# Patient Record
Sex: Male | Born: 1960 | Race: White | Hispanic: No | Marital: Single | Smoking: Never smoker
Health system: Southern US, Community
[De-identification: ages and names within clinical notes are randomized; demographics above are authoritative.]

## PROBLEM LIST (undated history)

## (undated) DIAGNOSIS — G4730 Sleep apnea, unspecified: Secondary | ICD-10-CM

## (undated) DIAGNOSIS — I10 Essential (primary) hypertension: Secondary | ICD-10-CM

## (undated) DIAGNOSIS — I509 Heart failure, unspecified: Secondary | ICD-10-CM

## (undated) DIAGNOSIS — N189 Chronic kidney disease, unspecified: Secondary | ICD-10-CM

---

## 2017-09-01 ENCOUNTER — Encounter (HOSPITAL_COMMUNITY): Payer: Self-pay | Admitting: Emergency Medicine

## 2017-09-01 ENCOUNTER — Emergency Department (HOSPITAL_COMMUNITY)
Admission: EM | Admit: 2017-09-01 | Discharge: 2017-09-01 | Disposition: A | Discharge status: Home / Self Care | Payer: BLUE CROSS/BLUE SHIELD | Attending: Emergency Medicine | Admitting: Emergency Medicine

## 2017-09-01 DIAGNOSIS — R040 Epistaxis: Secondary | ICD-10-CM | POA: Diagnosis present

## 2017-09-01 LAB — BASIC METABOLIC PANEL
Anion gap: 10 (ref 5–15)
BUN: 35 mg/dL — ABNORMAL HIGH (ref 6–20)
CHLORIDE: 103 mmol/L (ref 101–111)
CO2: 24 mmol/L (ref 22–32)
CREATININE: 1.26 mg/dL — AB (ref 0.61–1.24)
Calcium: 8.2 mg/dL — ABNORMAL LOW (ref 8.9–10.3)
GFR calc non Af Amer: >60 mL/min (ref >60)
GLUCOSE: 140 mg/dL — AB (ref 65–99)
Potassium: 4.4 mmol/L (ref 3.5–5.1)
Sodium: 137 mmol/L (ref 135–145)

## 2017-09-01 LAB — CBC WITH DIFFERENTIAL/PLATELET
Basophils Absolute: 0.1 10*3/uL (ref 0.0–0.1)
Basophils Relative: 1 %
EOS ABS: 0.2 10*3/uL (ref 0.0–0.7)
Eosinophils Relative: 2 %
HEMATOCRIT: 39.3 % (ref 39.0–52.0)
HEMOGLOBIN: 13.1 g/dL (ref 13.0–17.0)
LYMPHS ABS: 1 10*3/uL (ref 0.7–4.0)
Lymphocytes Relative: 11 %
MCH: 29.6 pg (ref 26.0–34.0)
MCHC: 33.3 g/dL (ref 30.0–36.0)
MCV: 88.9 fL (ref 78.0–100.0)
MONO ABS: 0.4 10*3/uL (ref 0.1–1.0)
MONOS PCT: 4 %
NEUTROS ABS: 7.7 10*3/uL (ref 1.7–7.7)
NEUTROS PCT: 82 %
Platelets: 193 10*3/uL (ref 150–400)
RBC: 4.42 MIL/uL (ref 4.22–5.81)
RDW: 14.1 % (ref 11.5–15.5)
WBC: 9.3 10*3/uL (ref 4.0–10.5)

## 2017-09-01 LAB — PROTIME-INR
INR: 1.05
Prothrombin Time: 13.6 seconds (ref 11.4–15.2)

## 2017-09-01 MED ORDER — CEPHALEXIN 500 MG PO CAPS
500.0000 mg | ORAL_CAPSULE | Freq: Two times a day (BID) | ORAL | 0 refills | Status: DC
Start: 2017-09-01 — End: 2019-05-03

## 2017-09-01 MED ORDER — LIDOCAINE-EPINEPHRINE (PF) 2 %-1:200000 IJ SOLN
10.0000 mL | Freq: Once | INTRAMUSCULAR | Status: AC
  Administered 2017-09-01: 10 mL
  Filled 2017-09-01: qty 20

## 2017-09-01 NOTE — ED Triage Notes (Addendum)
Patient arrived with EMS from home reports epistaxis this morning 2am , denies nasal injury / no anticoagulation , actively bleeding at arrival with blood clots at nares and mouth . Pt. stated 3 alcoholic drinks last night.

## 2017-09-01 NOTE — ED Notes (Signed)
Pt verbalizes understanding of d/c instructions. Pt received prescriptions. Pt ambulatory at d/c with all belongings.  

## 2017-09-01 NOTE — ED Provider Notes (Signed)
[place] EMERGENCY DEPARTMENT Provider Note   CSN: [id number] Arrival date & time: 09/01/17  0353     History   Chief Complaint Chief Complaint  Patient presents with  . Epistaxis    HPI Aaron Hale is a 56 y.o. male.  The history is provided by the patient and a significant other.  Epistaxis   This is a new problem. The current episode started 2 days ago. The problem occurs daily. The problem has been rapidly worsening. The problem is associated with an unknown factor. The bleeding has been from both nares. He has tried nothing for the symptoms.   Patient arrives via EMS for epistaxis that began 2 days ago but worsened tonight He has never had this before He does not take anticoagulants He reports feeling dizzy recently, no other acute complaints   PMH -none Home Medications    Prior to Admission medications   Medication Sig Start Date End Date Taking? Authorizing Provider  cephALEXin (KEFLEX) 500 MG capsule Take 1 capsule (500 mg total) by mouth 2 (two) times daily. 09/01/17   Ripley Fraise, MD    Family History No family history on file.  Social History Social History   Tobacco Use  . Smoking status: Never Smoker  . Smokeless tobacco: Never Used  Substance Use Topics  . Alcohol use: Yes  . Drug use: No     Allergies   Patient has no known allergies.   Review of Systems Review of Systems  Constitutional: Negative for fever.  HENT: Positive for nosebleeds.   Gastrointestinal: Negative for vomiting.  Neurological: Positive for dizziness.  All other systems reviewed and are negative.    Physical Exam Updated Vital Signs BP (!) 168/121 (BP Location: Right Arm)   Pulse (!) 105   Temp (!) 97.2 F (36.2 C) (Temporal)   Resp 18   Ht 1.829 m (6')   Wt (!) 163.3 kg (360 lb)   SpO2 92%   BMI 48.82 kg/m   Physical Exam CONSTITUTIONAL: Disheveled HEAD: Normocephalic/atraumatic EYES: EOMI/PERRL ENMT: Mucous membranes  moist, patient has blood in both nares with copious amount of bleeding from right nare.  He also has large amount of blood in his mouth NECK: supple no meningeal signs LUNGS: no apparent distress ABDOMEN: soft, obese NEURO: Pt is awake/alert/appropriate, moves all extremitiesx4.  No facial droop.   EXTREMITIES:  full ROM SKIN: warm, color normal PSYCH: no abnormalities of mood noted, alert and oriented to situation   ED Treatments / Results  Labs (all labs ordered are listed, but only abnormal results are displayed) Labs Reviewed  BASIC METABOLIC PANEL - Abnormal; Notable for the following components:      Result Value   Glucose, Bld 140 (*)    BUN 35 (*)    Creatinine, Ser 1.26 (*)    Calcium 8.2 (*)    All other components within normal limits  CBC WITH DIFFERENTIAL/PLATELET  PROTIME-INR    EKG  EKG Interpretation None       Radiology No results found.  Procedures .Epistaxis Management Date/Time: 09/01/2017 5:45 AM Performed by: Ripley Fraise, MD Authorized by: Ripley Fraise, MD   Consent:    Consent obtained:  Verbal   Consent given by:  Patient Procedure details:    Treatment site:  R anterior and R posterior   Treatment method:  Anterior pack   Treatment complexity:  Extensive   Treatment episode: initial   Post-procedure details:    Assessment:  Bleeding stopped  Patient tolerance of procedure:  Tolerated well, no immediate complications Comments:     7.5 rapid Rhino rocket to treat potential anterior posterior bleed applied without any difficulty   (including critical care time)  Medications Ordered in ED Medications  lidocaine-EPINEPHrine (XYLOCAINE W/EPI) 2 %-1:200000 (PF) injection 10 mL (10 mLs Other Given 09/01/17 0453)     Initial Impression / Assessment and Plan / ED Course  I have reviewed the triage vital signs and the nursing notes.  Pertinent labs results that were available during my care of the patient were reviewed by me  and considered in my medical decision making (see chart for details).     Patient with significant amount of bleeding on my arrival to the room I applied lidocaine with epi and pressure immediately I suctioned the nose then applied Rhino Rocket The bleeding stopped almost immediately after application of right rapid Rhino rocket Blood was suctioned from left nare and no further bleeding from that side Patient now is awake alert no distress no active bleeding Labs Reassuring  Referred to ear nose and throat next week and will start on Keflex for antibiotic prophylaxis  Final Clinical Impressions(s) / ED Diagnoses   Final diagnoses:  Epistaxis    ED Discharge Orders        Ordered    cephALEXin (KEFLEX) 500 MG capsule  2 times daily     09/01/17 0649       Ripley Fraise, MD 09/01/17 [phone]

## 2017-09-01 NOTE — ED Notes (Signed)
Dr. Christy Gentles ( EDP) at bedside.

## 2017-09-01 NOTE — ED Notes (Signed)
ENT cart set up at bedside .

## 2017-09-06 ENCOUNTER — Ambulatory Visit (INDEPENDENT_AMBULATORY_CARE_PROVIDER_SITE_OTHER): Payer: BLUE CROSS/BLUE SHIELD | Admitting: Otolaryngology

## 2017-09-06 DIAGNOSIS — R040 Epistaxis: Secondary | ICD-10-CM | POA: Diagnosis not present

## 2019-05-03 ENCOUNTER — Emergency Department: Payer: Self-pay

## 2019-05-03 ENCOUNTER — Other Ambulatory Visit: Payer: Self-pay

## 2019-05-03 ENCOUNTER — Inpatient Hospital Stay: Payer: Self-pay

## 2019-05-03 ENCOUNTER — Encounter: Payer: Self-pay | Admitting: Emergency Medicine

## 2019-05-03 ENCOUNTER — Inpatient Hospital Stay
Admission: EM | Admit: 2019-05-03 | Discharge: 2019-05-06 | DRG: 871 | Disposition: A | Discharge status: Home Health | Payer: Self-pay | Attending: Internal Medicine | Admitting: Internal Medicine

## 2019-05-03 DIAGNOSIS — Z79899 Other long term (current) drug therapy: Secondary | ICD-10-CM

## 2019-05-03 DIAGNOSIS — Z6841 Body Mass Index (BMI) 40.0 and over, adult: Secondary | ICD-10-CM

## 2019-05-03 DIAGNOSIS — I110 Hypertensive heart disease with heart failure: Secondary | ICD-10-CM | POA: Diagnosis present

## 2019-05-03 DIAGNOSIS — Z20828 Contact with and (suspected) exposure to other viral communicable diseases: Secondary | ICD-10-CM | POA: Diagnosis present

## 2019-05-03 DIAGNOSIS — J9601 Acute respiratory failure with hypoxia: Secondary | ICD-10-CM | POA: Diagnosis present

## 2019-05-03 DIAGNOSIS — N179 Acute kidney failure, unspecified: Secondary | ICD-10-CM

## 2019-05-03 DIAGNOSIS — L03119 Cellulitis of unspecified part of limb: Secondary | ICD-10-CM

## 2019-05-03 DIAGNOSIS — L03115 Cellulitis of right lower limb: Secondary | ICD-10-CM | POA: Diagnosis present

## 2019-05-03 DIAGNOSIS — I5033 Acute on chronic diastolic (congestive) heart failure: Secondary | ICD-10-CM | POA: Diagnosis present

## 2019-05-03 DIAGNOSIS — A419 Sepsis, unspecified organism: Principal | ICD-10-CM

## 2019-05-03 DIAGNOSIS — I50811 Acute right heart failure: Secondary | ICD-10-CM

## 2019-05-03 DIAGNOSIS — L03116 Cellulitis of left lower limb: Secondary | ICD-10-CM | POA: Diagnosis present

## 2019-05-03 DIAGNOSIS — R609 Edema, unspecified: Secondary | ICD-10-CM

## 2019-05-03 DIAGNOSIS — Z8249 Family history of ischemic heart disease and other diseases of the circulatory system: Secondary | ICD-10-CM

## 2019-05-03 DIAGNOSIS — L0390 Cellulitis, unspecified: Secondary | ICD-10-CM

## 2019-05-03 HISTORY — DX: Heart failure, unspecified: I50.9

## 2019-05-03 HISTORY — DX: Essential (primary) hypertension: I10

## 2019-05-03 HISTORY — DX: Morbid (severe) obesity due to excess calories: E66.01

## 2019-05-03 LAB — COMPREHENSIVE METABOLIC PANEL
ALT: 29 U/L (ref 0–44)
AST: 25 U/L (ref 15–41)
Albumin: 3.7 g/dL (ref 3.5–5.0)
Alkaline Phosphatase: 42 U/L (ref 38–126)
Anion gap: 12 (ref 5–15)
BUN: 49 mg/dL — ABNORMAL HIGH (ref 6–20)
CO2: 25 mmol/L (ref 22–32)
Calcium: 8.7 mg/dL — ABNORMAL LOW (ref 8.9–10.3)
Chloride: 97 mmol/L — ABNORMAL LOW (ref 98–111)
Creatinine, Ser: 2.21 mg/dL — ABNORMAL HIGH (ref 0.61–1.24)
GFR calc Af Amer: 37 mL/min — ABNORMAL LOW (ref >60)
GFR calc non Af Amer: 32 mL/min — ABNORMAL LOW (ref >60)
Glucose, Bld: 123 mg/dL — ABNORMAL HIGH (ref 70–99)
Potassium: 4 mmol/L (ref 3.5–5.1)
Sodium: 134 mmol/L — ABNORMAL LOW (ref 135–145)
Total Bilirubin: 1 mg/dL (ref 0.3–1.2)
Total Protein: 8 g/dL (ref 6.5–8.1)

## 2019-05-03 LAB — CBC WITH DIFFERENTIAL/PLATELET
Abs Immature Granulocytes: 0.07 10*3/uL (ref 0.00–0.07)
Basophils Absolute: 0.1 10*3/uL (ref 0.0–0.1)
Basophils Relative: 1 %
Eosinophils Absolute: 0.1 10*3/uL (ref 0.0–0.5)
Eosinophils Relative: 0 %
HCT: 45.9 % (ref 39.0–52.0)
Hemoglobin: 15.1 g/dL (ref 13.0–17.0)
Immature Granulocytes: 1 %
Lymphocytes Relative: 6 %
Lymphs Abs: 0.8 10*3/uL (ref 0.7–4.0)
MCH: 28.2 pg (ref 26.0–34.0)
MCHC: 32.9 g/dL (ref 30.0–36.0)
MCV: 85.6 fL (ref 80.0–100.0)
Monocytes Absolute: 1.6 10*3/uL — ABNORMAL HIGH (ref 0.1–1.0)
Monocytes Relative: 11 %
Neutro Abs: 12 10*3/uL — ABNORMAL HIGH (ref 1.7–7.7)
Neutrophils Relative %: 81 %
Platelets: 198 10*3/uL (ref 150–400)
RBC: 5.36 MIL/uL (ref 4.22–5.81)
RDW: 14.8 % (ref 11.5–15.5)
WBC: 14.7 10*3/uL — ABNORMAL HIGH (ref 4.0–10.5)
nRBC: 0 % (ref 0.0–0.2)

## 2019-05-03 LAB — SARS CORONAVIRUS 2 BY RT PCR (HOSPITAL ORDER, PERFORMED IN ~~LOC~~ HOSPITAL LAB): SARS Coronavirus 2: NEGATIVE

## 2019-05-03 LAB — TROPONIN I (HIGH SENSITIVITY)
Troponin I (High Sensitivity): 24 ng/L — ABNORMAL HIGH (ref <18)
Troponin I (High Sensitivity): 22 ng/L — ABNORMAL HIGH (ref <18)

## 2019-05-03 LAB — LACTIC ACID, PLASMA
Lactic Acid, Venous: 1.1 mmol/L (ref 0.5–1.9)
Lactic Acid, Venous: 1.1 mmol/L (ref 0.5–1.9)

## 2019-05-03 LAB — BRAIN NATRIURETIC PEPTIDE: B Natriuretic Peptide: 26 pg/mL (ref 0.0–100.0)

## 2019-05-03 MED ORDER — SODIUM CHLORIDE 0.9 % IV SOLN
2.0000 g | Freq: Three times a day (TID) | INTRAVENOUS | Status: DC
  Administered 2019-05-04 – 2019-05-05 (×4): 2 g via INTRAVENOUS
  Filled 2019-05-03 (×6): qty 2

## 2019-05-03 MED ORDER — VANCOMYCIN VARIABLE DOSE PER UNSTABLE RENAL FUNCTION (PHARMACIST DOSING): Status: DC

## 2019-05-03 MED ORDER — VANCOMYCIN HCL IN DEXTROSE 1-5 GM/200ML-% IV SOLN
1000.0000 mg | Freq: Once | INTRAVENOUS | Status: AC
  Administered 2019-05-03: 1000 mg via INTRAVENOUS
  Filled 2019-05-03: qty 200

## 2019-05-03 MED ORDER — ACETAMINOPHEN 500 MG PO TABS
1000.0000 mg | ORAL_TABLET | Freq: Once | ORAL | Status: AC
  Administered 2019-05-03: 1000 mg via ORAL
  Filled 2019-05-03: qty 2

## 2019-05-03 MED ORDER — METRONIDAZOLE IN NACL 5-0.79 MG/ML-% IV SOLN
500.0000 mg | Freq: Once | INTRAVENOUS | Status: AC
  Administered 2019-05-03: 500 mg via INTRAVENOUS
  Filled 2019-05-03: qty 100

## 2019-05-03 MED ORDER — SODIUM CHLORIDE 0.9 % IV BOLUS
1000.0000 mL | Freq: Once | INTRAVENOUS | Status: AC
  Administered 2019-05-03: 1000 mL via INTRAVENOUS

## 2019-05-03 MED ORDER — HEPARIN SODIUM (PORCINE) 5000 UNIT/ML IJ SOLN
5000.0000 [IU] | Freq: Three times a day (TID) | INTRAMUSCULAR | Status: DC
  Administered 2019-05-03 – 2019-05-06 (×9): 5000 [IU] via SUBCUTANEOUS
  Filled 2019-05-03 (×10): qty 1

## 2019-05-03 MED ORDER — SODIUM CHLORIDE 0.9 % IV SOLN
2.0000 g | Freq: Once | INTRAVENOUS | Status: AC
  Administered 2019-05-03: 2 g via INTRAVENOUS
  Filled 2019-05-03: qty 2

## 2019-05-03 MED ORDER — OXYCODONE HCL 5 MG PO TABS
5.0000 mg | ORAL_TABLET | Freq: Once | ORAL | Status: AC
  Administered 2019-05-03: 5 mg via ORAL
  Filled 2019-05-03: qty 1

## 2019-05-03 MED ORDER — SODIUM CHLORIDE 0.9 % IV SOLN: INTRAVENOUS | Status: DC

## 2019-05-03 MED ORDER — VANCOMYCIN HCL IN DEXTROSE 1-5 GM/200ML-% IV SOLN
1000.0000 mg | Freq: Once | INTRAVENOUS | Status: DC
  Filled 2019-05-03: qty 200

## 2019-05-03 MED ORDER — DOCUSATE SODIUM 100 MG PO CAPS: 100.0000 mg | ORAL_CAPSULE | Freq: Two times a day (BID) | ORAL | Status: DC | PRN

## 2019-05-03 NOTE — ED Triage Notes (Signed)
Bilateral leg redness and swelling, weeping skin, x 1 month. Worse on R. States was treated with oral antibiotics by PMD however has continued to get worse.

## 2019-05-03 NOTE — Consult Note (Signed)
Pharmacy Antibiotic Note  Aaron Hale is a 58 y.o. male admitted on 05/03/2019 with sepsis due to cellulits.  Pharmacy has been consulted for Vancomycin and Cefepime dosing. Unable to determine if patient has acute AKI vs CKD.   Plan: 1) Patient received 1g Vancomycin IV in ED. Will order additional 1g to be given for a total loading dose of 2g. Will hold off on supplemental orders currently and order a Vancomycin random level @ 2000, as well as Scr level with AM labs to determine if patient's renal function has improved.   2) Cefepime 2g Q8 hours.  Height: 6' (182.9 cm) Weight: (!) 411 lb (186.4 kg) IBW/kg (Calculated) : 77.6  Temp (24hrs), Avg:99.5 F (37.5 C), Min:98.9 F (37.2 C), Max:100.1 F (37.8 C)  Recent Labs  Lab 05/03/19 1422 05/03/19 1644 05/03/19 1834  WBC 14.7*  --   --   CREATININE 2.21*  --   --   LATICACIDVEN  --  1.1 1.1    Estimated Creatinine Clearance: 62.4 mL/min (A) (by C-G formula based on SCr of 2.21 mg/dL (H)).    No Known Allergies  Antimicrobials this admission: Cefepime 8/29 >>  Vancomycin 8/29 >>    Microbiology results: 8/29 BCx: pending   Thank you for allowing pharmacy to be a part of this patient's care.  Lance Coon A Tanza Pellot 05/03/2019 7:15 PM

## 2019-05-03 NOTE — Progress Notes (Signed)
CODE SEPSIS - PHARMACY COMMUNICATION  **Broad Spectrum Antibiotics should be administered within 1 hour of Sepsis diagnosis**  Time Code Sepsis Called/Page Received: 1627  Antibiotics Ordered: Cefepime/Metronidazole/Vancomycin  Time of 1st antibiotic administration: [id number]  Additional action taken by pharmacy: none  If necessary, Name of Provider/Nurse Contacted: N/A    Pearla Dubonnet ,PharmD Clinical Pharmacist  05/03/2019  5:56 PM

## 2019-05-03 NOTE — H&P (Addendum)
[facility] NAME: Aaron Hale    MR#:  [id number]  DATE OF BIRTH:  [birth year]  DATE OF ADMISSION:  05/03/2019  PRIMARY CARE PHYSICIAN: Patient, [name]   REQUESTING/REFERRING PHYSICIAN: Monk  CHIEF COMPLAINT:   Chief Complaint  Patient presents with  . Leg Swelling    HISTORY OF PRESENT ILLNESS: Aaron Hale  is a 58 y.o. male with a known history of hypertension and obesity-had some swelling and redness on legs and was seen by primary care physician 3 to 4 weeks ago where he was given oral cephalexin for 10 days which she finished 1 week ago.  Antibiotics helped to decrease some of the redness and swelling but it did not completely disappear. Now is redness again started getting worse and spreading over his leg up to his lower thighs and it is also tender on movement so he decided to come to emergency room.  He was noted to have high white cell count and borderline blood pressure with some tachycardia, so started on broad-spectrum antibiotics and cultures are sent and given to hospitalist team for admission. After I saw the patient I received a call from emergency room nurse that so far patient was on room air but now he suddenly requiring 2 to 3 L of oxygen, and blood pressure is in 0000000 systolic.  His renal function is much worse compared to the past what we had here. With all this findings I decided to also get ultrasound of bilateral legs as we cannot do CT chest with contrast. His COVID-19 test is still pending  PAST MEDICAL HISTORY:   Past Medical History:  Diagnosis Date  . Hypertension     PAST SURGICAL HISTORY: History reviewed. No pertinent surgical history.  SOCIAL HISTORY:  Social History   Tobacco Use  . Smoking status: Never Smoker  . Smokeless tobacco: Never Used  Substance Use Topics  . Alcohol use: Yes    FAMILY HISTORY:  Family History  Problem Relation Age of Onset  . CAD Mother     DRUG ALLERGIES:  No Known Allergies  REVIEW OF SYSTEMS:   CONSTITUTIONAL: No fever, fatigue or weakness.  EYES: No blurred or double vision.  EARS, NOSE, AND THROAT: No tinnitus or ear pain.  RESPIRATORY: No cough, some shortness of breath, wheezing or hemoptysis.  CARDIOVASCULAR: No chest pain, orthopnea, edema.  GASTROINTESTINAL: No nausea, vomiting, diarrhea or abdominal pain.  GENITOURINARY: No dysuria, hematuria.  ENDOCRINE: No polyuria, nocturia,  HEMATOLOGY: No anemia, easy bruising or bleeding SKIN: No rash or lesion. MUSCULOSKELETAL: No joint pain or arthritis.  Swelling on both legs with redness and pain more on the right leg. NEUROLOGIC: No tingling, numbness, weakness.  PSYCHIATRY: No anxiety or depression.   MEDICATIONS AT HOME:  Prior to Admission medications   Medication Sig Start Date End Date Taking? Authorizing Provider  amLODipine (NORVASC) 10 MG tablet Take 10 mg by mouth daily. 02/27/19  Yes [provider]  clotrimazole-betamethasone (LOTRISONE) cream Apply 1 application topically 2 (two) times daily. 04/24/19  Yes [provider]  hydrochlorothiazide (HYDRODIURIL) 25 MG tablet Take 25 mg by mouth daily. 03/06/19  Yes [provider]  losartan (COZAAR) 50 MG tablet Take 50 mg by mouth daily. 04/18/19  Yes [provider]      PHYSICAL EXAMINATION:   VITAL SIGNS: Blood pressure (!) 91/59, pulse 96, temperature 98.9 F (37.2 C), temperature source Oral, resp. rate 17, height 6' (1.829  m), weight (!) 186.4 kg, SpO2 (!) 83 %.  GENERAL:  58 y.o.-year-old morbidly obese patient lying in the bed with no acute distress.  EYES: Pupils equal, round, reactive to light and accommodation. No scleral icterus. Extraocular muscles intact.  HEENT: Head atraumatic, normocephalic. Oropharynx and nasopharynx clear.  NECK:  Supple, no jugular venous distention. No thyroid enlargement, no tenderness.  LUNGS: Normal breath sounds bilaterally, no wheezing,  rales,rhonchi or crepitation. No use of accessory muscles of respiration.  CARDIOVASCULAR: S1, S2 normal. No murmurs, rubs, or gallops.  ABDOMEN: Soft, nontender, nondistended. Bowel sounds present. No organomegaly or mass.  EXTREMITIES: He has chronic swelling on both legs with significant redness on right leg which is spreading up to his knee and some redness on left on his feet and lower leg.  No cyanosis, or clubbing.       NEUROLOGIC: Cranial nerves II through XII are intact. Muscle strength 5/5 in all extremities. Sensation intact. Gait not checked.  PSYCHIATRIC: The patient is alert and oriented x 3.  SKIN: No obvious rash, lesion, or ulcer.   LABORATORY PANEL:   CBC Recent Labs  Lab 05/03/19 1422  WBC 14.7*  HGB 15.1  HCT 45.9  PLT 198  MCV 85.6  MCH 28.2  MCHC 32.9  RDW 14.8  LYMPHSABS 0.8  MONOABS 1.6*  EOSABS 0.1  BASOSABS 0.1   ------------------------------------------------------------------------------------------------------------------  Chemistries  Recent Labs  Lab 05/03/19 1422  NA 134*  K 4.0  CL 97*  CO2 25  GLUCOSE 123*  BUN 49*  CREATININE 2.21*  CALCIUM 8.7*  AST 25  ALT 29  ALKPHOS 42  BILITOT 1.0   ------------------------------------------------------------------------------------------------------------------ estimated creatinine clearance is 62.4 mL/min (A) (by C-G formula based on SCr of 2.21 mg/dL (H)). ------------------------------------------------------------------------------------------------------------------ No results for input(s): TSH, T4TOTAL, T3FREE, THYROIDAB in the last 72 hours.  Invalid input(s): FREET3   Coagulation profile No results for input(s): INR, PROTIME in the last 168 hours. ------------------------------------------------------------------------------------------------------------------- No results for input(s): DDIMER in the last 72  hours. -------------------------------------------------------------------------------------------------------------------  Cardiac Enzymes No results for input(s): CKMB, TROPONINI, MYOGLOBIN in the last 168 hours.  Invalid input(s): CK ------------------------------------------------------------------------------------------------------------------ Invalid input(s): POCBNP  ---------------------------------------------------------------------------------------------------------------  Urinalysis No results found for: COLORURINE, APPEARANCEUR, LABSPEC, PHURINE, GLUCOSEU, HGBUR, BILIRUBINUR, KETONESUR, PROTEINUR, UROBILINOGEN, NITRITE, LEUKOCYTESUR   RADIOLOGY: No results found.  EKG: Orders placed or performed during the hospital encounter of 05/03/19  . ED EKG 12-Lead  . ED EKG 12-Lead  . EKG 12-Lead  . EKG 12-Lead    IMPRESSION AND PLAN:  *Sepsis -due to cellulitis Failed outpatient treatment with cephalexin.  Sent blood cultures by ER physician. Currently will give Vanco and cefepime for broad-spectrum coverage. Once the cultures are reported and his sepsis is resolved we can taper down antibiotics to maybe Unasyn. IV fluids.  *Acute renal failure Renal function significantly worse compared to past Avoid nephrotoxic medication and give IV fluids. Monitor renal function.  *Hypotension Blood pressure is borderline so I will hold all antihypertensive medications currently.  *Leg swelling This seems to be chronic lymphatic swelling as he is morbidly obese But he has redness and pain now so I would like to also rule out DVT, ordered ultra venous Doppler.  *Hypoxia Now have hypoxia on 2 to 3 L requirement of oxygen. Awaited chest x-ray. Awaited COVID-19 results. If there is no acute finding on chest x-ray then there is high probability of pulmonary embolism but due to acute renal failure cannot give CT scan with contrast so we might  have to do a VQ scan.  All the  records are reviewed and case discussed with ED provider. Management plans discussed with the patient, family and they are in agreement.  CODE STATUS: Full.    TOTAL TIME TAKING CARE OF THIS PATIENT: 50 minutes.    Vaughan Basta M.D on 05/03/2019   Between 7am to 6pm - Pager - [phone]  After 6pm go to [email or web address] - password [email or web address] [facility]  Office  [phone]  CC: Primary care physician; Patient, [name]   Note: This dictation was prepared with Dragon dictation along with smaller phrase technology. Any transcriptional errors that result from this process are unintentional.

## 2019-05-03 NOTE — Progress Notes (Signed)
Notified bedside nurse of need to draw lactic acid @ 1830.

## 2019-05-03 NOTE — ED Notes (Signed)
Family at bedside. 

## 2019-05-03 NOTE — ED Notes (Signed)
Admitting MD at bedside.

## 2019-05-03 NOTE — Progress Notes (Signed)
Reviewed updated labs.  Is COVID-19 test is negative. Chest x-ray reported cardiomegaly with some pulmonary edema.  Assessment and plan  Acute diastolic congestive heart failure Patient does not have cardiac history. Ordered echocardiogram. Cardiology consult. Follow serial troponin and BNP. Patient has acute renal failure so I would not give IV Lasix for now, but we should closely monitor his fluid status and renal function. He has already received 1 L IV fluid bolus by ER on arrival due to sepsis-so I will hold off on further IV fluids currently. Cardiologist is made aware by epic text message.

## 2019-05-03 NOTE — ED Notes (Signed)
Pt O2 stat 85-87% RA. BP 99/55 with bolus running. MD made aware. Pt placed on 2L 

## 2019-05-03 NOTE — ED Notes (Signed)
ED TO INPATIENT HANDOFF REPORT  ED Nurse Name and Phone #:  Kate/Sarah [phone]  S Name/Age/Gender Aaron Hale 58 y.o. male Room/Bed: ED04A/ED04A  Code Status   Code Status: Not on file  Home/SNF/Other Home Patient oriented to: self, place, time and situation Is this baseline? Yes   Triage Complete: Triage complete  Chief Complaint fungus infection through body  Triage Note Bilateral leg redness and swelling, weeping skin, x 1 month. Worse on R. States was treated with oral antibiotics by PMD however has continued to get worse.    Allergies No Known Allergies  Level of Care/Admitting Diagnosis ED Disposition    ED Disposition Condition [facility] Area: [place] [[id number]]  Level of Care: Med-Surg [16]  Covid Evaluation: Asymptomatic Screening Protocol (No Symptoms)  Diagnosis: Cellulitis [id number]  Admitting Physician: Vaughan Basta [id number]  Attending Physician: Vaughan Basta [phone]  Estimated length of stay: past midnight tomorrow  Certification:: I certify this patient will need inpatient services for at least 2 midnights  PT Class (Do Not Modify): Inpatient [101]  PT Acc Code (Do Not Modify): Private [1]       B Medical/Surgery History Past Medical History:  Diagnosis Date  . Hypertension    History reviewed. No pertinent surgical history.   A IV Location/Drains/Wounds Patient Lines/Drains/Airways Status   Active Line/Drains/Airways    Name:   Placement date:   Placement time:   Site:   Days:   Peripheral IV 05/03/19 Right Antecubital   05/03/19    1718    Antecubital   less than 1   Peripheral IV 05/03/19 Left Antecubital   05/03/19    1719    Antecubital   less than 1          Intake/Output Last 24 hours  Intake/Output Summary (Last 24 hours) at 05/03/2019 1833 Last data filed at 05/03/2019 1831 Gross per 24 hour  Intake 200 ml  Output -  Net 200 ml    Labs/Imaging Results for orders  placed or performed during the hospital encounter of 05/03/19 (from the past 48 hour(s))  CBC with Differential     Status: Abnormal   Collection Time: 05/03/19  2:22 PM  Result Value Ref Range   WBC 14.7 (H) 4.0 - 10.5 K/uL   RBC 5.36 4.22 - 5.81 MIL/uL   Hemoglobin 15.1 13.0 - 17.0 g/dL   HCT 45.9 39.0 - 52.0 %   MCV 85.6 80.0 - 100.0 fL   MCH 28.2 26.0 - 34.0 pg   MCHC 32.9 30.0 - 36.0 g/dL   RDW 14.8 11.5 - 15.5 %   Platelets 198 150 - 400 K/uL   nRBC 0.0 0.0 - 0.2 %   Neutrophils Relative % 81 %   Neutro Abs 12.0 (H) 1.7 - 7.7 K/uL   Lymphocytes Relative 6 %   Lymphs Abs 0.8 0.7 - 4.0 K/uL   Monocytes Relative 11 %   Monocytes Absolute 1.6 (H) 0.1 - 1.0 K/uL   Eosinophils Relative 0 %   Eosinophils Absolute 0.1 0.0 - 0.5 K/uL   Basophils Relative 1 %   Basophils Absolute 0.1 0.0 - 0.1 K/uL   Immature Granulocytes 1 %   Abs Immature Granulocytes 0.07 0.00 - 0.07 K/uL    Comment: Performed at [facility], [address], [place], [place] [address]  Comprehensive metabolic panel     Status: Abnormal   Collection Time: 05/03/19  2:22 PM  Result Value Ref Range  Sodium 134 (L) 135 - 145 mmol/L   Potassium 4.0 3.5 - 5.1 mmol/L   Chloride 97 (L) 98 - 111 mmol/L   CO2 25 22 - 32 mmol/L   Glucose, Bld 123 (H) 70 - 99 mg/dL   BUN 49 (H) 6 - 20 mg/dL   Creatinine, Ser 2.21 (H) 0.61 - 1.24 mg/dL   Calcium 8.7 (L) 8.9 - 10.3 mg/dL   Total Protein 8.0 6.5 - 8.1 g/dL   Albumin 3.7 3.5 - 5.0 g/dL   AST 25 15 - 41 U/L   ALT 29 0 - 44 U/L   Alkaline Phosphatase 42 38 - 126 U/L   Total Bilirubin 1.0 0.3 - 1.2 mg/dL   GFR calc non Af Amer 32 (L) >60 mL/min   GFR calc Af Amer 37 (L) >60 mL/min   Anion gap 12 5 - 15    Comment: Performed at [facility], [address] [place], [place] [address]  Lactic acid, plasma     Status: None   Collection Time: 05/03/19  4:44 PM  Result Value Ref Range   Lactic Acid, Venous 1.1 0.5 - 1.9 mmol/L    Comment: Performed at  [facility], [address] [place], [place] [address]  SARS Coronavirus 2 [facility] order, Performed in [facility] hospital lab) Nasopharyngeal Nasopharyngeal Swab     Status: None   Collection Time: 05/03/19  4:45 PM   Specimen: Nasopharyngeal Swab  Result Value Ref Range   SARS Coronavirus 2 NEGATIVE NEGATIVE    Comment: (NOTE) If result is NEGATIVE SARS-CoV-2 target nucleic acids are NOT DETECTED. The SARS-CoV-2 RNA is generally detectable in upper and lower  respiratory specimens during the acute phase of infection. The lowest  concentration of SARS-CoV-2 viral copies this assay can detect is 250  copies / mL. A negative result does not preclude SARS-CoV-2 infection  and should not be used as the sole basis for treatment or other  patient management decisions.  A negative result may occur with  improper specimen collection / handling, submission of specimen other  than nasopharyngeal swab, presence of viral mutation(s) within the  areas targeted by this assay, and inadequate number of viral copies  (<250 copies / mL). A negative result must be combined with clinical  observations, patient history, and epidemiological information. If result is POSITIVE SARS-CoV-2 target nucleic acids are DETECTED. The SARS-CoV-2 RNA is generally detectable in upper and lower  respiratory specimens dur ing the acute phase of infection.  Positive  results are indicative of active infection with SARS-CoV-2.  Clinical  correlation with patient history and other diagnostic information is  necessary to determine patient infection status.  Positive results do  not rule out bacterial infection or co-infection with other viruses. If result is PRESUMPTIVE POSTIVE SARS-CoV-2 nucleic acids MAY BE PRESENT.   A presumptive positive result was obtained on the submitted specimen  and confirmed on repeat testing.  While 2019 novel coronavirus  (SARS-CoV-2) nucleic acids may be present in the  submitted sample  additional confirmatory testing may be necessary for epidemiological  and / or clinical management purposes  to differentiate between  SARS-CoV-2 and other Sarbecovirus currently known to infect humans.  If clinically indicated additional testing with an alternate test  methodology [phone]) is advised. The SARS-CoV-2 RNA is generally  detectable in upper and lower respiratory sp ecimens during the acute  phase of infection. The expected result is Negative. Fact Sheet for Patients:  StrictlyIdeas.no Fact Sheet  for Healthcare Providers: BankingDealers.co.za This test is not yet approved or cleared by the [place] and has been authorized for detection and/or diagnosis of SARS-CoV-2 by FDA under an Emergency Use Authorization (EUA).  This EUA will remain in effect (meaning this test can be used) for the duration of the COVID-19 declaration under Section 564(b)(1) of the Act, 21 U.S.C. section 360bbb-3(b)(1), unless the authorization is terminated or revoked sooner. Performed at [facility], [address] [place], [place] [address]    No results found.  Pending Labs Unresulted Labs (From admission, onward)    Start     Ordered   05/03/19 1618  Lactic acid, plasma  Now then every 2 hours,   STAT     05/03/19 1619   05/03/19 1618  Blood Culture (routine x 2)  BLOOD CULTURE X 2,   STAT     05/03/19 1619   05/03/19 1618  Urinalysis, Routine w reflex microscopic  ONCE - STAT,   STAT     05/03/19 1619   05/03/19 1618  Urine culture  ONCE - STAT,   STAT     05/03/19 1619   Signed and Held  HIV antibody (Routine Testing)  Once,   R     Signed and Held   Signed and Held  Basic metabolic panel  Tomorrow morning,   R     Signed and Held   Signed and Held  CBC  Tomorrow morning,   R     Signed and Held   Signed and Held  CBC  (heparin)  Once,   R    Comments: Baseline for heparin therapy IF NOT ALREADY  DRAWN.  Notify MD if PLT < 100 K.    Signed and Held   Signed and Held  Creatinine, serum  (heparin)  Once,   R    Comments: Baseline for heparin therapy IF NOT ALREADY DRAWN.    Signed and Held          Vitals/Pain Today's Vitals   05/03/19 1420 05/03/19 1712 05/03/19 1730 05/03/19 1800  BP:  112/87 121/74 (!) 91/59  Pulse:  98 (!) 102 96  Resp:  17    Temp:  98.9 F (37.2 C)    TempSrc:  Oral    SpO2:  94% 91% (!) 83%  Weight: (!) 186.4 kg     Height: 6' (1.829 m)     PainSc:  7       Isolation Precautions No active isolations  Medications Medications  ceFEPIme (MAXIPIME) 2 g in sodium chloride 0.9 % 100 mL IVPB (2 g Intravenous New Bag/Given 05/03/19 1811)  metroNIDAZOLE (FLAGYL) IVPB 500 mg (has no administration in time range)  vancomycin (VANCOCIN) IVPB 1000 mg/200 mL premix (has no administration in time range)  0.9 %  sodium chloride infusion (has no administration in time range)  vancomycin (VANCOCIN) IVPB 1000 mg/200 mL premix (0 mg Intravenous Stopped 05/03/19 1831)  sodium chloride 0.9 % bolus 1,000 mL (1,000 mLs Intravenous New Bag/Given 05/03/19 1718)  acetaminophen (TYLENOL) tablet 1,000 mg (1,000 mg Oral Given 05/03/19 1713)  oxyCODONE (Oxy IR/ROXICODONE) immediate release tablet 5 mg (5 mg Oral Given 05/03/19 1714)    Mobility walks Low fall risk   Focused Assessments   R Recommendations: See Admitting Provider Note  Report given to:   Additional Notes:

## 2019-05-03 NOTE — Progress Notes (Signed)
PHARMACY -  BRIEF ANTIBIOTIC NOTE   Pharmacy has received consult(s) for vancomycin and cefepime from an ED provider.  The patient's profile has been reviewed for ht/wt/allergies/indication/available labs.    One time order(s) placed for Vancomycin 1g x 1; will order additional vancomycin 1g for total of vancomycin 2g.   Cefepime 2g IC x 1. Metronidazole 500mg  IV x 1.   Further antibiotics/pharmacy consults should be ordered by admitting physician if indicated.                       Thank you, Yeny Schmoll L 05/03/2019  4:30 PM

## 2019-05-03 NOTE — ED Provider Notes (Signed)
[facility] Emergency Department Provider Note  ____________________________________________   First MD Initiated Contact with Patient 05/03/19 1607     (approximate)  I have reviewed the triage vital signs and the nursing notes.   HISTORY  Chief Complaint Leg Swelling    HPI Aaron Hale is a 58 y.o. male with hypertension who had bilateral leg weakness and weeping of his legs for 1 month.  Patient has been treated with oral antibiotics.  Patient was on Keflex.  Patient stopped this 1 week ago.  Since stopping the redness has continued to spread up his leg.  The leg feels warm.  He is having pain that is moderate, constant, nothing makes it better, nothing makes it worse.  He endorses subjective chills and fevers.  He denies abdominal pain, urinary symptoms, shortness of breath, chest pain.       Past Medical History:  Diagnosis Date   Hypertension     There are no active problems to display for this patient.   History reviewed. No pertinent surgical history.  Prior to Admission medications   Medication Sig Start Date End Date Taking? Authorizing Provider  cephALEXin (KEFLEX) 500 MG capsule Take 1 capsule (500 mg total) by mouth 2 (two) times daily. 09/01/17   Ripley Fraise, MD    Allergies Patient has no known allergies.  No family history on file.  Social History Social History   Tobacco Use   Smoking status: Never Smoker   Smokeless tobacco: Never Used  Substance Use Topics   Alcohol use: Yes   Drug use: No      Review of Systems Constitutional: Positive fever and chills Eyes: No visual changes. ENT: No sore throat. Cardiovascular: Denies chest pain. Respiratory: Denies shortness of breath. Gastrointestinal: No abdominal pain.  No nausea, no vomiting.  No diarrhea.  No constipation. Genitourinary: Negative for dysuria. Musculoskeletal: Positive leg pain and redness Skin: Negative for rash. Neurological: Negative  for headaches, focal weakness or numbness. All other ROS negative ____________________________________________   PHYSICAL EXAM:  VITAL SIGNS: ED Triage Vitals  Enc Vitals Group     BP 05/03/19 1406 131/80     Pulse Rate 05/03/19 1406 (!) 111     Resp 05/03/19 1406 18     Temp 05/03/19 1406 100.1 F (37.8 C)     Temp Source 05/03/19 1406 Oral     SpO2 05/03/19 1406 92 %     Weight 05/03/19 1408 (!) 411 lb (186.4 kg)     Height 05/03/19 1408 6' (1.829 m)     Head Circumference --      Peak Flow --      Pain Score 05/03/19 1419 7     Pain Loc --      Pain Edu? --      Excl. in [place]? --     Constitutional: Alert and oriented. Well appearing and in no acute distress. Eyes: Conjunctivae are normal. EOMI. Head: Atraumatic. Nose: No congestion/rhinnorhea. Mouth/Throat: Mucous membranes are moist.   Neck: No stridor. Trachea Midline. FROM Cardiovascular: Tachycardic, regular rhythm. Grossly normal heart sounds.  Good peripheral circulation. Respiratory: Normal respiratory effort.  No retractions. Lungs CTAB. Gastrointestinal: Soft and nontender. No distention. No abdominal bruits.  Musculoskeletal: Chronic venous stasis changes in the bilateral legs with erythema bilaterally but worse on the right extending up past the knee.  No crepitus felt Neurologic:  Normal speech and language. No gross focal neurologic deficits are appreciated.  Skin:  Skin is warm, dry  and intact. No rash noted. Psychiatric: Mood and affect are normal. Speech and behavior are normal. GU: Deferred   ____________________________________________   LABS (all labs ordered are listed, but only abnormal results are displayed)  Labs Reviewed  CBC WITH DIFFERENTIAL/PLATELET - Abnormal; Notable for the following components:      Result Value   WBC 14.7 (*)    Neutro Abs 12.0 (*)    Monocytes Absolute 1.6 (*)    All other components within normal limits  COMPREHENSIVE METABOLIC PANEL - Abnormal; Notable for the  following components:   Sodium 134 (*)    Chloride 97 (*)    Glucose, Bld 123 (*)    BUN 49 (*)    Creatinine, Ser 2.21 (*)    Calcium 8.7 (*)    GFR calc non Af Amer 32 (*)    GFR calc Af Amer 37 (*)    All other components within normal limits  SARS CORONAVIRUS 2 ([facility])  CULTURE, BLOOD (ROUTINE X 2)  CULTURE, BLOOD (ROUTINE X 2)  URINE CULTURE  LACTIC ACID, PLASMA  LACTIC ACID, PLASMA  URINALYSIS, ROUTINE W REFLEX MICROSCOPIC  BRAIN NATRIURETIC PEPTIDE  TROPONIN I (HIGH SENSITIVITY)   ____________________________________________   ED ECG REPORT I, Vanessa [place]vale, the attending physician, personally viewed and interpreted this ECG.  EKG shows normal sinus rate 93, no ST elevation, no T wave inversion, normal intervals ____________________________________________  RADIOLOGY Robert Bellow, personally viewed and evaluated these images (plain radiographs) as part of my medical decision making, as well as reviewing the written report by the radiologist.  ED MD interpretation: No pneumonia mild cardiomegaly with pulmonary vascular congestion  Official radiology report(s): Dg Chest Portable 1 View  Result Date: 05/03/2019 CLINICAL DATA:  Acute shortness of breath EXAM: PORTABLE CHEST 1 VIEW COMPARISON:  None. FINDINGS: Cardiomegaly and mild pulmonary vascular congestion noted. There is no evidence of focal airspace disease, pulmonary edema, suspicious pulmonary nodule/mass, pleural effusion, or pneumothorax. No acute bony abnormalities are identified. IMPRESSION: Cardiomegaly with mild pulmonary vascular congestion. Electronically Signed   By: Margarette Canada M.D.   On: 05/03/2019 18:33    ____________________________________________   PROCEDURES  Procedure(s) performed (including Critical Care):  .Critical Care Performed by: Vanessa [place]uyuna, MD Authorized by: Vanessa [place], MD   Critical care provider statement:    Critical  care time (minutes):  30   Critical care was time spent personally by me on the following activities:  Discussions with consultants, evaluation of patient's response to treatment, examination of patient, ordering and performing treatments and interventions, ordering and review of laboratory studies, ordering and review of radiographic studies, pulse oximetry, re-evaluation of patient's condition, obtaining history from patient or surrogate and review of old charts     ____________________________________________   INITIAL IMPRESSION / [place] / ED COURSE  Jeriel Hardwell was evaluated in Emergency Department on 05/03/2019 for the symptoms described in the history of present illness. He was evaluated in the context of the global COVID-19 pandemic, which necessitated consideration that the patient might be at risk for infection with the SARS-CoV-2 virus that causes COVID-19. Institutional protocols and algorithms that pertain to the evaluation of patients at risk for COVID-19 are in a state of rapid change based on information released by regulatory bodies including the CDC and federal and state organizations. These policies and algorithms were followed during the patient's care in the ED.     Patient presents  with 1 month of leg redness that is worsening over the past 1 week.  Patient is already on oral Keflex without resolution of symptoms.  Patient meets sepsis criteria with tachycardia and elevated white count.  Sepsis alert called.  Will cover patient broadly with antibiotics for now given the extensity of the cellulitis.  Patient does have some superficial chronic venous stasis changes.  Lower suspicion for abscess.  No crepitus and lower suspicion for necrotizing fasciitis.  Considered DVT but again less likely given legs are equal in size and denies any risk factors for this.  Patient also has a slightly elevated kidney function from baseline.  Will give patient fluid.  Will hold off on  full resuscitation with 30 mils per kilo given patient has edema in his legs.  Patient started to desat into the 88%.  Patient is asymptomatic.  EKG without evidence of STEMI.  Chest x-ray with some cardiomegaly pulmonary congestion.  Patient placed on 2 L.  Hospital team was notified.  Will hold off on additional fluid.  Coronavirus test is negative.  Patient to the hospital team for sepsis and cellulitis        ____________________________________________   FINAL CLINICAL IMPRESSION(S) / ED DIAGNOSES   Final diagnoses:  Cellulitis of lower extremity, unspecified laterality  Sepsis, due to unspecified organism, unspecified whether acute organ dysfunction present ([place])  AKI (acute kidney injury) ([place])      MEDICATIONS GIVEN DURING THIS VISIT:  Medications  metroNIDAZOLE (FLAGYL) IVPB 500 mg (500 mg Intravenous Transfusing/Transfer 05/03/19 1906)  vancomycin (VANCOCIN) IVPB 1000 mg/200 mL premix (1,000 mg Intravenous Not Given 05/03/19 1857)  ceFEPIme (MAXIPIME) 2 g in sodium chloride 0.9 % 100 mL IVPB (0 g Intravenous Stopped 05/03/19 1848)  vancomycin (VANCOCIN) IVPB 1000 mg/200 mL premix (0 mg Intravenous Stopped 05/03/19 1831)  sodium chloride 0.9 % bolus 1,000 mL (0 mLs Intravenous Stopped 05/03/19 1848)  acetaminophen (TYLENOL) tablet 1,000 mg (1,000 mg Oral Given 05/03/19 1713)  oxyCODONE (Oxy IR/ROXICODONE) immediate release tablet 5 mg (5 mg Oral Given 05/03/19 1714)     ED Discharge Orders    None       Note:  This document was prepared using Dragon voice recognition software and may include unintentional dictation errors.   Vanessa Kite, MD 05/03/19 Dorthula Perfect

## 2019-05-03 NOTE — Progress Notes (Signed)
Family Meeting Note  Advance Directive:yes  Today a meeting took place with the Patient and girl friend ( next of KIN/ POA).  The following clinical team members were present during this meeting:MD  The following were discussed:Patient's diagnosis: Cellulitis with failure of outpatient treatment, sepsis, hypotension, acute renal failure, Patient's progosis: Unable to determine and Goals for treatment: Full Code  Additional follow-up to be provided: PMD  Time spent during discussion:20 minutes  Vaughan Basta, MD

## 2019-05-03 NOTE — ED Notes (Signed)
Pt desatting into mid 52's. Placed on 2 L [place], came up to 91% and then went back down to 88%. Increased to 4 L [place], came up to 93%.

## 2019-05-03 NOTE — ED Notes (Signed)
This RN is unable to obtain 2nd set of cultures after multiple IV attempts. Will ask another nurse to attempt;

## 2019-05-04 ENCOUNTER — Inpatient Hospital Stay
Admit: 2019-05-04 | Discharge: 2019-05-04 | Disposition: A | Discharge status: Home / Self Care | Payer: Self-pay | Attending: Internal Medicine | Admitting: Internal Medicine

## 2019-05-04 LAB — URINALYSIS, ROUTINE W REFLEX MICROSCOPIC
Bilirubin Urine: NEGATIVE
Glucose, UA: NEGATIVE mg/dL
Ketones, ur: NEGATIVE mg/dL
Leukocytes,Ua: NEGATIVE
Nitrite: NEGATIVE
Protein, ur: NEGATIVE mg/dL
RBC / HPF: >50 RBC/hpf — ABNORMAL HIGH (ref 0–5)
Specific Gravity, Urine: 1.016 (ref 1.005–1.030)
pH: 5 (ref 5.0–8.0)

## 2019-05-04 LAB — BASIC METABOLIC PANEL
Anion gap: 11 (ref 5–15)
BUN: 47 mg/dL — ABNORMAL HIGH (ref 6–20)
CO2: 26 mmol/L (ref 22–32)
Calcium: 8.8 mg/dL — ABNORMAL LOW (ref 8.9–10.3)
Chloride: 99 mmol/L (ref 98–111)
Creatinine, Ser: 2 mg/dL — ABNORMAL HIGH (ref 0.61–1.24)
GFR calc Af Amer: 41 mL/min — ABNORMAL LOW (ref >60)
GFR calc non Af Amer: 36 mL/min — ABNORMAL LOW (ref >60)
Glucose, Bld: 123 mg/dL — ABNORMAL HIGH (ref 70–99)
Potassium: 4.3 mmol/L (ref 3.5–5.1)
Sodium: 136 mmol/L (ref 135–145)

## 2019-05-04 LAB — CBC
HCT: 44.9 % (ref 39.0–52.0)
Hemoglobin: 14.4 g/dL (ref 13.0–17.0)
MCH: 28.1 pg (ref 26.0–34.0)
MCHC: 32.1 g/dL (ref 30.0–36.0)
MCV: 87.5 fL (ref 80.0–100.0)
Platelets: 184 10*3/uL (ref 150–400)
RBC: 5.13 MIL/uL (ref 4.22–5.81)
RDW: 14.8 % (ref 11.5–15.5)
WBC: 14.2 10*3/uL — ABNORMAL HIGH (ref 4.0–10.5)
nRBC: 0 % (ref 0.0–0.2)

## 2019-05-04 LAB — ECHOCARDIOGRAM COMPLETE
Height: 72 in
Weight: 6560.89 oz

## 2019-05-04 MED ORDER — HYDRALAZINE HCL 20 MG/ML IJ SOLN: 10.0000 mg | Freq: Four times a day (QID) | INTRAMUSCULAR | Status: DC | PRN

## 2019-05-04 MED ORDER — ACETAMINOPHEN 325 MG PO TABS: 650.0000 mg | ORAL_TABLET | Freq: Four times a day (QID) | ORAL | Status: DC | PRN

## 2019-05-04 MED ORDER — PERFLUTREN LIPID MICROSPHERE
1.0000 mL | INTRAVENOUS | Status: AC | PRN
  Administered 2019-05-04: 3 mL via INTRAVENOUS
  Filled 2019-05-04: qty 10

## 2019-05-04 MED ORDER — MORPHINE SULFATE (PF) 2 MG/ML IV SOLN: 2.0000 mg | INTRAVENOUS | Status: DC | PRN

## 2019-05-04 MED ORDER — TRAMADOL HCL 50 MG PO TABS
50.0000 mg | ORAL_TABLET | Freq: Four times a day (QID) | ORAL | Status: DC | PRN
  Administered 2019-05-04 – 2019-05-06 (×4): 50 mg via ORAL
  Filled 2019-05-04 (×4): qty 1

## 2019-05-04 MED ORDER — FUROSEMIDE 10 MG/ML IJ SOLN
40.0000 mg | Freq: Two times a day (BID) | INTRAMUSCULAR | Status: DC
  Administered 2019-05-04: 40 mg via INTRAVENOUS
  Filled 2019-05-04: qty 4

## 2019-05-04 NOTE — Progress Notes (Signed)
[facility] [place] NAME: Aaron Hale    MR#:  [id number]  DATE OF BIRTH:  08-Dec-1960  SUBJECTIVE:   Patient here with bilateral lower extremity edema.  Patient reports he was treated with antibiotics for 10 days for lower extremity cellulitis however after some improvement in the cellulitis and swelling worsened and presented to the ER for further evaluation Denies PND, orthopnea. Denies wheezing, chills or fever.  Positive abdominal swelling and lower extremity swelling Denies chest pain REVIEW OF SYSTEMS:    Review of Systems  Constitutional: Negative for fever, chills weight loss HENT: Negative for ear pain, nosebleeds, congestion, facial swelling, rhinorrhea, neck pain, neck stiffness and ear discharge.   Respiratory: Negative for cough, ++shortness of breath, no wheezing  Cardiovascular: Negative for chest pain, palpitations and++ leg swelling.  Gastrointestinal: Negative for heartburn, abdominal pain, vomiting, diarrhea or consitpation Genitourinary: Negative for dysuria, urgency, frequency, hematuria Musculoskeletal: Negative for back pain or joint pain Neurological: Negative for dizziness, seizures, syncope, focal weakness,  numbness and headaches.  Hematological: Does not bruise/bleed easily.  Psychiatric/Behavioral: Negative for hallucinations, confusion, dysphoric mood    Tolerating Diet: yes      DRUG ALLERGIES:  No Known Allergies  VITALS:  Blood pressure 132/66, pulse 97, temperature 98.6 F (37 C), temperature source Oral, resp. rate 18, height 6' (1.829 m), weight (!) 186 kg, SpO2 93 %.  PHYSICAL EXAMINATION:  Constitutional: Appears morbidly obese no distress. HENT: Normocephalic. Marland Kitchen Oropharynx is clear and moist.  Eyes: Conjunctivae and EOM are normal. PERRLA, no scleral icterus.  Neck: Normal ROM. Neck supple. No JVD. No tracheal deviation. CVS: distant heart sounds RRR, S1/S2 +, no murmurs, no gallops, no  carotid bruit.  Pulmonary: Effort and breath sounds normal, no stridor, rhonchi, wheezes, rales.  Abdominal: Soft. BS +,  no distension, tenderness, rebound or guarding.  Musculoskeletal: Normal range of motion.4+ LEE to mid thigh With  tenderness.  Neuro: Alert. CN 2-12 grossly intact. No focal deficits. Skin: Skin is warm and dry. Red tawny b/l LEE Psychiatric: Normal mood and affect.      LABORATORY PANEL:   CBC Recent Labs  Lab 05/04/19 0422  WBC 14.2*  HGB 14.4  HCT 44.9  PLT 184   ------------------------------------------------------------------------------------------------------------------  Chemistries  Recent Labs  Lab 05/03/19 1422 05/04/19 0422  NA 134* 136  K 4.0 4.3  CL 97* 99  CO2 25 26  GLUCOSE 123* 123*  BUN 49* 47*  CREATININE 2.21* 2.00*  CALCIUM 8.7* 8.8*  AST 25  --   ALT 29  --   ALKPHOS 42  --   BILITOT 1.0  --    ------------------------------------------------------------------------------------------------------------------  Cardiac Enzymes No results for input(s): TROPONINI in the last 168 hours. ------------------------------------------------------------------------------------------------------------------  RADIOLOGY:  US Venous Img Lower Bilateral  Result Date: 05/03/2019 CLINICAL DATA:  58 year old male with bilateral lower extremity swelling and redness. EXAM: BILATERAL LOWER EXTREMITY VENOUS DOPPLER ULTRASOUND TECHNIQUE: Gray-scale sonography with graded compression, as well as color Doppler and duplex ultrasound were performed to evaluate the lower extremity deep venous systems from the level of the common femoral vein and including the common femoral, femoral, profunda femoral, popliteal and calf veins including the posterior tibial, peroneal and gastrocnemius veins when visible. The superficial great saphenous vein was also interrogated. Spectral Doppler was utilized to evaluate flow at rest and with distal augmentation  maneuvers in the common femoral, femoral and popliteal veins. COMPARISON:  None. FINDINGS: Evaluation is limited due to  patient's body habitus. RIGHT LOWER EXTREMITY Common Femoral Vein: No evidence of thrombus. Normal compressibility, respiratory phasicity and response to augmentation. Saphenofemoral Junction: No evidence of thrombus. Normal compressibility and flow on color Doppler imaging. Profunda Femoral Vein: No evidence of thrombus. Normal compressibility and flow on color Doppler imaging. Femoral Vein: No evidence of thrombus. Normal compressibility, respiratory phasicity and response to augmentation. Popliteal Vein: No evidence of thrombus. Normal compressibility, respiratory phasicity and response to augmentation. Calf Veins: The calf vessels are not well visualized. Superficial Great Saphenous Vein: No evidence of thrombus. Normal compressibility. Venous Reflux:  None. Other Findings:  None. LEFT LOWER EXTREMITY Common Femoral Vein: No evidence of thrombus. Normal compressibility, respiratory phasicity and response to augmentation. Saphenofemoral Junction: No evidence of thrombus. Normal compressibility and flow on color Doppler imaging. Profunda Femoral Vein: No evidence of thrombus. Normal compressibility and flow on color Doppler imaging. Femoral Vein: No evidence of thrombus. Normal compressibility, respiratory phasicity and response to augmentation. Popliteal Vein: No evidence of thrombus. Normal compressibility, respiratory phasicity and response to augmentation. Calf Veins: The calf veins are poorly visualized. Superficial Great Saphenous Vein: No evidence of thrombus. Normal compressibility. Venous Reflux:  None. Other Findings:  None. IMPRESSION: No definite evidence of deep venous thrombosis in either lower extremity. Electronically Signed   By: Anner Crete M.D.   On: 05/03/2019 22:39   Dg Chest Portable 1 View  Result Date: 05/03/2019 CLINICAL DATA:  Acute shortness of breath EXAM:  PORTABLE CHEST 1 VIEW COMPARISON:  None. FINDINGS: Cardiomegaly and mild pulmonary vascular congestion noted. There is no evidence of focal airspace disease, pulmonary edema, suspicious pulmonary nodule/mass, pleural effusion, or pneumothorax. No acute bony abnormalities are identified. IMPRESSION: Cardiomegaly with mild pulmonary vascular congestion. Electronically Signed   By: Margarette Canada M.D.   On: 05/03/2019 18:33     ASSESSMENT AND PLAN:    58 year old male with morbid obesity and hypertension who  was recently treated for lower extremity cellulitis for 10 days presents to the emergency room due to increased lower extremity swelling.  1.  Acute hypoxic respiratory failure with suggestion of acute pulmonary edema by chest x-ray and history. Start Lasix 40 IV twice daily Follow intake and output with daily weight Wean oxygen as tolerated Despite normal BNP clinical signs of right heart failure present on examination.  2.  Acute congestive heart failure likely diastolic: Follow-up on echo Lasix 40 IV twice daily Intake and output with daily weight Cardiology consultation appreciated  3.  Lower extremity cellulitis: I feel that this is more likely due to lower extremity edema rather than cellulitis. However I will continue cefepime and discontinue vancomycin Dopplers were negative for DVT Elevate legs Unna boots to help with lower extremity edema   4.  Morbid obesity: Patient encouraged to lose weight as tolerated.  5 AKI in the setting of CHF exacerbation Monitor creatinine while on Lasix Stop HCTZ  6.  Essential hypertension: Norvasc stop due to lower extremity edema.  HCTZ/losartan stopped due to acute kidney injury Continue PRN hydralazine Monitor blood pressure Consider adding Coreg following echo results.   Management plans discussed with the patient and  he is in agreement.  CODE STATUS: full  TOTAL TIME TAKING CARE OF THIS PATIENT: 33 minutes.     POSSIBLE D/C  2-4 days, DEPENDING ON CLINICAL CONDITION.   Bettey Costa M.D on 05/04/2019 at 10:14 AM  Between 7am to 6pm - Pager - [phone] After 6pm go to [email or web address] - [place]  [facility]  Office  [phone]  CC: Primary care physician; Patient, [name]  Note: This dictation was prepared with Dragon dictation along with smaller phrase technology. Any transcriptional errors that result from this process are unintentional.

## 2019-05-04 NOTE — Consult Note (Signed)
Aaron Hale is a 58 y.o. male  [id number]  Primary Cardiologist: Neoma Laming or Consultation: CHF  HPI: 58 year old white male presented to the hospital with shortness of breath and cellulitis of the legs associated with mildly elevated troponin and mildly elevated creatinine.   Review of Systems: Patient has leg swelling with erythema and shortness of breath   Past Medical History:  Diagnosis Date  . Hypertension     Medications Prior to Admission  Medication Sig Dispense Refill  . amLODipine (NORVASC) 10 MG tablet Take 10 mg by mouth daily.    . clotrimazole-betamethasone (LOTRISONE) cream Apply 1 application topically 2 (two) times daily.    . hydrochlorothiazide (HYDRODIURIL) 25 MG tablet Take 25 mg by mouth daily.    Marland Kitchen losartan (COZAAR) 50 MG tablet Take 50 mg by mouth daily.       . heparin  5,000 Units Subcutaneous Q8H  . vancomycin variable dose per unstable renal function (pharmacist dosing)   Does not apply See admin instructions    Infusions: . ceFEPime (MAXIPIME) IV Stopped (05/04/19 [id number])    No Known Allergies  Social History   Socioeconomic History  . Marital status: Single    Spouse name: Not on file  . Number of children: Not on file  . Years of education: Not on file  . Highest education level: Not on file  Occupational History  . Not on file  Social Needs  . Financial resource strain: Not on file  . Food insecurity    Worry: Not on file    Inability: Not on file  . Transportation needs    Medical: Not on file    Non-medical: Not on file  Tobacco Use  . Smoking status: Never Smoker  . Smokeless tobacco: Never Used  Substance and Sexual Activity  . Alcohol use: Yes  . Drug use: No  . Sexual activity: Not on file  Lifestyle  . Physical activity    Days per week: Not on file    Minutes per session: Not on file  . Stress: Not on file  Relationships  . Social Herbalist on phone: Not on file    Gets together: Not on file     Attends religious service: Not on file    Active member of club or organization: Not on file    Attends meetings of clubs or organizations: Not on file    Relationship status: Not on file  . Intimate partner violence    Fear of current or ex partner: Not on file    Emotionally abused: Not on file    Physically abused: Not on file    Forced sexual activity: Not on file  Other Topics Concern  . Not on file  Social History Narrative  . Not on file    Family History  Problem Relation Age of Onset  . CAD Mother     PHYSICAL EXAM: Vitals:   05/03/19 2012 05/04/19 0752  BP: 118/78 132/66  Pulse: 86 97  Resp: 18 18  Temp: 98.4 F (36.9 C) 98.6 F (37 C)  SpO2: 93% 93%     Intake/Output Summary (Last 24 hours) at 05/04/2019 0944 Last data filed at 05/04/2019 0736 Gross per 24 hour  Intake 1500 ml  Output 300 ml  Net 1200 ml    General:  Well appearing. No respiratory difficulty HEENT: normal Neck: supple. no JVD. Carotids 2+ bilat; no bruits. No lymphadenopathy or thryomegaly appreciated. Cor: PMI nondisplaced.  Regular rate & rhythm. No rubs, gallops or murmurs. Lungs: clear Abdomen: soft, nontender, nondistended. No hepatosplenomegaly. No bruits or masses. Good bowel sounds. Extremities: no cyanosis, clubbing, rash, edema Neuro: alert & oriented x 3, cranial nerves grossly intact. moves all 4 extremities w/o difficulty. Affect pleasant.  ECG: Sinus rhythm with poor R wave progression nonspecific interventricular conduction delay  Results for orders placed or performed during the hospital encounter of 05/03/19 (from the past 24 hour(s))  CBC with Differential     Status: Abnormal   Collection Time: 05/03/19  2:22 PM  Result Value Ref Range   WBC 14.7 (H) 4.0 - 10.5 K/uL   RBC 5.36 4.22 - 5.81 MIL/uL   Hemoglobin 15.1 13.0 - 17.0 g/dL   HCT 45.9 39.0 - 52.0 %   MCV 85.6 80.0 - 100.0 fL   MCH 28.2 26.0 - 34.0 pg   MCHC 32.9 30.0 - 36.0 g/dL   RDW 14.8 11.5 - 15.5  %   Platelets 198 150 - 400 K/uL   nRBC 0.0 0.0 - 0.2 %   Neutrophils Relative % 81 %   Neutro Abs 12.0 (H) 1.7 - 7.7 K/uL   Lymphocytes Relative 6 %   Lymphs Abs 0.8 0.7 - 4.0 K/uL   Monocytes Relative 11 %   Monocytes Absolute 1.6 (H) 0.1 - 1.0 K/uL   Eosinophils Relative 0 %   Eosinophils Absolute 0.1 0.0 - 0.5 K/uL   Basophils Relative 1 %   Basophils Absolute 0.1 0.0 - 0.1 K/uL   Immature Granulocytes 1 %   Abs Immature Granulocytes 0.07 0.00 - 0.07 K/uL  Comprehensive metabolic panel     Status: Abnormal   Collection Time: 05/03/19  2:22 PM  Result Value Ref Range   Sodium 134 (L) 135 - 145 mmol/L   Potassium 4.0 3.5 - 5.1 mmol/L   Chloride 97 (L) 98 - 111 mmol/L   CO2 25 22 - 32 mmol/L   Glucose, Bld 123 (H) 70 - 99 mg/dL   BUN 49 (H) 6 - 20 mg/dL   Creatinine, Ser 2.21 (H) 0.61 - 1.24 mg/dL   Calcium 8.7 (L) 8.9 - 10.3 mg/dL   Total Protein 8.0 6.5 - 8.1 g/dL   Albumin 3.7 3.5 - 5.0 g/dL   AST 25 15 - 41 U/L   ALT 29 0 - 44 U/L   Alkaline Phosphatase 42 38 - 126 U/L   Total Bilirubin 1.0 0.3 - 1.2 mg/dL   GFR calc non Af Amer 32 (L) >60 mL/min   GFR calc Af Amer 37 (L) >60 mL/min   Anion gap 12 5 - 15  Lactic acid, plasma     Status: None   Collection Time: 05/03/19  4:44 PM  Result Value Ref Range   Lactic Acid, Venous 1.1 0.5 - 1.9 mmol/L  Blood Culture (routine x 2)     Status: None (Preliminary result)   Collection Time: 05/03/19  4:44 PM   Specimen: BLOOD  Result Value Ref Range   Specimen Description BLOOD RIGHT ANTECUBITAL    Special Requests      BOTTLES DRAWN AEROBIC AND ANAEROBIC Blood Culture adequate volume   Culture      NO GROWTH < 24 HOURS Performed at [facility], [address] [place], [place] [address]    Report Status PENDING   Blood Culture (routine x 2)     Status: None (Preliminary result)   Collection Time: 05/03/19  4:45 PM   Specimen:  BLOOD  Result Value Ref Range   Specimen Description BLOOD LEFT ANTECUBITAL     Special Requests      BOTTLES DRAWN AEROBIC AND ANAEROBIC Blood Culture results may not be optimal due to an excessive volume of blood received in culture bottles   Culture      NO GROWTH < 24 HOURS Performed at [facility], [address] [place], [place] [address]    Report Status PENDING   SARS Coronavirus 2 [facility] order, Performed in [facility] hospital lab) Nasopharyngeal Nasopharyngeal Swab     Status: None   Collection Time: 05/03/19  4:45 PM   Specimen: Nasopharyngeal Swab  Result Value Ref Range   SARS Coronavirus 2 NEGATIVE NEGATIVE  Lactic acid, plasma     Status: None   Collection Time: 05/03/19  6:34 PM  Result Value Ref Range   Lactic Acid, Venous 1.1 0.5 - 1.9 mmol/L  Troponin I (High Sensitivity)     Status: Abnormal   Collection Time: 05/03/19  7:16 PM  Result Value Ref Range   Troponin I (High Sensitivity) 24 (H) <18 ng/L  Brain natriuretic peptide     Status: None   Collection Time: 05/03/19  7:16 PM  Result Value Ref Range   B Natriuretic Peptide 26.0 0.0 - 100.0 pg/mL  Troponin I (High Sensitivity)     Status: Abnormal   Collection Time: 05/03/19  9:13 PM  Result Value Ref Range   Troponin I (High Sensitivity) 22 (H) <18 ng/L  Urinalysis, Routine w reflex microscopic     Status: Abnormal   Collection Time: 05/04/19 12:34 AM  Result Value Ref Range   Color, Urine YELLOW (A) YELLOW   APPearance HAZY (A) CLEAR   Specific Gravity, Urine 1.016 1.005 - 1.030   pH 5.0 5.0 - 8.0   Glucose, UA NEGATIVE NEGATIVE mg/dL   Hgb urine dipstick MODERATE (A) NEGATIVE   Bilirubin Urine NEGATIVE NEGATIVE   Ketones, ur NEGATIVE NEGATIVE mg/dL   Protein, ur NEGATIVE NEGATIVE mg/dL   Nitrite NEGATIVE NEGATIVE   Leukocytes,Ua NEGATIVE NEGATIVE   RBC / HPF >50 (H) 0 - 5 RBC/hpf   WBC, UA 0-5 0 - 5 WBC/hpf   Bacteria, UA RARE (A) NONE SEEN   Squamous Epithelial / LPF 0-5 0 - 5   Mucus PRESENT    Hyaline Casts, UA PRESENT    Granular Casts, UA PRESENT    Basic metabolic panel     Status: Abnormal   Collection Time: 05/04/19  4:22 AM  Result Value Ref Range   Sodium 136 135 - 145 mmol/L   Potassium 4.3 3.5 - 5.1 mmol/L   Chloride 99 98 - 111 mmol/L   CO2 26 22 - 32 mmol/L   Glucose, Bld 123 (H) 70 - 99 mg/dL   BUN 47 (H) 6 - 20 mg/dL   Creatinine, Ser 2.00 (H) 0.61 - 1.24 mg/dL   Calcium 8.8 (L) 8.9 - 10.3 mg/dL   GFR calc non Af Amer 36 (L) >60 mL/min   GFR calc Af Amer 41 (L) >60 mL/min   Anion gap 11 5 - 15  CBC     Status: Abnormal   Collection Time: 05/04/19  4:22 AM  Result Value Ref Range   WBC 14.2 (H) 4.0 - 10.5 K/uL   RBC 5.13 4.22 - 5.81 MIL/uL   Hemoglobin 14.4 13.0 - 17.0 g/dL   HCT 44.9 39.0 - 52.0 %   MCV 87.5 80.0 - 100.0 fL  MCH 28.1 26.0 - 34.0 pg   MCHC 32.1 30.0 - 36.0 g/dL   RDW 14.8 11.5 - 15.5 %   Platelets 184 150 - 400 K/uL   nRBC 0.0 0.0 - 0.2 %   US Venous Img Lower Bilateral  Result Date: 05/03/2019 CLINICAL DATA:  58 year old male with bilateral lower extremity swelling and redness. EXAM: BILATERAL LOWER EXTREMITY VENOUS DOPPLER ULTRASOUND TECHNIQUE: Gray-scale sonography with graded compression, as well as color Doppler and duplex ultrasound were performed to evaluate the lower extremity deep venous systems from the level of the common femoral vein and including the common femoral, femoral, profunda femoral, popliteal and calf veins including the posterior tibial, peroneal and gastrocnemius veins when visible. The superficial great saphenous vein was also interrogated. Spectral Doppler was utilized to evaluate flow at rest and with distal augmentation maneuvers in the common femoral, femoral and popliteal veins. COMPARISON:  None. FINDINGS: Evaluation is limited due to patient's body habitus. RIGHT LOWER EXTREMITY Common Femoral Vein: No evidence of thrombus. Normal compressibility, respiratory phasicity and response to augmentation. Saphenofemoral Junction: No evidence of thrombus. Normal compressibility  and flow on color Doppler imaging. Profunda Femoral Vein: No evidence of thrombus. Normal compressibility and flow on color Doppler imaging. Femoral Vein: No evidence of thrombus. Normal compressibility, respiratory phasicity and response to augmentation. Popliteal Vein: No evidence of thrombus. Normal compressibility, respiratory phasicity and response to augmentation. Calf Veins: The calf vessels are not well visualized. Superficial Great Saphenous Vein: No evidence of thrombus. Normal compressibility. Venous Reflux:  None. Other Findings:  None. LEFT LOWER EXTREMITY Common Femoral Vein: No evidence of thrombus. Normal compressibility, respiratory phasicity and response to augmentation. Saphenofemoral Junction: No evidence of thrombus. Normal compressibility and flow on color Doppler imaging. Profunda Femoral Vein: No evidence of thrombus. Normal compressibility and flow on color Doppler imaging. Femoral Vein: No evidence of thrombus. Normal compressibility, respiratory phasicity and response to augmentation. Popliteal Vein: No evidence of thrombus. Normal compressibility, respiratory phasicity and response to augmentation. Calf Veins: The calf veins are poorly visualized. Superficial Great Saphenous Vein: No evidence of thrombus. Normal compressibility. Venous Reflux:  None. Other Findings:  None. IMPRESSION: No definite evidence of deep venous thrombosis in either lower extremity. Electronically Signed   By: Anner Crete M.D.   On: 05/03/2019 22:39   Dg Chest Portable 1 View  Result Date: 05/03/2019 CLINICAL DATA:  Acute shortness of breath EXAM: PORTABLE CHEST 1 VIEW COMPARISON:  None. FINDINGS: Cardiomegaly and mild pulmonary vascular congestion noted. There is no evidence of focal airspace disease, pulmonary edema, suspicious pulmonary nodule/mass, pleural effusion, or pneumothorax. No acute bony abnormalities are identified. IMPRESSION: Cardiomegaly with mild pulmonary vascular congestion.  Electronically Signed   By: Margarette Canada M.D.   On: 05/03/2019 18:33     ASSESSMENT AND PLAN: Congestive heart failure with underlying renal insufficiency and borderline troponin elevation secondary to renal insufficiency and congestive heart failure.  Echo is pending will review the echo and make further recommendation.  Nehemias Sauceda A

## 2019-05-05 DIAGNOSIS — R000 Tachycardia, unspecified: Secondary | ICD-10-CM

## 2019-05-05 LAB — URINE CULTURE: Culture: <10000 — AB

## 2019-05-05 LAB — BASIC METABOLIC PANEL
Anion gap: 7 (ref 5–15)
BUN: 37 mg/dL — ABNORMAL HIGH (ref 6–20)
CO2: 27 mmol/L (ref 22–32)
Calcium: 8.8 mg/dL — ABNORMAL LOW (ref 8.9–10.3)
Chloride: 103 mmol/L (ref 98–111)
Creatinine, Ser: 1.51 mg/dL — ABNORMAL HIGH (ref 0.61–1.24)
GFR calc Af Amer: 58 mL/min — ABNORMAL LOW (ref >60)
GFR calc non Af Amer: 50 mL/min — ABNORMAL LOW (ref >60)
Glucose, Bld: 133 mg/dL — ABNORMAL HIGH (ref 70–99)
Potassium: 4 mmol/L (ref 3.5–5.1)
Sodium: 137 mmol/L (ref 135–145)

## 2019-05-05 MED ORDER — CEFAZOLIN SODIUM-DEXTROSE 2-4 GM/100ML-% IV SOLN
2.0000 g | Freq: Three times a day (TID) | INTRAVENOUS | Status: DC
  Administered 2019-05-05 – 2019-05-06 (×4): 2 g via INTRAVENOUS
  Filled 2019-05-05 (×7): qty 100

## 2019-05-05 MED ORDER — FUROSEMIDE 10 MG/ML IJ SOLN
80.0000 mg | Freq: Two times a day (BID) | INTRAMUSCULAR | Status: DC
  Administered 2019-05-05: 80 mg via INTRAVENOUS
  Filled 2019-05-05: qty 8

## 2019-05-05 MED ORDER — SODIUM CHLORIDE 0.9 % IV SOLN: 2.0000 g | Freq: Three times a day (TID) | INTRAVENOUS | Status: DC

## 2019-05-05 NOTE — Progress Notes (Signed)
[place] at [place] NAME: Aaron Hale    MR#:  [id number]  DATE OF BIRTH:  [birth year]  SUBJECTIVE:   Patient report feeling much better after starting LASIX. LEE somewhat decreased Wants to go home REVIEW OF SYSTEMS:    Review of Systems  Constitutional: Negative for fever, chills weight loss HENT: Negative for ear pain, nosebleeds, congestion, facial swelling, rhinorrhea, neck pain, neck stiffness and ear discharge.   Respiratory: Negative for cough, ++shortness of breath, no wheezing  Cardiovascular: Negative for chest pain, palpitations and++ leg swelling.  Gastrointestinal: Negative for heartburn, abdominal pain, vomiting, diarrhea or consitpation Genitourinary: Negative for dysuria, urgency, frequency, hematuria Musculoskeletal: Negative for back pain or joint pain Neurological: Negative for dizziness, seizures, syncope, focal weakness,  numbness and headaches.  Hematological: Does not bruise/bleed easily.  Psychiatric/Behavioral: Negative for hallucinations, confusion, dysphoric mood SKIN weeping tawny   Tolerating Diet: yes      DRUG ALLERGIES:  No Known Allergies  VITALS:  Blood pressure 135/79, pulse 96, temperature 98.7 F (37.1 C), temperature source Oral, resp. rate 16, height 6' (1.829 m), weight (!) 186 kg, SpO2 94 %.  PHYSICAL EXAMINATION:  Constitutional: Appears morbidly obese no distress. HENT: Normocephalic. Marland Kitchen Oropharynx is clear and moist.  Eyes: Conjunctivae and EOM are normal. PERRLA, no scleral icterus.  Neck: Normal ROM. Neck supple. No JVD. No tracheal deviation. CVS: distant heart sounds RRR, S1/S2 +, no murmurs, no gallops, no carotid bruit.  Pulmonary: Effort and breath sounds normal, no stridor, rhonchi, wheezes, rales.  Abdominal: Soft. BS +,  no distension, tenderness, rebound or guarding.  Musculoskeletal: Normal range of motion.4+ LEE to mid thigh Weeping/.  Neuro: Alert. CN 2-12 grossly  intact. No focal deficits. Skin: Skin is warm and dry. Red tawny b/l LEE Psychiatric: Normal mood and affect.      LABORATORY PANEL:   CBC Recent Labs  Lab 05/04/19 0422  WBC 14.2*  HGB 14.4  HCT 44.9  PLT 184   ------------------------------------------------------------------------------------------------------------------  Chemistries  Recent Labs  Lab 05/03/19 1422  05/05/19 0354  NA 134*   < > 137  K 4.0   < > 4.0  CL 97*   < > 103  CO2 25   < > 27  GLUCOSE 123*   < > 133*  BUN 49*   < > 37*  CREATININE 2.21*   < > 1.51*  CALCIUM 8.7*   < > 8.8*  AST 25  --   --   ALT 29  --   --   ALKPHOS 42  --   --   BILITOT 1.0  --   --    < > = values in this interval not displayed.   ------------------------------------------------------------------------------------------------------------------  Cardiac Enzymes No results for input(s): TROPONINI in the last 168 hours. ------------------------------------------------------------------------------------------------------------------  RADIOLOGY:  US Venous Img Lower Bilateral  Result Date: 05/03/2019 CLINICAL DATA:  58 year old male with bilateral lower extremity swelling and redness. EXAM: BILATERAL LOWER EXTREMITY VENOUS DOPPLER ULTRASOUND TECHNIQUE: Gray-scale sonography with graded compression, as well as color Doppler and duplex ultrasound were performed to evaluate the lower extremity deep venous systems from the level of the common femoral vein and including the common femoral, femoral, profunda femoral, popliteal and calf veins including the posterior tibial, peroneal and gastrocnemius veins when visible. The superficial great saphenous vein was also interrogated. Spectral Doppler was utilized to evaluate flow at rest and with distal augmentation maneuvers in the common femoral, femoral and  popliteal veins. COMPARISON:  None. FINDINGS: Evaluation is limited due to patient's body habitus. RIGHT LOWER EXTREMITY Common  Femoral Vein: No evidence of thrombus. Normal compressibility, respiratory phasicity and response to augmentation. Saphenofemoral Junction: No evidence of thrombus. Normal compressibility and flow on color Doppler imaging. Profunda Femoral Vein: No evidence of thrombus. Normal compressibility and flow on color Doppler imaging. Femoral Vein: No evidence of thrombus. Normal compressibility, respiratory phasicity and response to augmentation. Popliteal Vein: No evidence of thrombus. Normal compressibility, respiratory phasicity and response to augmentation. Calf Veins: The calf vessels are not well visualized. Superficial Great Saphenous Vein: No evidence of thrombus. Normal compressibility. Venous Reflux:  None. Other Findings:  None. LEFT LOWER EXTREMITY Common Femoral Vein: No evidence of thrombus. Normal compressibility, respiratory phasicity and response to augmentation. Saphenofemoral Junction: No evidence of thrombus. Normal compressibility and flow on color Doppler imaging. Profunda Femoral Vein: No evidence of thrombus. Normal compressibility and flow on color Doppler imaging. Femoral Vein: No evidence of thrombus. Normal compressibility, respiratory phasicity and response to augmentation. Popliteal Vein: No evidence of thrombus. Normal compressibility, respiratory phasicity and response to augmentation. Calf Veins: The calf veins are poorly visualized. Superficial Great Saphenous Vein: No evidence of thrombus. Normal compressibility. Venous Reflux:  None. Other Findings:  None. IMPRESSION: No definite evidence of deep venous thrombosis in either lower extremity. Electronically Signed   By: Anner Crete M.D.   On: 05/03/2019 22:39   Dg Chest Portable 1 View  Result Date: 05/03/2019 CLINICAL DATA:  Acute shortness of breath EXAM: PORTABLE CHEST 1 VIEW COMPARISON:  None. FINDINGS: Cardiomegaly and mild pulmonary vascular congestion noted. There is no evidence of focal airspace disease, pulmonary edema,  suspicious pulmonary nodule/mass, pleural effusion, or pneumothorax. No acute bony abnormalities are identified. IMPRESSION: Cardiomegaly with mild pulmonary vascular congestion. Electronically Signed   By: Margarette Canada M.D.   On: 05/03/2019 18:33     ASSESSMENT AND PLAN:    58 year old male with morbid obesity and hypertension who  was recently treated for lower extremity cellulitis for 10 days presents to the emergency room due to increased lower extremity swelling.  1.  Acute hypoxic respiratory failure in the setting of acute diastolic heart failure with preserved ejection fraction by echocardiogram:  Patient seems to be responding to Lasix.  I will increase Lasix to 80 mg IV twice daily. Follow intake and output with daily weight Wean oxygen as tolerated Despite normal BNP clinical signs of right heart failure present on examination.  2.  Acute congestive heart failure likely diastolic: Continue Lasix Intake and output with daily weight Cardiology consultation appreciated  3.  Lower extremity cellulitis:  Continue Ancef for cellulitis Wound care for possibility of using Unna boots Elevate legs  4.  Morbid obesity: Patient encouraged to lose weight as tolerated. Patient needs outpatient sleep apnea evaluation. Discussed this with the patient.  Financially at this time he is unable to make appointment.   5 AKI in the setting of CHF exacerbation Creatinine has improved with IV Lasix.  6.  Essential hypertension: Norvasc stop due to lower extremity edema.  HCTZ/losartan stopped due to acute kidney injury Continue PRN hydralazine Monitor blood pressure   Management plans discussed with the patient and  he is in agreement.  CODE STATUS: full  TOTAL TIME TAKING CARE OF THIS PATIENT: 28 minutes.     POSSIBLE D/C 2-4 days, DEPENDING ON CLINICAL CONDITION.   Bettey Costa M.D on 05/05/2019 at 11:14 AM  Between 7am to 6pm - Pager -  [phone] After 6pm go to [email or web address] -  password [email or web address] [facility]  Office  [phone]  CC: Primary care physician; Patient, [name]  Note: This dictation was prepared with Dragon dictation along with smaller phrase technology. Any transcriptional errors that result from this process are unintentional.

## 2019-05-05 NOTE — Consult Note (Signed)
[place] Nurse wound consult note Reason for Consult: Cellulitis to bilateral lower legs.  Edematous and maroon with nonintact weeping ulcers present.  Wound type:infectious  Pressure Injury POA: NA Measurement: Left leg/anterior and lateral:  4 cm x 3.4 cm x 0.2 cm  Right lateral leg:  3 cm x 2 cm x 0.2 cm  Wound bed: Ruddy red with crusted weeping Drainage (amount, consistency, odor) moderate serosanguinous  No odor Periwound:edema, erythema and tender to touch Dressing procedure/placement/frequency: Cleanse bilateral legs with soap and water.  Zinc layer from below toes to below knee.  Alginate to area with nonintact wound for excess weeping.  Secure with self adherent coban from below toes to below knee.  Secure with tape. Change twice weekly while here.  Remove prior to discharge.  Will not follow at this time.  Please re-consult if needed.  Domenic Moras MSN, RN, FNP-BC CWON Wound, Ostomy, Continence Nurse Pager [phone]

## 2019-05-05 NOTE — Consult Note (Signed)
Pharmacy Antibiotic Note  Aaron Hale is a 58 y.o. male admitted on 05/03/2019 with cellulitis.  Pharmacy has been consulted for cefazolin dosing. Patient was previously on cefepime (received 5 doses). Patient finished 10-day course of cephalexin approximately 1 week prior to admission. Afebrile last 24 hr.   Plan: Cefazolin 2g IV q8h  Height: 6' (182.9 cm) Weight: (!) 410 lb 0.9 oz (186 kg) IBW/kg (Calculated) : 77.6  Temp (24hrs), Avg:98.5 F (36.9 C), Min:98 F (36.7 C), Max:98.8 F (37.1 C)  Recent Labs  Lab 05/03/19 1422 05/03/19 1644 05/03/19 1834 05/04/19 0422 05/05/19 0354  WBC 14.7*  --   --  14.2*  --   CREATININE 2.21*  --   --  2.00* 1.51*  LATICACIDVEN  --  1.1 1.1  --   --     Estimated Creatinine Clearance: 91.3 mL/min (A) (by C-G formula based on SCr of 1.51 mg/dL (H)).    No Known Allergies  Antimicrobials this admission: Cefepime 8/29 >> 8/31 Flagyl 8/29 x1 Vanc 8/29 x1  Dose adjustments this admission: N/A  Microbiology results: 8/29 BCx: NGTD 8/30 UCx: insignificant growth   Thank you for allowing pharmacy to be a part of this patient's care.  Benita Gutter 05/05/2019 8:17 AM

## 2019-05-05 NOTE — Progress Notes (Signed)
SUBJECTIVE: Patient denies any chest pain has some occasional shortness of breath   Vitals:   05/04/19 0752 05/04/19 1600 05/04/19 2253 05/05/19 0733  BP: 132/66 126/79 139/72 135/79  Pulse: 97 96 97 96  Resp: 18 20 18 16   Temp: 98.6 F (37 C) 98.8 F (37.1 C) 98 F (36.7 C) 98.7 F (37.1 C)  TempSrc: Oral Oral  Oral  SpO2: 93% 99% 94% 94%  Weight:      Height:        Intake/Output Summary (Last 24 hours) at 05/05/2019 1138 Last data filed at 05/04/2019 1459 Gross per 24 hour  Intake 220 ml  Output -  Net 220 ml    LABS: Basic Metabolic Panel: Recent Labs    05/04/19 0422 05/05/19 0354  NA 136 137  K 4.3 4.0  CL 99 103  CO2 26 27  GLUCOSE 123* 133*  BUN 47* 37*  CREATININE 2.00* 1.51*  CALCIUM 8.8* 8.8*   Liver Function Tests: Recent Labs    05/03/19 1422  AST 25  ALT 29  ALKPHOS 42  BILITOT 1.0  PROT 8.0  ALBUMIN 3.7   No results for input(s): LIPASE, AMYLASE in the last 72 hours. CBC: Recent Labs    05/03/19 1422 05/04/19 0422  WBC 14.7* 14.2*  NEUTROABS 12.0*  --   HGB 15.1 14.4  HCT 45.9 44.9  MCV 85.6 87.5  PLT 198 184   Cardiac Enzymes: No results for input(s): CKTOTAL, CKMB, CKMBINDEX, TROPONINI in the last 72 hours. BNP: Invalid input(s): POCBNP D-Dimer: No results for input(s): DDIMER in the last 72 hours. Hemoglobin A1C: No results for input(s): HGBA1C in the last 72 hours. Fasting Lipid Panel: No results for input(s): CHOL, HDL, LDLCALC, TRIG, CHOLHDL, LDLDIRECT in the last 72 hours. Thyroid Function Tests: No results for input(s): TSH, T4TOTAL, T3FREE, THYROIDAB in the last 72 hours.  Invalid input(s): FREET3 Anemia Panel: No results for input(s): VITAMINB12, FOLATE, FERRITIN, TIBC, IRON, RETICCTPCT in the last 72 hours.   PHYSICAL EXAM General: Well developed, well nourished, in no acute distress HEENT:  Normocephalic and atramatic Neck:  No JVD.  Lungs: Clear bilaterally to auscultation and percussion. Heart: HRRR .  Normal S1 and S2 without gallops or murmurs.  Abdomen: Bowel sounds are positive, abdomen soft and non-tender  Msk:  Back normal, normal gait. Normal strength and tone for age. Extremities: No clubbing, cyanosis or edema.   Neuro: Alert and oriented X 3. Psych:  Good affect, responds appropriately  TELEMETRY: Sinus rhythm  ASSESSMENT AND PLAN: Congestive heart failure with preserved LV systolic function with left ventricular ejection fraction 60%.  Agree with starting the Lasix as swelling of the legs is significant and continue that on top of the current antibiotics. Principal Problem:   Cellulitis    Dionisio David, MD, [facility] 05/05/2019 11:38 AM

## 2019-05-06 ENCOUNTER — Encounter: Payer: Self-pay | Admitting: Internal Medicine

## 2019-05-06 LAB — BASIC METABOLIC PANEL
Anion gap: 10 (ref 5–15)
BUN: 30 mg/dL — ABNORMAL HIGH (ref 6–20)
CO2: 31 mmol/L (ref 22–32)
Calcium: 9.4 mg/dL (ref 8.9–10.3)
Chloride: 99 mmol/L (ref 98–111)
Creatinine, Ser: 1.55 mg/dL — ABNORMAL HIGH (ref 0.61–1.24)
GFR calc Af Amer: 56 mL/min — ABNORMAL LOW (ref >60)
GFR calc non Af Amer: 49 mL/min — ABNORMAL LOW (ref >60)
Glucose, Bld: 124 mg/dL — ABNORMAL HIGH (ref 70–99)
Potassium: 5.2 mmol/L — ABNORMAL HIGH (ref 3.5–5.1)
Sodium: 140 mmol/L (ref 135–145)

## 2019-05-06 LAB — HIV ANTIBODY (ROUTINE TESTING W REFLEX): HIV Screen 4th Generation wRfx: NONREACTIVE

## 2019-05-06 MED ORDER — FUROSEMIDE 10 MG/ML IJ SOLN
40.0000 mg | Freq: Two times a day (BID) | INTRAMUSCULAR | Status: DC
  Administered 2019-05-06: 40 mg via INTRAVENOUS
  Filled 2019-05-06: qty 4

## 2019-05-06 MED ORDER — FUROSEMIDE 10 MG/ML IJ SOLN: 40.0000 mg | Freq: Once | INTRAMUSCULAR | Status: DC

## 2019-05-06 MED ORDER — FUROSEMIDE 10 MG/ML IJ SOLN
80.0000 mg | Freq: Two times a day (BID) | INTRAMUSCULAR | Status: DC
  Administered 2019-05-06: 80 mg via INTRAVENOUS
  Filled 2019-05-06: qty 8

## 2019-05-06 MED ORDER — FUROSEMIDE 40 MG PO TABS: 60.0000 mg | ORAL_TABLET | Freq: Two times a day (BID) | ORAL | 0 refills | Status: DC

## 2019-05-06 MED ORDER — CARVEDILOL 3.125 MG PO TABS
6.2500 mg | ORAL_TABLET | Freq: Two times a day (BID) | ORAL | Status: DC
  Administered 2019-05-06: 6.25 mg via ORAL
  Filled 2019-05-06: qty 2

## 2019-05-06 MED ORDER — CARVEDILOL 6.25 MG PO TABS: 3.1250 mg | ORAL_TABLET | Freq: Two times a day (BID) | ORAL | 11 refills | Status: DC

## 2019-05-06 MED ORDER — PATIROMER SORBITEX CALCIUM 8.4 G PO PACK
8.4000 g | PACK | Freq: Every day | ORAL | Status: DC
  Administered 2019-05-06: 11:00:00 8.4 g via ORAL
  Filled 2019-05-06 (×2): qty 1

## 2019-05-06 MED ORDER — CEPHALEXIN 500 MG PO CAPS: 500.0000 mg | ORAL_CAPSULE | Freq: Three times a day (TID) | ORAL | 0 refills | Status: AC

## 2019-05-06 NOTE — TOC Progression Note (Signed)
Transition of Care [facility]) - Progression Note    Patient Details  Name: Aaron Hale MRN: [id number] Date of Birth: 1961/06/15  Transition of Care [facility]) CM/SW [place], RN Phone Number: 05/06/2019, 3:51 PM  Clinical Narrative:    Took patient the Coreg, Lasix and Keflex medication into the room provided by Medication Management.  Handed the medication to his girlfriend at his request     Barriers to Discharge: Barriers Resolved  Expected Discharge Plan and Services           Expected Discharge Date: 05/06/19                                     Social Determinants of Health (SDOH) Interventions    Readmission Risk Interventions No flowsheet data found.

## 2019-05-06 NOTE — Progress Notes (Signed)
This nurse clarified IV lasix order with MD Mody. Per MD give IV lasix 40 mg X1 now.

## 2019-05-06 NOTE — Discharge Summary (Signed)
[place] at [place] NAME: Aaron Hale    MR#:  [id number]  DATE OF BIRTH:  [birth year]  DATE OF ADMISSION:  05/03/2019 ADMITTING PHYSICIAN: Vaughan Basta, MD  DATE OF DISCHARGE: 9/1/202  PRIMARY CARE PHYSICIAN: Patient, [name]    ADMISSION DIAGNOSIS:  Swelling [R60.9] AKI (acute kidney injury) ([place]) [N17.9] Cellulitis of lower extremity, unspecified laterality [L03.119] Sepsis, due to unspecified organism, unspecified whether acute organ dysfunction present ([place]) [A41.9]  DISCHARGE DIAGNOSIS:  Principal Problem:   Cellulitis   SECONDARY DIAGNOSIS:   Past Medical History:  Diagnosis Date  . CHF (congestive heart failure) ([place])   . Hypertension   . Morbid obesity [facility])     HOSPITAL COURSE:   58 year old male with morbid obesity and hypertension who  was recently treated for lower extremity cellulitis for 10 days presents to the emergency room due to increased lower extremity swelling.  1.  Acute hypoxic respiratory failure in the setting of acute diastolic heart failure with preserved ejection fraction by echocardiogram:  Patient seems to be responding to Lasix.  He will be discharged on oral Lasix.  He is referred to CHF clinic upon discharge.  He will follow-up with cardiology as outpatient as well.   He will require oxygen upon discharge.    2.  Acute diastolic on chronic congestive heart failure  Continue Lasix Intake and output with daily weight Management as stated above   3.  Lower extremity cellulitis:  He will be discharged on oral Keflex for his lower extremity cellulitis.  Wound care nurse has seen the patient.  He has strict instructions for wound care for his lower extremity edema.     4.  Morbid obesity: Patient encouraged to lose weight as tolerated. Patient needs outpatient sleep apnea evaluation. Discussed this with the patient.  Financially at this time he is unable to make  appointment.   5 AKI in the setting of CHF exacerbation Creatinine has improved with IV Lasix.  6.  Essential hypertension: Norvasc has been stopped due to lower extremity edema.    He will be discharged on oral Coreg and losartan.   DISCHARGE CONDITIONS AND DIET:   Stable for discharge heart healthy diet  CONSULTS OBTAINED:  Treatment Team:  Dionisio David, MD  DRUG ALLERGIES:  No Known Allergies  DISCHARGE MEDICATIONS:   Allergies as of 05/06/2019   No Known Allergies     Medication List    STOP taking these medications   amLODipine 10 MG tablet Commonly known as: NORVASC   hydrochlorothiazide 25 MG tablet Commonly known as: HYDRODIURIL     TAKE these medications   carvedilol 6.25 MG tablet Commonly known as: Coreg Take 0.5 tablets (3.125 mg total) by mouth 2 (two) times daily with a meal.   cephALEXin 500 MG capsule Commonly known as: KEFLEX Take 1 capsule (500 mg total) by mouth 3 (three) times daily for 10 days.   clotrimazole-betamethasone cream Commonly known as: LOTRISONE Apply 1 application topically 2 (two) times daily.   furosemide 40 MG tablet Commonly known as: Lasix Take 1.5 tablets (60 mg total) by mouth 2 (two) times daily.   losartan 50 MG tablet Commonly known as: COZAAR Take 50 mg by mouth daily.            Durable Medical Equipment  (From admission, onward)         Start     Ordered   05/06/19 1056  For home use  only DME oxygen  Once    Question Answer Comment  Length of Need Lifetime   Mode or (Route) Nasal cannula   Liters per Minute 2   Frequency Continuous (stationary and portable oxygen unit needed)   Oxygen conserving device Yes   Oxygen delivery system Gas      05/06/19 1055   05/06/19 0854  DME Oxygen  Once    Question Answer Comment  Length of Need Lifetime   Mode or (Route) Nasal cannula   Liters per Minute 2   Frequency Continuous (stationary and portable oxygen unit needed)   Oxygen conserving device  Yes   Oxygen delivery system Gas      05/06/19 0854           Discharge Care Instructions  (From admission, onward)         Start     Ordered   05/06/19 0000  Discharge wound care:    Comments: Cleanse bilateral legs with soap and water.  Zinc layer from below toes to below knee.  Alginate to area with nonintact wound for excess weeping   05/06/19 0854            Today   CHIEF COMPLAINT:  Patient doing well.  Reports that lower extremity edema has improved.  He would like to go home.   VITAL SIGNS:  Blood pressure 137/76, pulse 97, temperature 98.7 F (37.1 C), temperature source Oral, resp. rate 19, height 6' (1.829 m), weight (!) 187.4 kg, SpO2 (!) 86 %.   REVIEW OF SYSTEMS:  Review of Systems  Constitutional: Negative.  Negative for chills, fever and malaise/fatigue.  HENT: Negative.  Negative for ear discharge, ear pain, hearing loss, nosebleeds and sore throat.   Eyes: Negative.  Negative for blurred vision and pain.  Respiratory: Negative.  Negative for cough, hemoptysis, shortness of breath and wheezing.   Cardiovascular: Positive for leg swelling (better). Negative for chest pain and palpitations.  Gastrointestinal: Negative.  Negative for abdominal pain, blood in stool, diarrhea, nausea and vomiting.  Genitourinary: Negative.  Negative for dysuria.  Musculoskeletal: Negative.  Negative for back pain.  Skin: Negative.   Neurological: Negative for dizziness, tremors, speech change, focal weakness, seizures and headaches.  Endo/Heme/Allergies: Negative.  Does not bruise/bleed easily.  Psychiatric/Behavioral: Negative.  Negative for depression, hallucinations and suicidal ideas.     PHYSICAL EXAMINATION:  GENERAL:  58 y.o.-year-old patient lying in the bed with no acute distress.  Morbidly obese NECK:  Supple, no jugular venous distention. No thyroid enlargement, no tenderness.  LUNGS: Normal breath sounds bilaterally, no wheezing, rales,rhonchi  No use  of accessory muscles of respiration.  CARDIOVASCULAR: S1, S2 normal. No murmurs, rubs, or gallops.  ABDOMEN: Soft, non-tender, non-distended. Bowel sounds present. No organomegaly or mass.  EXTREMITIES:2+ LEE  No cyanosis, or clubbing.  PSYCHIATRIC: The patient is alert and oriented x 3.  SKIN: No obvious rash, lesion, or ulcer.  LEE  Ruddy red with crusted weeping DATA REVIEW:   CBC Recent Labs  Lab 05/04/19 0422  WBC 14.2*  HGB 14.4  HCT 44.9  PLT 184    Chemistries  Recent Labs  Lab 05/03/19 1422  05/06/19 0530  NA 134*   < > 140  K 4.0   < > 5.2*  CL 97*   < > 99  CO2 25   < > 31  GLUCOSE 123*   < > 124*  BUN 49*   < > 30*  CREATININE 2.21*   < >  1.55*  CALCIUM 8.7*   < > 9.4  AST 25  --   --   ALT 29  --   --   ALKPHOS 42  --   --   BILITOT 1.0  --   --    < > = values in this interval not displayed.    Cardiac Enzymes No results for input(s): TROPONINI in the last 168 hours.  Microbiology Results  @MICRORSLT48 @  RADIOLOGY:  No results found.    Allergies as of 05/06/2019   No Known Allergies     Medication List    STOP taking these medications   amLODipine 10 MG tablet Commonly known as: NORVASC   hydrochlorothiazide 25 MG tablet Commonly known as: HYDRODIURIL     TAKE these medications   carvedilol 6.25 MG tablet Commonly known as: Coreg Take 0.5 tablets (3.125 mg total) by mouth 2 (two) times daily with a meal.   cephALEXin 500 MG capsule Commonly known as: KEFLEX Take 1 capsule (500 mg total) by mouth 3 (three) times daily for 10 days.   clotrimazole-betamethasone cream Commonly known as: LOTRISONE Apply 1 application topically 2 (two) times daily.   furosemide 40 MG tablet Commonly known as: Lasix Take 1.5 tablets (60 mg total) by mouth 2 (two) times daily.   losartan 50 MG tablet Commonly known as: COZAAR Take 50 mg by mouth daily.            Durable Medical Equipment  (From admission, onward)         Start      Ordered   05/06/19 1056  For home use only DME oxygen  Once    Question Answer Comment  Length of Need Lifetime   Mode or (Route) Nasal cannula   Liters per Minute 2   Frequency Continuous (stationary and portable oxygen unit needed)   Oxygen conserving device Yes   Oxygen delivery system Gas      05/06/19 1055   05/06/19 0854  DME Oxygen  Once    Question Answer Comment  Length of Need Lifetime   Mode or (Route) Nasal cannula   Liters per Minute 2   Frequency Continuous (stationary and portable oxygen unit needed)   Oxygen conserving device Yes   Oxygen delivery system Gas      05/06/19 0854           Discharge Care Instructions  (From admission, onward)         Start     Ordered   05/06/19 0000  Discharge wound care:    Comments: Cleanse bilateral legs with soap and water.  Zinc layer from below toes to below knee.  Alginate to area with nonintact wound for excess weeping   05/06/19 0854             Management plans discussed with the patient and he is in agreement. Stable for discharge home   Patient should follow up with cardiology  CODE STATUS:     Code Status Orders  (From admission, onward)         Start     Ordered   05/03/19 2012  Full code  Continuous     05/03/19 2011        Code Status History    This patient has a current code status but no historical code status.   Advance Care Planning Activity      TOTAL TIME TAKING CARE OF THIS PATIENT: 38 minutes.    Note: This  dictation was prepared with Dragon dictation along with smaller phrase technology. Any transcriptional errors that result from this process are unintentional.  Bettey Costa M.D on 05/06/2019 at 11:02 AM  Between 7am to 6pm - Pager - [phone] After 6pm go to [email or web address] - password [email or web address] [place] [facility]  Office  [phone]  CC: Primary care physician; Patient, [name]

## 2019-05-06 NOTE — Progress Notes (Signed)
SATURATION QUALIFICATIONS:   Patient Saturations on Room Air at Rest = 88%  Patient Saturations on Room Air while Ambulating = 86%  Patient Saturations on 2 Liters of oxygen while Ambulating = 96%  Please briefly explain why patient needs home oxygen:  Low oxygen at rest and ambulating

## 2019-05-06 NOTE — TOC Progression Note (Signed)
Transition of Care [facility]) - Progression Note    Patient Details  Name: Aaron Hale MRN: [id number] Date of Birth: [birth year]  Transition of Care [facility]) CM/SW [place], RN Phone Number: 05/06/2019, 11:16 AM  Clinical Narrative:      This patient does not have a quaifying respiratory diagnosis for Home O2   Barriers to Discharge: Barriers Resolved  Expected Discharge Plan and Services           Expected Discharge Date: 05/06/19                                     Social Determinants of Health (SDOH) Interventions    Readmission Risk Interventions No flowsheet data found.

## 2019-05-06 NOTE — Progress Notes (Signed)
SUBJECTIVE: Patient denies any chest pain or shortness of breath   Vitals:   05/05/19 1514 05/05/19 2023 05/06/19 0615 05/06/19 0722  BP: 129/61 139/82  137/76  Pulse: 90 (!) 105  97  Resp: 15   19  Temp: 99.1 F (37.3 C) 98.8 F (37.1 C)  98.7 F (37.1 C)  TempSrc: Oral Oral  Oral  SpO2: 93% 92%  96%  Weight:   (!) 187.4 kg   Height:        Intake/Output Summary (Last 24 hours) at 05/06/2019 1031 Last data filed at 05/06/2019 1021 Gross per 24 hour  Intake 255.05 ml  Output 50 ml  Net 205.05 ml    LABS: Basic Metabolic Panel: Recent Labs    05/05/19 0354 05/06/19 0530  NA 137 140  K 4.0 5.2*  CL 103 99  CO2 27 31  GLUCOSE 133* 124*  BUN 37* 30*  CREATININE 1.51* 1.55*  CALCIUM 8.8* 9.4   Liver Function Tests: Recent Labs    05/03/19 1422  AST 25  ALT 29  ALKPHOS 42  BILITOT 1.0  PROT 8.0  ALBUMIN 3.7   No results for input(s): LIPASE, AMYLASE in the last 72 hours. CBC: Recent Labs    05/03/19 1422 05/04/19 0422  WBC 14.7* 14.2*  NEUTROABS 12.0*  --   HGB 15.1 14.4  HCT 45.9 44.9  MCV 85.6 87.5  PLT 198 184   Cardiac Enzymes: No results for input(s): CKTOTAL, CKMB, CKMBINDEX, TROPONINI in the last 72 hours. BNP: Invalid input(s): POCBNP D-Dimer: No results for input(s): DDIMER in the last 72 hours. Hemoglobin A1C: No results for input(s): HGBA1C in the last 72 hours. Fasting Lipid Panel: No results for input(s): CHOL, HDL, LDLCALC, TRIG, CHOLHDL, LDLDIRECT in the last 72 hours. Thyroid Function Tests: No results for input(s): TSH, T4TOTAL, T3FREE, THYROIDAB in the last 72 hours.  Invalid input(s): FREET3 Anemia Panel: No results for input(s): VITAMINB12, FOLATE, FERRITIN, TIBC, IRON, RETICCTPCT in the last 72 hours.   PHYSICAL EXAM General: Well developed, well nourished, in no acute distress HEENT:  Normocephalic and atramatic Neck:  No JVD.  Lungs: Clear bilaterally to auscultation and percussion. Heart: HRRR . Normal S1 and S2  without gallops or murmurs.  Abdomen: Bowel sounds are positive, abdomen soft and non-tender  Msk:  Back normal, normal gait. Normal strength and tone for age. Extremities: No clubbing, cyanosis or edema.   Neuro: Alert and oriented X 3. Psych:  Good affect, responds appropriately  TELEMETRY: Sinus rhythm  ASSESSMENT AND PLAN: Congestive heart failure with possible DVT and probable phlebitis and cellulitis.  Patient can be discharged cardiac point of view with follow-up in the office on 9/1 at 10 AM  Principal Problem:   Cellulitis    Anton Cheramie A, MD, [facility] 05/06/2019 10:31 AM

## 2019-05-06 NOTE — Progress Notes (Signed)
Notified by tele that Pts HR is 155, pt in bathroom MD Mody notified.

## 2019-05-06 NOTE — Progress Notes (Signed)
[facility] NAME: Aaron Hale    MR#:  [id number]  DATE OF BIRTH:  1961-08-06  SUBJECTIVE:   Wants to go home  REVIEW OF SYSTEMS:    Review of Systems  Constitutional: Negative for fever, chills weight loss HENT: Negative for ear pain, nosebleeds, congestion, facial swelling, rhinorrhea, neck pain, neck stiffness and ear discharge.   Respiratory: Negative for cough, ++shortness of breath, no wheezing  Cardiovascular: Negative for chest pain, palpitations and++ leg swelling.  Gastrointestinal: Negative for heartburn, abdominal pain, vomiting, diarrhea or consitpation Genitourinary: Negative for dysuria, urgency, frequency, hematuria Musculoskeletal: Negative for back pain or joint pain Neurological: Negative for dizziness, seizures, syncope, focal weakness,  numbness and headaches.  Hematological: Does not bruise/bleed easily.  Psychiatric/Behavioral: Negative for hallucinations, confusion, dysphoric mood SKIN weeping tawny   Tolerating Diet: yes      DRUG ALLERGIES:  No Known Allergies  VITALS:  Blood pressure 137/76, pulse 97, temperature 98.7 F (37.1 C), temperature source Oral, resp. rate 19, height 6' (1.829 m), weight (!) 187.4 kg, SpO2 96 %.  PHYSICAL EXAMINATION:  Constitutional: Appears morbidly obese no distress. HENT: Normocephalic. Marland Kitchen Oropharynx is clear and moist.  Eyes: Conjunctivae and EOM are normal. PERRLA, no scleral icterus.  Neck: Normal ROM. Neck supple. No JVD. No tracheal deviation. CVS: distant heart sounds RRR, S1/S2 +, no murmurs, no gallops, no carotid bruit.  Pulmonary: Effort and breath sounds normal, no stridor, rhonchi, wheezes, rales.  Abdominal: Soft. BS +,  no distension, tenderness, rebound or guarding.  Musculoskeletal: Normal range of motion.3+ LEE Neuro: Alert. CN 2-12 grossly intact. No focal deficits. Skin: Skin is warm and dry. Red tawny b/l LEE Psychiatric: Normal mood and  affect.      LABORATORY PANEL:   CBC Recent Labs  Lab 05/04/19 0422  WBC 14.2*  HGB 14.4  HCT 44.9  PLT 184   ------------------------------------------------------------------------------------------------------------------  Chemistries  Recent Labs  Lab 05/03/19 1422  05/06/19 0530  NA 134*   < > 140  K 4.0   < > 5.2*  CL 97*   < > 99  CO2 25   < > 31  GLUCOSE 123*   < > 124*  BUN 49*   < > 30*  CREATININE 2.21*   < > 1.55*  CALCIUM 8.7*   < > 9.4  AST 25  --   --   ALT 29  --   --   ALKPHOS 42  --   --   BILITOT 1.0  --   --    < > = values in this interval not displayed.   ------------------------------------------------------------------------------------------------------------------  Cardiac Enzymes No results for input(s): TROPONINI in the last 168 hours. ------------------------------------------------------------------------------------------------------------------  RADIOLOGY:  No results found.   ASSESSMENT AND PLAN:    58 year old male with morbid obesity and hypertension who was recently treated for lower extremity cellulitis for 10 days presents to the emergency room due to increased lower extremity swelling.  1. Acute hypoxic respiratory failure in the settingof acute diastolic heart failure with preserved ejection fraction by echocardiogram:  Patient seems to be responding to Lasix.  He will be discharged on oral Lasix.  He is referred to CHF clinic upon discharge.  He will follow-up with cardiology as outpatient as well.   He will require oxygen upon discharge.    2. Acute diastolic on chronic congestive heart failure  Continue Lasix Intake and output with daily weight Management as stated  above   3. Lower extremity cellulitis:  He will be discharged on oral Keflex for his lower extremity cellulitis.  Wound care nurse has seen the patient.  He has strict instructions for wound care for his lower extremity edema.     4.  Morbid obesity: Patient encouraged to lose weight as tolerated. Patient needs outpatient sleep apnea evaluation. Discussed this with the patient. Financially at this time he is unable to make appointment.   5 AKI in the setting of CHF exacerbation Creatinine has improved with IV Lasix.  6. Essential hypertension: Norvasc has been stopped due to lower extremity edema.   He will be discharged on oral Coreg and losartan.   Management plans discussed with the patient and  he is in agreement.  CODE STATUS: full  TOTAL TIME TAKING CARE OF THIS PATIENT: 25 minutes.     POSSIBLE D/Ctoday, DEPENDING ON CLINICAL CONDITION.   Bettey Costa M.D on 05/06/2019 at 11:30 AM  Between 7am to 6pm - Pager - [phone] After 6pm go to [email or web address] - password [email or web address] [facility]  Office  [phone]  CC: Primary care physician; Patient, [name]  Note: This dictation was prepared with Dragon dictation along with smaller phrase technology. Any transcriptional errors that result from this process are unintentional.

## 2019-05-06 NOTE — TOC Transition Note (Signed)
Transition of Care [facility] - CM/SW Discharge Note   Patient Details  Name: Aaron Hale MRN: [id number] Date of Birth: Jul 02, 1961  Transition of Care [facility] CM/SW Contact:  Su Hilt, RN Phone Number: 05/06/2019, 1:36 PM   Clinical Narrative:     Spoke with the patient to discuss [place] needs He needs to have [facility] and nursing, Set up with [facility] services for PT and nursing Brad with [employer] is aware that the Sister will pay for home oxygen at $260 per month, I spoke with the sister and explained how to apply for medicaid, I provided [employer] with her name and phone number for payment for the oxygen The patient also needs a [employer] BSC and RW and Brad with [employer] is aware Medication was sent to [facility] for assistance and they are filling prescriptions and they called and said they are ready. He has a [facility] in [place] [facility]  Final next level of care: Home/Self Care Barriers to Discharge: Barriers Resolved   Patient Goals and CMS Choice        Discharge Placement                       Discharge Plan and Services                                     Social Determinants of Health (SDOH) Interventions     Readmission Risk Interventions No flowsheet data found.

## 2019-05-06 NOTE — TOC Transition Note (Signed)
Transition of Care [facility]) - CM/SW Discharge Note   Patient Details  Name: Aaron Hale MRN: [id number] Date of Birth: 29-Mar-1961  Transition of Care [facility]) CM/SW Contact:  Su Hilt, RN Phone Number: 05/06/2019, 9:24 AM   Clinical Narrative:    Provided the patient with the [facility] application to assist with medications, He does have a PCP in place, he has no insurance I faxed the prescriptions to med [facility] and emailed referral to [facility]. I explained to the patient that if he would like to continue to get his medications thru [facility] he would need to fill out the application and provide the needed forms, I explained that they would help him with the forms, he stated understanding  Final next level of care: Home/Self Care Barriers to Discharge: Barriers Resolved   Patient Goals and CMS Choice        Discharge Placement                       Discharge Plan and Services                                     Social Determinants of Health (SDOH) Interventions     Readmission Risk Interventions No flowsheet data found.

## 2019-05-06 NOTE — Progress Notes (Signed)
DISCHARGE NOTE:  Pt and girlfriend given discharge instructions and scripts. They verbalized understanding.Walker sent with pt. Pt connected to home O2 @ 2L and  wheeled to car by staff.

## 2019-05-06 NOTE — Progress Notes (Signed)
Pts O2 86% on room air walking to the bathroom. Pt placed back on 2 L O2, MD Mody notified.

## 2019-05-06 NOTE — TOC Progression Note (Signed)
Transition of Care [facility]) - Progression Note    Patient Details  Name: Aaron Hale MRN: [id number] Date of Birth: 1960/10/16  Transition of Care [facility]) CM/SW [place], RN Phone Number: 05/06/2019, 10:51 AM  Clinical Narrative:         Barriers to Discharge: Barriers Resolved  Expected Discharge Plan and Services  Patient desaturated on RA, needs Home O2, Notified Brad with [employer]          Expected Discharge Date: 05/06/19                                     Social Determinants of Health (SDOH) Interventions    Readmission Risk Interventions No flowsheet data found.

## 2019-05-08 LAB — CULTURE, BLOOD (ROUTINE X 2)
Culture: NO GROWTH
Culture: NO GROWTH
Special Requests: ADEQUATE

## 2019-05-15 NOTE — Progress Notes (Signed)
Patient ID: Aaron Hale, male    DOB: 08-27-1961, 58 y.o.   MRN: [id number]  HPI  Aaron Hale is a 58 y/o male with a history of HTN and chronic heart failure.  Echo report from 05/04/2019 reviewed and showed an EF of 60-65%.  Admitted 05/03/2019 due to acute heart failure. Cardiology and wound consults obtained. Initially given IV lasix and then transitioned to oral diuretics. Given antibiotics for cellulitis. Discharged after 3 days.   He presents today for his initial visit with a chief complaint of moderate fatigue upon minimal exertion. He describes this as having been present for several months. He has associated shortness of breath, cough, pedal edema, light-headedness, anxiety and leg pain along with this. He denies any difficulty sleeping, abdominal distention, palpitations, chest pain or weight gain. Currently receiving PT and nursing services in the home. Wearing oxygen at 2L around the clock.   Past Medical History:  Diagnosis Date  . CHF (congestive heart failure) ([place])   . Hypertension   . Morbid obesity ([place])    History reviewed. No pertinent surgical history. Family History  Problem Relation Age of Onset  . CAD Mother    Social History   Tobacco Use  . Smoking status: Never Smoker  . Smokeless tobacco: Never Used  Substance Use Topics  . Alcohol use: Yes   No Known Allergies Prior to Admission medications   Medication Sig Start Date End Date Taking? Authorizing Provider  carvedilol (COREG) 6.25 MG tablet Take 6.25 mg by mouth 2 (two) times daily with a meal.   Yes [provider]  cephALEXin (KEFLEX) 500 MG capsule Take 1 capsule (500 mg total) by mouth 3 (three) times daily for 10 days. 05/06/19 05/16/19 Yes Mody, Ulice Bold, MD  furosemide (LASIX) 40 MG tablet Take 1.5 tablets (60 mg total) by mouth 2 (two) times daily. 05/06/19  Yes Mody, Ulice Bold, MD  losartan (COZAAR) 50 MG tablet Take 50 mg by mouth daily. 04/18/19  Yes [provider]    Review of  Systems  Constitutional: Positive for fatigue (easily). Negative for appetite change.  HENT: Negative for congestion, postnasal drip and sore throat.   Eyes: Negative.   Respiratory: Positive for cough (productive) and shortness of breath (with moderate exertion). Negative for chest tightness.        + snoring  Cardiovascular: Positive for leg swelling (wrapped in UNNA boots). Negative for chest pain and palpitations.  Gastrointestinal: Negative for abdominal distention and abdominal pain.  Endocrine: Negative.   Genitourinary: Negative.   Musculoskeletal: Positive for arthralgias (legs hurt). Negative for back pain.  Skin: Negative.   Allergic/Immunologic: Negative.   Neurological: Positive for light-headedness (at times). Negative for dizziness.  Hematological: Negative for adenopathy. Does not bruise/bleed easily.  Psychiatric/Behavioral: Negative for dysphoric mood and sleep disturbance. The patient is nervous/anxious (at times).    Vitals:   05/16/19 1224  BP: (!) 147/87  Pulse: 89  Resp: 18  SpO2: 93%  Weight: (!) 381 lb 8 oz (173 kg)  Height: 6' (1.829 m)   Wt Readings from Last 3 Encounters:  05/16/19 (!) 381 lb 8 oz (173 kg)  05/06/19 (!) 413 lb 2.3 oz (187.4 kg)  09/01/17 (!) 360 lb (163.3 kg)   Lab Results  Component Value Date   CREATININE 1.55 (H) 05/06/2019   CREATININE 1.51 (H) 05/05/2019   CREATININE 2.00 (H) 05/04/2019    Physical Exam Vitals signs and nursing note reviewed.  Constitutional:      Appearance:  He is well-developed.  HENT:     Head: Normocephalic and atraumatic.  Neck:     Musculoskeletal: Normal range of motion and neck supple.     Vascular: No JVD.  Cardiovascular:     Rate and Rhythm: Normal rate and regular rhythm.  Pulmonary:     Effort: Pulmonary effort is normal. No respiratory distress.     Breath sounds: No wheezing or rales.  Abdominal:     Palpations: Abdomen is soft.     Tenderness: There is no abdominal tenderness.   Musculoskeletal:     Right lower leg: Edema (wrapped in UNNA boots) present.     Left lower leg: Edema (wrapped in UNNA boots) present.  Skin:    General: Skin is warm and dry.  Neurological:     General: No focal deficit present.     Mental Status: He is alert and oriented to person, place, and time.  Psychiatric:        Mood and Affect: Mood normal. Mood is not anxious.        Behavior: Behavior normal.     Assessment & Plan:  1: Chronic heart failure with preserved ejection fraction- - NYHA class III - euvolemic today - weighing daily and he was instructed to call for an overnight weight gain of >2 pounds or a weekly weight gain of >5 pounds - not adding salt and girlfriend that is present has been reading food labels for sodium content; reinforced the importance of keeping daily sodium intake to 2000mg  / day and written dietary information was given to him about this.  - saw cardiology Aaron Hale) 05/15/2019 and had lab work drawn - scheduled for a stress test on 05/22/2019 - wearing oxygen at 2L around the clock - has PT and nursing services coming in the home - may need sleep study to rule out sleep apnea as patient's girlfriend does says that patient snores - BNP 05/03/2019 was 26.0  2: HTN- - BP looks good today - sees PCP with [facility] in [place] - BMP 05/06/2019 reviewed and showed sodium 140, potassium 5.2, creatinine 1.55 and GFR 49  3: Lymphedema- - stage 2 - legs are currently wrapped with UNNA boots so difficult to assess the degree of edema - does elevate his legs when sitting for long periods of time - limited in his ability to exercise due to his fatigue - consider lymphapress compression boots if edema continues once he is done with the UNNA wraps  Medication list reviewed.  Return in 1 month or sooner for any questions/problems before then.

## 2019-05-16 ENCOUNTER — Ambulatory Visit: Payer: Self-pay | Attending: Family | Admitting: Family

## 2019-05-16 ENCOUNTER — Other Ambulatory Visit: Payer: Self-pay

## 2019-05-16 ENCOUNTER — Encounter: Payer: Self-pay | Admitting: Family

## 2019-05-16 VITALS — BP 147/87 | HR 89 | Resp 18 | Ht 72.0 in | Wt 381.5 lb

## 2019-05-16 DIAGNOSIS — Z79899 Other long term (current) drug therapy: Secondary | ICD-10-CM | POA: Insufficient documentation

## 2019-05-16 DIAGNOSIS — I110 Hypertensive heart disease with heart failure: Secondary | ICD-10-CM | POA: Insufficient documentation

## 2019-05-16 DIAGNOSIS — I10 Essential (primary) hypertension: Secondary | ICD-10-CM

## 2019-05-16 DIAGNOSIS — I890 Lymphedema, not elsewhere classified: Secondary | ICD-10-CM | POA: Insufficient documentation

## 2019-05-16 DIAGNOSIS — Z8249 Family history of ischemic heart disease and other diseases of the circulatory system: Secondary | ICD-10-CM | POA: Insufficient documentation

## 2019-05-16 DIAGNOSIS — I5032 Chronic diastolic (congestive) heart failure: Secondary | ICD-10-CM | POA: Insufficient documentation

## 2019-05-16 NOTE — Patient Instructions (Signed)
Continue weighing daily and call for an overnight weight gain of > 2 pounds or a weekly weight gain of >5 pounds. 

## 2019-06-18 ENCOUNTER — Encounter: Payer: Self-pay | Admitting: Family

## 2019-06-18 ENCOUNTER — Ambulatory Visit: Payer: Self-pay | Attending: Family | Admitting: Family

## 2019-06-18 ENCOUNTER — Other Ambulatory Visit: Payer: Self-pay

## 2019-06-18 VITALS — BP 163/84 | HR 78 | Resp 16 | Ht 72.0 in | Wt 382.4 lb

## 2019-06-18 DIAGNOSIS — I890 Lymphedema, not elsewhere classified: Secondary | ICD-10-CM | POA: Insufficient documentation

## 2019-06-18 DIAGNOSIS — Z79899 Other long term (current) drug therapy: Secondary | ICD-10-CM | POA: Insufficient documentation

## 2019-06-18 DIAGNOSIS — I509 Heart failure, unspecified: Secondary | ICD-10-CM | POA: Insufficient documentation

## 2019-06-18 DIAGNOSIS — Z7982 Long term (current) use of aspirin: Secondary | ICD-10-CM | POA: Insufficient documentation

## 2019-06-18 DIAGNOSIS — R42 Dizziness and giddiness: Secondary | ICD-10-CM | POA: Insufficient documentation

## 2019-06-18 DIAGNOSIS — I110 Hypertensive heart disease with heart failure: Secondary | ICD-10-CM | POA: Insufficient documentation

## 2019-06-18 DIAGNOSIS — R0602 Shortness of breath: Secondary | ICD-10-CM | POA: Insufficient documentation

## 2019-06-18 DIAGNOSIS — Z8249 Family history of ischemic heart disease and other diseases of the circulatory system: Secondary | ICD-10-CM | POA: Insufficient documentation

## 2019-06-18 DIAGNOSIS — M79661 Pain in right lower leg: Secondary | ICD-10-CM | POA: Insufficient documentation

## 2019-06-18 DIAGNOSIS — M7989 Other specified soft tissue disorders: Secondary | ICD-10-CM | POA: Insufficient documentation

## 2019-06-18 DIAGNOSIS — M79662 Pain in left lower leg: Secondary | ICD-10-CM | POA: Insufficient documentation

## 2019-06-18 DIAGNOSIS — I10 Essential (primary) hypertension: Secondary | ICD-10-CM

## 2019-06-18 DIAGNOSIS — I5032 Chronic diastolic (congestive) heart failure: Secondary | ICD-10-CM

## 2019-06-18 NOTE — Progress Notes (Signed)
Patient ID: Aaron Hale, male    DOB: October 15, 1960, 58 y.o.   MRN: [id number]  HPI  Aaron Hale is a 58 y/o male with a history of HTN and chronic heart failure.  Echo report from 05/04/2019 reviewed and showed an EF of 60-65%.  Admitted 05/03/2019 due to acute heart failure. Cardiology and wound consults obtained. Initially given IV lasix and then transitioned to oral diuretics. Given antibiotics for cellulitis. Discharged after 3 days.   He presents today for his follow-up visit with a chief complaint of minimal shortness of breath with moderate exertion and leg swelling. He is running low on supplies for the unna boots and is asking if he still needs to keep his legs wrapped. He is trying to keep them elevated when he sits down. This is associated with light-headedness if he changes positions too quickly and he sleeps in a recliner. He denies fatigue, chest pain, palpitations, abdominal distention, and dizziness. He wears 2L oxygen around the clock. He has been weighing himself every day and his weight stable between 379-381 pounds. He has been reading the nutrition label for sodium content. He has been trying to exercise in the house by doing exercises PT had showed him in the past. He does not have insurance and is in the process of getting medicaid and is requesting written documentation of his health condition. He does not have a PCP at this point in time. He states that since cardiology increased his losartan, he has not been "feeling right" at times.   Past Medical History:  Diagnosis Date  . CHF (congestive heart failure) ([place])   . Hypertension   . Morbid obesity ([place])    No past surgical history on file. Family History  Problem Relation Age of Onset  . CAD Mother    Social History   Tobacco Use  . Smoking status: Never Smoker  . Smokeless tobacco: Never Used  Substance Use Topics  . Alcohol use: Yes   No Known Allergies  Prior to Admission medications   Medication Sig Start  Date End Date Taking? Authorizing Provider  aspirin 81 MG chewable tablet Chew by mouth daily.   Yes [provider]  carvedilol (COREG) 6.25 MG tablet Take 12.5 mg by mouth 2 (two) times daily with a meal.    Yes [provider]  losartan (COZAAR) 50 MG tablet Take 100 mg by mouth daily.  04/18/19  Yes [provider]  rosuvastatin (CRESTOR) 40 MG tablet Take 40 mg by mouth daily.   Yes [provider]  torsemide (DEMADEX) 20 MG tablet Take 20 mg by mouth daily.   Yes [provider]  furosemide (LASIX) 40 MG tablet Take 1.5 tablets (60 mg total) by mouth 2 (two) times daily. Patient not taking: Reported on 06/18/2019 05/06/19   Bettey Costa, MD   Review of Systems  Constitutional: Negative for appetite change and fatigue.  HENT: Negative for congestion, postnasal drip and sore throat.   Eyes: Negative.   Respiratory: Positive for cough (occasional) and shortness of breath (with moderate exertion). Negative for chest tightness.        + snoring  Cardiovascular: Positive for leg swelling. Negative for chest pain and palpitations.  Gastrointestinal: Negative for abdominal distention and abdominal pain.  Endocrine: Negative.   Genitourinary: Negative.   Musculoskeletal: Positive for arthralgias (legs hurt). Negative for back pain.  Skin: Negative.   Allergic/Immunologic: Negative.   Neurological: Positive for light-headedness (at times when get up too quick).  Negative for dizziness.  Hematological: Negative for adenopathy. Does not bruise/bleed easily.  Psychiatric/Behavioral: Positive for sleep disturbance (sleeping in chair). Negative for dysphoric mood. The patient is nervous/anxious (at times).    Vitals:   06/18/19 1226  BP: (!) 163/84  Pulse: 78  Resp: 16  SpO2: 98%   Filed Weights   06/18/19 1226  Weight: (!) 382 lb 6.4 oz (173.5 kg)   Lab Results  Component Value Date   CREATININE 1.55 (H) 05/06/2019   CREATININE 1.51 (H)  05/05/2019   CREATININE 2.00 (H) 05/04/2019    Physical Exam Vitals signs and nursing note reviewed.  Constitutional:      Appearance: He is well-developed.  HENT:     Head: Normocephalic and atraumatic.  Neck:     Musculoskeletal: Normal range of motion and neck supple.     Vascular: No JVD.  Cardiovascular:     Rate and Rhythm: Normal rate and regular rhythm.  Pulmonary:     Effort: Pulmonary effort is normal. No respiratory distress.     Breath sounds: No wheezing or rales.  Abdominal:     Palpations: Abdomen is soft.     Tenderness: There is no abdominal tenderness.  Musculoskeletal:        General: Tenderness present.     Right lower leg: Edema (+2 pitting) present.     Left lower leg: Edema (+2 pitting ) present.  Skin:    General: Skin is warm and dry.  Neurological:     General: No focal deficit present.     Mental Status: He is alert and oriented to person, place, and time.  Psychiatric:        Mood and Affect: Mood normal. Mood is not anxious.        Behavior: Behavior normal.     Assessment & Plan:  1: Chronic heart failure with preserved ejection fraction- - NYHA class II - euvolemic today - weighing daily and he was instructed to call for an overnight weight gain of >2 pounds or a weekly weight gain of >5 pounds - weight stable from last visit 1 month ago - not adding salt and girlfriend has been reading food labels for sodium content; reinforced the importance of keeping daily sodium intake to 2000mg  / day  - saw cardiology Humphrey Rolls) 05/15/2019 and had lab work drawn - had stress test on 05/22/2019, patient unsure of the results. Goes back to cardiology on Friday 06/20/2019  - wearing oxygen at 2L around the clock - BNP 05/03/2019 was 26.0  2: HTN- - BP elevated today, however he was upset his girlfriend couldn't come up to the appointment with him. Added it to next visit that he can have 1 person come with him. - he checks his BP at home and they range  from [id number] systolic. - does not have a PCP. [facility] - Gave him written diagnoses of heart failure with preserved ejection fraction and lymphedema on prescription for Medicaid.  - BMP 05/06/2019 reviewed and showed sodium 140, potassium 5.2, creatinine 1.55 and GFR 49  3: Lymphedema- - stage 2 - continue with UNNA boots or ACE wrap for swelling - does elevate his legs when sitting for long periods of time - limited in his ability to exercise due to his fatigue - consider lymphapress compression boots if edema continues once he is done with the UNNA wraps  Medication list reviewed.  Return in 3 months or sooner for any questions/problems  before then.

## 2019-06-18 NOTE — Patient Instructions (Signed)
Continue weighing daily and call for an overnight weight gain of > 2 pounds or a weekly weight gain of >5 pounds.  [facility]   [phone]  [place] [address]  Hours: Tuesday: 4:15 pm - 7:30 pm  Wednesday: 9 am - 1 pm  Thursday: 1 pm - 7:30 pm  Friday - Monday: CLOSED

## 2019-09-17 ENCOUNTER — Telehealth: Payer: Self-pay | Admitting: Family

## 2019-09-17 ENCOUNTER — Ambulatory Visit: Payer: Self-pay | Admitting: Family

## 2019-09-17 NOTE — Progress Notes (Deleted)
Patient ID: Aaron Hale, male    DOB: [birth year], 60 y.o.   MRN: [id number]  HPI  Aaron Hale is a 59 y/o male with a history of HTN and chronic heart failure.  Echo report from 05/04/2019 reviewed and showed an EF of 60-65%.  Admitted 05/03/2019 due to acute heart failure. Cardiology and wound consults obtained. Initially given IV lasix and then transitioned to oral diuretics. Given antibiotics for cellulitis. Discharged after 3 days.   He presents today for his follow-up visit with a chief complaint of    Past Medical History:  Diagnosis Date  . CHF (congestive heart failure) ([place])   . Hypertension   . Morbid obesity ([facility])    No past surgical history on file. Family History  Problem Relation Age of Onset  . CAD Mother    Social History   Tobacco Use  . Smoking status: Never Smoker  . Smokeless tobacco: Never Used  Substance Use Topics  . Alcohol use: Not Currently   No Known Allergies   Review of Systems  Constitutional: Negative for appetite change and fatigue.  HENT: Negative for congestion, postnasal drip and sore throat.   Eyes: Negative.   Respiratory: Positive for cough (occasional) and shortness of breath (with moderate exertion). Negative for chest tightness.        + snoring  Cardiovascular: Positive for leg swelling. Negative for chest pain and palpitations.  Gastrointestinal: Negative for abdominal distention and abdominal pain.  Endocrine: Negative.   Genitourinary: Negative.   Musculoskeletal: Positive for arthralgias (legs hurt). Negative for back pain.  Skin: Negative.   Allergic/Immunologic: Negative.   Neurological: Positive for light-headedness (at times when get up too quick). Negative for dizziness.  Hematological: Negative for adenopathy. Does not bruise/bleed easily.  Psychiatric/Behavioral: Positive for sleep disturbance (sleeping in chair). Negative for dysphoric mood. The patient is nervous/anxious (at times).     Physical Exam Vitals and  nursing note reviewed.  Constitutional:      Appearance: He is well-developed.  HENT:     Head: Normocephalic and atraumatic.  Neck:     Vascular: No JVD.  Cardiovascular:     Rate and Rhythm: Normal rate and regular rhythm.  Pulmonary:     Effort: Pulmonary effort is normal. No respiratory distress.     Breath sounds: No wheezing or rales.  Abdominal:     Palpations: Abdomen is soft.     Tenderness: There is no abdominal tenderness.  Musculoskeletal:        General: Tenderness present.     Cervical back: Normal range of motion and neck supple.     Right lower leg: Edema (+2 pitting) present.     Left lower leg: Edema (+2 pitting ) present.  Skin:    General: Skin is warm and dry.  Neurological:     General: No focal deficit present.     Mental Status: He is alert and oriented to person, place, and time.  Psychiatric:        Mood and Affect: Mood normal. Mood is not anxious.        Behavior: Behavior normal.     Assessment & Plan:  1: Chronic heart failure with preserved ejection fraction- - NYHA class II - euvolemic today - weighing daily and he was instructed to call for an overnight weight gain of >2 pounds or a weekly weight gain of >5 pounds - weight 382.6 from last visit 3 months ago - not adding salt and girlfriend has been  reading food labels for sodium content; reinforced the importance of keeping daily sodium intake to 2000mg  / day  - saw cardiology Aaron Hale) 05/15/2019 and had lab work drawn - had stress test on 05/22/2019, patient unsure of the results. Goes back to cardiology on Friday 06/20/2019  - wearing oxygen at 2L around the clock - BNP 05/03/2019 was 26.0  2: HTN- - BP  - does not have a PCP. [facility] - BMP 05/06/2019 reviewed and showed sodium 140, potassium 5.2, creatinine 1.55 and GFR 49  3: Lymphedema- - stage 2 - continue with UNNA boots or ACE wrap for swelling - does elevate his legs when sitting for long  periods of time - limited in his ability to exercise due to his fatigue - consider lymphapress compression boots if edema continues once he is done with the UNNA wraps  Medication list reviewed.

## 2019-09-17 NOTE — Telephone Encounter (Signed)
Patient did not show for his [facility] appointment on 07/07/20. Will attempt to reschedule.  °

## 2019-09-23 ENCOUNTER — Encounter: Payer: Self-pay | Admitting: Emergency Medicine

## 2019-09-23 ENCOUNTER — Other Ambulatory Visit: Payer: Self-pay

## 2019-09-23 ENCOUNTER — Emergency Department
Admission: EM | Admit: 2019-09-23 | Discharge: 2019-09-23 | Disposition: A | Discharge status: Home / Self Care | Payer: Self-pay | Attending: Emergency Medicine | Admitting: Emergency Medicine

## 2019-09-23 DIAGNOSIS — R040 Epistaxis: Secondary | ICD-10-CM | POA: Insufficient documentation

## 2019-09-23 DIAGNOSIS — Z9981 Dependence on supplemental oxygen: Secondary | ICD-10-CM | POA: Insufficient documentation

## 2019-09-23 DIAGNOSIS — I509 Heart failure, unspecified: Secondary | ICD-10-CM | POA: Insufficient documentation

## 2019-09-23 DIAGNOSIS — Z7982 Long term (current) use of aspirin: Secondary | ICD-10-CM | POA: Insufficient documentation

## 2019-09-23 DIAGNOSIS — I110 Hypertensive heart disease with heart failure: Secondary | ICD-10-CM | POA: Insufficient documentation

## 2019-09-23 DIAGNOSIS — Z79899 Other long term (current) drug therapy: Secondary | ICD-10-CM | POA: Insufficient documentation

## 2019-09-23 MED ORDER — OXYMETAZOLINE HCL 0.05 % NA SOLN: 2.0000 | Freq: Two times a day (BID) | NASAL | 0 refills | Status: AC

## 2019-09-23 NOTE — ED Provider Notes (Signed)
[facility] Emergency Department Provider Note  ____________________________________________  Time seen: Approximately 12:33 PM  I have reviewed the triage vital signs and the nursing notes.   HISTORY  Chief Complaint Epistaxis    HPI Aaron Hale is a 59 y.o. male with a history of CHF hypertension and obesity who comes the ED complaining of bleeding to the right side of his nose that started about 9 AM today.  Notes that the air inside his house has been dry recently.  He is also been using a nasal cannula for supplemental oxygen over the past few months.  Denies any anticoagulant use.  No other acute symptoms.  No fevers chills or trauma.  No chest pain or shortness of breath.  No aggravating or alleviating factors since onset.  EMS gave the patient oxymetazoline 1 spray in both nostrils.      Past Medical History:  Diagnosis Date  . CHF (congestive heart failure) ([place])   . Hypertension   . Morbid obesity [facility])      Patient Active Problem List   Diagnosis Date Noted  . Cellulitis 05/03/2019     History reviewed. No pertinent surgical history.   Prior to Admission medications   Medication Sig Start Date End Date Taking? Authorizing Provider  aspirin 81 MG chewable tablet Chew by mouth daily.    [provider]  carvedilol (COREG) 6.25 MG tablet Take 12.5 mg by mouth 2 (two) times daily with a meal.     [provider]  furosemide (LASIX) 40 MG tablet Take 1.5 tablets (60 mg total) by mouth 2 (two) times daily. Patient not taking: Reported on 06/18/2019 05/06/19   Bettey Costa, MD  losartan (COZAAR) 50 MG tablet Take 100 mg by mouth daily.  04/18/19   [provider]  oxymetazoline (AFRIN) 0.05 % nasal spray Place 2 sprays into right nostril 2 (two) times daily for 3 days. 09/23/19 09/26/19  Carrie Mew, MD  rosuvastatin (CRESTOR) 40 MG tablet Take 40 mg by mouth daily.    [provider]  torsemide (DEMADEX)  20 MG tablet Take 20 mg by mouth daily.    [provider]     Allergies Patient has no known allergies.   Family History  Problem Relation Age of Onset  . CAD Mother     Social History Social History   Tobacco Use  . Smoking status: Never Smoker  . Smokeless tobacco: Never Used  Substance Use Topics  . Alcohol use: Not Currently  . Drug use: No    Review of Systems  Constitutional:   No fever or chills.  ENT:   No sore throat. No rhinorrhea.  Nosebleed as above Cardiovascular:   No chest pain or syncope. Respiratory:   No dyspnea or cough. Gastrointestinal:   Negative for abdominal pain, vomiting and diarrhea.  Musculoskeletal:   Negative for focal pain or swelling All other systems reviewed and are negative except as documented above in ROS and HPI.  ____________________________________________   PHYSICAL EXAM:  VITAL SIGNS: ED Triage Vitals  Enc Vitals Group     BP 09/23/19 0955 129/68     Pulse Rate 09/23/19 0955 88     Resp 09/23/19 0955 (!) 22     Temp 09/23/19 0955 98.5 F (36.9 C)     Temp Source 09/23/19 0955 Oral     SpO2 09/23/19 0955 100 %     Weight 09/23/19 0949 (!) 354 lb (160.6 kg)     Height  09/23/19 0949 6' (1.829 m)     Head Circumference --      Peak Flow --      Pain Score 09/23/19 0949 0     Pain Loc --      Pain Edu? --      Excl. in [place]? --     Vital signs reviewed, nursing assessments reviewed.   Constitutional:   Alert and oriented. Non-toxic appearance.  Morbidly obese Eyes:   Conjunctivae are normal. EOMI. ENT      Head:   Normocephalic and atraumatic. Nose: Left nostril normal.  Right nostril with abraded mucosa in the anterior septum with some adherent clot.  Deeper nostril is normal in appearance.      Mouth/Throat:   MMM.  No blood in oropharynx      Neck:   No meningismus. Full ROM. Hematological/Lymphatic/Immunilogical:   No cervical lymphadenopathy. Cardiovascular:   RRR. Symmetric bilateral radial and DP  pulses.  No murmurs. Cap refill less than 2 seconds. Respiratory:   Normal respiratory effort without tachypnea/retractions. Breath sounds are clear and equal bilaterally. No wheezes/rales/rhonchi.  Musculoskeletal:   Normal range of motion in all extremities.  No edema. Neurologic:   Normal speech and language.  Motor grossly intact. No acute focal neurologic deficits are appreciated.  Skin:    Skin is warm, dry and intact. No rash noted.  No wounds.  ____________________________________________    LABS (pertinent positives/negatives) (all labs ordered are listed, but only abnormal results are displayed) Labs Reviewed - No data to display ____________________________________________   EKG  ____________________________________________    RADIOLOGY  No results found.  ____________________________________________   PROCEDURES Procedures  ____________________________________________  CLINICAL IMPRESSION / ASSESSMENT AND PLAN / ED COURSE  Pertinent labs & imaging results that were available during my care of the patient were reviewed by me and considered in my medical decision making (see chart for details).  Crystopher Durst was evaluated in Emergency Department on 09/23/2019 for the symptoms described in the history of present illness. He was evaluated in the context of the global COVID-19 pandemic, which necessitated consideration that the patient might be at risk for infection with the SARS-CoV-2 virus that causes COVID-19. Institutional protocols and algorithms that pertain to the evaluation of patients at risk for COVID-19 are in a state of rapid change based on information released by regulatory bodies including the CDC and federal and state organizations. These policies and algorithms were followed during the patient's care in the ED.   Patient presents with anterior epistaxis.  After being given Afrin by EMS and pinching his nostrils, bleeding has stopped on arrival to the ED.   He was observed in the ED for a few hours, no recurrence of bleeding.  No evidence of any arterial bleed or acute blood loss anemia.  Vital signs are normal.  Continue Afrin at home, counseled on nasal care, some petroleum jelly, humidifier, repositioning his nasal cannula for now until healed.  He will contact his primary care doctor to see about getting a humidified nasal cannula oxygen delivery device.      ____________________________________________   FINAL CLINICAL IMPRESSION(S) / ED DIAGNOSES    Final diagnoses:  Anterior epistaxis     ED Discharge Orders         Ordered    oxymetazoline (AFRIN) 0.05 % nasal spray  2 times daily     09/23/19 1233          Portions of this note were generated with dragon  dictation software. Dictation errors may occur despite best attempts at proofreading.   Carrie Mew, MD 09/23/19 1236

## 2019-09-23 NOTE — ED Triage Notes (Signed)
Pt here for epistaxis. Bleeding started about 35-45 minutes ago. No blood thinners. Pt given afrin both nare by EMS.  Still having some scant bleeding.  NAD. VSS

## 2019-09-23 NOTE — ED Notes (Signed)
Pt voiding in triage bathroom.

## 2019-09-24 ENCOUNTER — Encounter (HOSPITAL_COMMUNITY): Payer: Self-pay | Admitting: Emergency Medicine

## 2019-09-24 ENCOUNTER — Emergency Department (HOSPITAL_COMMUNITY)
Admission: EM | Admit: 2019-09-24 | Discharge: 2019-09-24 | Disposition: A | Discharge status: Home / Self Care | Payer: Self-pay | Attending: Emergency Medicine | Admitting: Emergency Medicine

## 2019-09-24 DIAGNOSIS — R231 Pallor: Secondary | ICD-10-CM | POA: Insufficient documentation

## 2019-09-24 DIAGNOSIS — I110 Hypertensive heart disease with heart failure: Secondary | ICD-10-CM | POA: Insufficient documentation

## 2019-09-24 DIAGNOSIS — R040 Epistaxis: Secondary | ICD-10-CM | POA: Insufficient documentation

## 2019-09-24 DIAGNOSIS — I509 Heart failure, unspecified: Secondary | ICD-10-CM | POA: Insufficient documentation

## 2019-09-24 DIAGNOSIS — Z7982 Long term (current) use of aspirin: Secondary | ICD-10-CM | POA: Insufficient documentation

## 2019-09-24 DIAGNOSIS — Z79899 Other long term (current) drug therapy: Secondary | ICD-10-CM | POA: Insufficient documentation

## 2019-09-24 LAB — CBC WITH DIFFERENTIAL/PLATELET
Abs Immature Granulocytes: 0.14 10*3/uL — ABNORMAL HIGH (ref 0.00–0.07)
Basophils Absolute: 0.1 10*3/uL (ref 0.0–0.1)
Basophils Relative: 1 %
Eosinophils Absolute: 0.5 10*3/uL (ref 0.0–0.5)
Eosinophils Relative: 2 %
HCT: 32.2 % — ABNORMAL LOW (ref 39.0–52.0)
Hemoglobin: 10.4 g/dL — ABNORMAL LOW (ref 13.0–17.0)
Immature Granulocytes: 1 %
Lymphocytes Relative: 4 %
Lymphs Abs: 0.7 10*3/uL (ref 0.7–4.0)
MCH: 30.1 pg (ref 26.0–34.0)
MCHC: 32.3 g/dL (ref 30.0–36.0)
MCV: 93.1 fL (ref 80.0–100.0)
Monocytes Absolute: 1.2 10*3/uL — ABNORMAL HIGH (ref 0.1–1.0)
Monocytes Relative: 6 %
Neutro Abs: 16.9 10*3/uL — ABNORMAL HIGH (ref 1.7–7.7)
Neutrophils Relative %: 86 %
Platelets: 291 10*3/uL (ref 150–400)
RBC: 3.46 MIL/uL — ABNORMAL LOW (ref 4.22–5.81)
RDW: 14.3 % (ref 11.5–15.5)
WBC: 19.5 10*3/uL — ABNORMAL HIGH (ref 4.0–10.5)
nRBC: 0 % (ref 0.0–0.2)

## 2019-09-24 LAB — BASIC METABOLIC PANEL
Anion gap: 10 (ref 5–15)
BUN: 65 mg/dL — ABNORMAL HIGH (ref 6–20)
CO2: 26 mmol/L (ref 22–32)
Calcium: 9.1 mg/dL (ref 8.9–10.3)
Chloride: 96 mmol/L — ABNORMAL LOW (ref 98–111)
Creatinine, Ser: 2.61 mg/dL — ABNORMAL HIGH (ref 0.61–1.24)
GFR calc Af Amer: 30 mL/min — ABNORMAL LOW (ref >60)
GFR calc non Af Amer: 26 mL/min — ABNORMAL LOW (ref >60)
Glucose, Bld: 146 mg/dL — ABNORMAL HIGH (ref 70–99)
Potassium: 5 mmol/L (ref 3.5–5.1)
Sodium: 132 mmol/L — ABNORMAL LOW (ref 135–145)

## 2019-09-24 MED ORDER — OXYMETAZOLINE HCL 0.05 % NA SOLN
1.0000 | Freq: Once | NASAL | Status: AC
  Administered 2019-09-24: 1 via NASAL

## 2019-09-24 MED ORDER — LIDOCAINE-EPINEPHRINE (PF) 2 %-1:200000 IJ SOLN
10.0000 mL | Freq: Once | INTRAMUSCULAR | Status: AC
  Administered 2019-09-24: 10 mL

## 2019-09-24 MED ORDER — SODIUM CHLORIDE 0.9 % IV BOLUS
1000.0000 mL | Freq: Once | INTRAVENOUS | Status: AC
  Administered 2019-09-24: 1000 mL via INTRAVENOUS

## 2019-09-24 MED ORDER — AMOXICILLIN-POT CLAVULANATE 875-125 MG PO TABS: 1.0000 | ORAL_TABLET | Freq: Two times a day (BID) | ORAL | 0 refills | Status: AC

## 2019-09-24 NOTE — ED Provider Notes (Signed)
[facility] EMERGENCY DEPARTMENT Provider Note   CSN: [id number] Arrival date & time: 09/24/19  0315     History No chief complaint on file.   Aaron Hale is a 59 y.o. male.  59 yo M with a chief complaints of epistaxis.  Had it happen to him at least 4 times today.  Resolved with direct pressure and Afrin.  Unfortunately he was unable to control it this evening so ended up calling 911.  At home they tried Afrin and direct pressure for 15 minutes without improvement so brought him here.  Not on blood thinners.  Old records were reviewed and the patient was recently seen at [facility] for the same.  At that time the bleeding was controlled and he was discharged home.  The history is provided by the patient.  Illness Severity:  Moderate Onset quality:  Gradual Duration:  2 days Timing:  Constant Progression:  Worsening Chronicity:  New Associated symptoms: no abdominal pain, no chest pain, no congestion, no diarrhea, no fever, no headaches, no myalgias, no rash, no shortness of breath and no vomiting        Past Medical History:  Diagnosis Date  . CHF (congestive heart failure) ([place])   . Hypertension   . Morbid obesity [facility])     Patient Active Problem List   Diagnosis Date Noted  . Cellulitis 05/03/2019    History reviewed. No pertinent surgical history.     Family History  Problem Relation Age of Onset  . CAD Mother     Social History   Tobacco Use  . Smoking status: Never Smoker  . Smokeless tobacco: Never Used  Substance Use Topics  . Alcohol use: Not Currently  . Drug use: No    Home Medications Prior to Admission medications   Medication Sig Start Date End Date Taking? Authorizing Provider  aspirin 81 MG chewable tablet Chew by mouth daily.    [provider]  carvedilol (COREG) 6.25 MG tablet Take 12.5 mg by mouth 2 (two) times daily with a meal.     [provider]  furosemide (LASIX) 40 MG tablet Take  1.5 tablets (60 mg total) by mouth 2 (two) times daily. Patient not taking: Reported on 06/18/2019 05/06/19   Bettey Costa, MD  losartan (COZAAR) 50 MG tablet Take 100 mg by mouth daily.  04/18/19   [provider]  oxymetazoline (AFRIN) 0.05 % nasal spray Place 2 sprays into right nostril 2 (two) times daily for 3 days. 09/23/19 09/26/19  Carrie Mew, MD  rosuvastatin (CRESTOR) 40 MG tablet Take 40 mg by mouth daily.    [provider]  torsemide (DEMADEX) 20 MG tablet Take 20 mg by mouth daily.    [provider]    Allergies    Patient has no known allergies.  Review of Systems   Review of Systems  Constitutional: Negative for chills and fever.  HENT: Positive for nosebleeds. Negative for congestion and facial swelling.   Eyes: Negative for discharge and visual disturbance.  Respiratory: Negative for shortness of breath.   Cardiovascular: Negative for chest pain and palpitations.  Gastrointestinal: Negative for abdominal pain, diarrhea and vomiting.  Musculoskeletal: Negative for arthralgias and myalgias.  Skin: Negative for color change and rash.  Neurological: Negative for tremors, syncope and headaches.  Psychiatric/Behavioral: Negative for confusion and dysphoric mood.    Physical Exam Updated Vital Signs BP (!) 126/50   Pulse 82   Temp 97.6 F (36.4 C) (Axillary)  Resp 20   SpO2 100%   Physical Exam Vitals and nursing note reviewed.  Constitutional:      Appearance: He is well-developed.  HENT:     Head: Normocephalic and atraumatic.     Comments: Mild pallor    Nose:     Comments: Increased vascularity along Kiesselbach's plexus on the right without active bleeding.  There is a large clot along the turbinates.  No active bleeding is seen. Eyes:     Pupils: Pupils are equal, round, and reactive to light.  Neck:     Vascular: No JVD.  Cardiovascular:     Rate and Rhythm: Normal rate and regular rhythm.     Heart sounds: No murmur. No  friction rub. No gallop.   Pulmonary:     Effort: No respiratory distress.     Breath sounds: No wheezing.  Abdominal:     General: There is no distension.     Tenderness: There is no guarding or rebound.  Musculoskeletal:        General: Normal range of motion.     Cervical back: Normal range of motion and neck supple.  Skin:    Coloration: Skin is not pale.     Findings: No rash.  Neurological:     Mental Status: He is alert and oriented to person, place, and time.  Psychiatric:        Behavior: Behavior normal.     ED Results / Procedures / Treatments   Labs (all labs ordered are listed, but only abnormal results are displayed) Labs Reviewed  CBC WITH DIFFERENTIAL/PLATELET - Abnormal; Notable for the following components:      Result Value   WBC 19.5 (*)    RBC 3.46 (*)    Hemoglobin 10.4 (*)    HCT 32.2 (*)    Neutro Abs 16.9 (*)    Monocytes Absolute 1.2 (*)    Abs Immature Granulocytes 0.14 (*)    All other components within normal limits  BASIC METABOLIC PANEL - Abnormal; Notable for the following components:   Sodium 132 (*)    Chloride 96 (*)    Glucose, Bld 146 (*)    BUN 65 (*)    Creatinine, Ser 2.61 (*)    GFR calc non Af Amer 26 (*)    GFR calc Af Amer 30 (*)    All other components within normal limits    EKG None  Radiology No results found.  Procedures .Epistaxis Management  Date/Time: 09/24/2019 4:16 AM Performed by: Deno Etienne, DO Authorized by: Deno Etienne, DO   Consent:    Consent obtained:  Verbal   Consent given by:  Patient   Risks discussed:  Bleeding, infection and nasal injury   Alternatives discussed:  No treatment, delayed treatment and alternative treatment Anesthesia (see MAR for exact dosages):    Anesthesia method:  Topical application   Topical anesthetic:  Lidocaine gel and epinephrine Procedure details:    Treatment site:  R anterior   Treatment method:  Nasal tampon   Treatment complexity:  Limited   Treatment  episode: recurring   Post-procedure details:    Assessment:  Bleeding stopped   Patient tolerance of procedure:  Tolerated well, no immediate complications Comments:     Applied a cotton pledget soaked in lidocaine with epinephrine and Afrin.  Direct pressure was held for 15 minutes.  No active bleeding afterwards.   (including critical care time)  Medications Ordered in ED Medications  oxymetazoline (AFRIN) 0.05 %  nasal spray 1 spray (1 spray Each Nare Given by Other 09/24/19 0329)  lidocaine-EPINEPHrine (XYLOCAINE W/EPI) 2 %-1:200000 (PF) injection 10 mL (10 mLs Other Given 09/24/19 0329)  sodium chloride 0.9 % bolus 1,000 mL (0 mLs Intravenous Stopped 09/24/19 [id number])    ED Course  I have reviewed the triage vital signs and the nursing notes.  Pertinent labs & imaging results that were available during my care of the patient were reviewed by me and considered in my medical decision making (see chart for details).    MDM Rules/Calculators/A&P                      59 yo M with a chief complaint of a nosebleed.  Off and on all day today.  Controlled with direct pressure Afrin and lidocaine with epinephrine.  The patient had some downtrending of his blood pressure and had some mild tachycardia.  Will obtain a CBC.  Give a bolus of IV fluids.  Hemoglobin with very mild anemia.  Patient is continuing to not have any bleeding and is able to ambulate without difficulty.  Given ENT follow-up.  6:51 AM:  I have discussed the diagnosis/risks/treatment options with the patient and believe the pt to be eligible for discharge home to follow-up with ENT. We also discussed returning to the ED immediately if new or worsening sx occur. We discussed the sx which are most concerning (e.g., bleeding that does not resolve with direct pressure) that necessitate immediate return. Medications administered to the patient during their visit and any new prescriptions provided to the patient are listed  below.  Medications given during this visit Medications  oxymetazoline (AFRIN) 0.05 % nasal spray 1 spray (1 spray Each Nare Given by Other 09/24/19 0329)  lidocaine-EPINEPHrine (XYLOCAINE W/EPI) 2 %-1:200000 (PF) injection 10 mL (10 mLs Other Given 09/24/19 0329)  sodium chloride 0.9 % bolus 1,000 mL (0 mLs Intravenous Stopped 09/24/19 [id number])     The patient appears reasonably screen and/or stabilized for discharge and I doubt any other medical condition or other [facility] requiring further screening, evaluation, or treatment in the ED at this time prior to discharge.   Final Clinical Impression(s) / ED Diagnoses Final diagnoses:  Epistaxis    Rx / DC Orders ED Discharge Orders    None       Deno Etienne, DO 09/24/19 [phone]

## 2019-09-24 NOTE — Discharge Instructions (Addendum)
You are seen today for a nosebleed.  After several treatments and several recurrences of nosebleed we have decided to place packing in your nose.  This is to remain in place until you follow-up with ENT.  You need to follow-up with ENT in 2 to 3 days.  Please take the antibiotics until you follow-up with them. Thank you for allowing me to care for you today. Please return to the emergency department if you have new or worsening symptoms. Take your medications as instructed.

## 2019-09-24 NOTE — ED Notes (Signed)
Pt's nose is bleeding while waiting for his ride-- will undischarge and place patient in bed to be re-assessed.

## 2019-09-24 NOTE — ED Provider Notes (Addendum)
[facility] EMERGENCY DEPARTMENT Provider Note   CSN: [id number] Arrival date & time: 09/24/19  0315     History No chief complaint on file.   Aaron Hale is a 59 y.o. male.  Patient is a 59 year old male with past medical history of hypertension, obesity and CHF on home oxygen who presents emergency department for epistasis.  This is patient's third emergency department visit in the last 48 hours for the same.  He has been treated with silver nitrate, lidocaine and epinephrine.  He has had resolution of symptoms but right after being discharged here just a few hours ago he reports he is in the parking lot and the symptoms began again.  Has an anterior bleed in the right nare.        Past Medical History:  Diagnosis Date  . CHF (congestive heart failure) ([place])   . Hypertension   . Morbid obesity [facility])     Patient Active Problem List   Diagnosis Date Noted  . Cellulitis 05/03/2019    History reviewed. No pertinent surgical history.     Family History  Problem Relation Age of Onset  . CAD Mother     Social History   Tobacco Use  . Smoking status: Never Smoker  . Smokeless tobacco: Never Used  Substance Use Topics  . Alcohol use: Not Currently  . Drug use: No    Home Medications Prior to Admission medications   Medication Sig Start Date End Date Taking? Authorizing Provider  amoxicillin-clavulanate (AUGMENTIN) 875-125 MG tablet Take 1 tablet by mouth 2 (two) times daily for 5 days. 09/24/19 09/29/19  Alveria Apley, PA-C  aspirin 81 MG chewable tablet Chew by mouth daily.    [provider]  carvedilol (COREG) 6.25 MG tablet Take 12.5 mg by mouth 2 (two) times daily with a meal.     [provider]  furosemide (LASIX) 40 MG tablet Take 1.5 tablets (60 mg total) by mouth 2 (two) times daily. Patient not taking: Reported on 06/18/2019 05/06/19   Bettey Costa, MD  losartan (COZAAR) 50 MG tablet Take 100 mg by mouth daily.  04/18/19    [provider]  oxymetazoline (AFRIN) 0.05 % nasal spray Place 2 sprays into right nostril 2 (two) times daily for 3 days. 09/23/19 09/26/19  Carrie Mew, MD  rosuvastatin (CRESTOR) 40 MG tablet Take 40 mg by mouth daily.    [provider]  torsemide (DEMADEX) 20 MG tablet Take 20 mg by mouth daily.    [provider]    Allergies    Patient has no known allergies.  Review of Systems   Review of Systems  Constitutional: Negative for chills and fever.  HENT: Positive for nosebleeds. Negative for postnasal drip and rhinorrhea.   Respiratory: Negative for cough and shortness of breath.   Gastrointestinal: Negative for nausea and vomiting.  Neurological: Negative for dizziness, light-headedness and headaches.    Physical Exam Updated Vital Signs BP (!) 126/50   Pulse 82   Temp 97.6 F (36.4 C) (Axillary)   Resp 20   SpO2 100%   Physical Exam Vitals and nursing note reviewed.  Constitutional:      General: He is not in acute distress.    Appearance: Normal appearance. He is obese. He is not ill-appearing, toxic-appearing or diaphoretic.  HENT:     Head: Normocephalic.     Nose:     Right Nostril: Epistaxis present. No foreign body, septal hematoma or occlusion.  Left Nostril: No epistaxis.     Comments: Right side anterior nose bleed    Mouth/Throat:     Mouth: Mucous membranes are moist.  Eyes:     Conjunctiva/sclera: Conjunctivae normal.  Pulmonary:     Effort: Pulmonary effort is normal.  Skin:    General: Skin is dry.  Neurological:     Mental Status: He is alert.  Psychiatric:        Mood and Affect: Mood normal.     ED Results / Procedures / Treatments   Labs (all labs ordered are listed, but only abnormal results are displayed) Labs Reviewed  CBC WITH DIFFERENTIAL/PLATELET - Abnormal; Notable for the following components:      Result Value   WBC 19.5 (*)    RBC 3.46 (*)    Hemoglobin 10.4 (*)    HCT 32.2 (*)     Neutro Abs 16.9 (*)    Monocytes Absolute 1.2 (*)    Abs Immature Granulocytes 0.14 (*)    All other components within normal limits  BASIC METABOLIC PANEL - Abnormal; Notable for the following components:   Sodium 132 (*)    Chloride 96 (*)    Glucose, Bld 146 (*)    BUN 65 (*)    Creatinine, Ser 2.61 (*)    GFR calc non Af Amer 26 (*)    GFR calc Af Amer 30 (*)    All other components within normal limits    EKG None  Radiology No results found.  Procedures .Epistaxis Management  Date/Time: 09/24/2019 8:46 AM Performed by: Alveria Apley, PA-C Authorized by: Alveria Apley, PA-C   Consent:    Consent obtained:  Verbal   Consent given by:  Patient   Risks discussed:  Bleeding, infection, nasal injury and pain   Alternatives discussed:  No treatment and referral Anesthesia (see MAR for exact dosages):    Anesthesia method:  Topical application   Topical anesthetic:  Epinephrine and lidocaine gel Procedure details:    Treatment site:  R anterior   Treatment method:  Anterior pack   Treatment complexity:  Limited   Treatment episode: recurring   Post-procedure details:    Assessment:  Bleeding stopped   Patient tolerance of procedure:  Tolerated well, no immediate complications   (including critical care time)  Medications Ordered in ED Medications  oxymetazoline (AFRIN) 0.05 % nasal spray 1 spray (1 spray Each Nare Given by Other 09/24/19 0329)  lidocaine-EPINEPHrine (XYLOCAINE W/EPI) 2 %-1:200000 (PF) injection 10 mL (10 mLs Other Given 09/24/19 0329)  sodium chloride 0.9 % bolus 1,000 mL (0 mLs Intravenous Stopped 09/24/19 [id number])    ED Course  I have reviewed the triage vital signs and the nursing notes.  Pertinent labs & imaging results that were available during my care of the patient were reviewed by me and considered in my medical decision making (see chart for details).  Clinical Course as of Sep 23 846  Wed Sep 24, 2019  0847 Patient returns to ED  after just being discharged for epistasis.  Multiple recurring epistasis episodes in the last 48 hours.  Labs drawn earlier and reviewed by myself and Dr. Rex Kras.  Case discussed with Dr. Rex Kras and agree that patient has failed lidocaine with epi, Afrin, silver nitrate.  Will pack and started on antibiotics.  Patient in agreement with plan.  Follow-up with ENT in 2 to 3 days.  Advised on strict return precautions.   [KM]    Clinical Course  User Index [KM] Kristine Royal   MDM Rules/Calculators/A&P                       Counseled pt on good return precautions and encouraged both PCP and ED follow-up as needed.  Prior to discharge, I also discussed incidental imaging findings with patient in detail and advised appropriate, recommended follow-up in detail.  Clinical Impression: 1. Epistaxis     Disposition: Discharge  Prior to providing a prescription for a controlled substance, I independently reviewed the patient's recent prescription history on the [place]. The patient had no recent or regular prescriptions and was deemed appropriate for a brief, less than 3 day prescription of narcotic for acute analgesia.  This note was prepared with assistance of Systems analyst. Occasional wrong-word or sound-a-like substitutions may have occurred due to the inherent limitations of voice recognition software.  Final Clinical Impression(s) / ED Diagnoses Final diagnoses:  Epistaxis    Rx / DC Orders ED Discharge Orders         Ordered    amoxicillin-clavulanate (AUGMENTIN) 875-125 MG tablet  2 times daily     09/24/19 0841           Alveria Apley, PA-C 09/24/19 0848    Alveria Apley, PA-C 09/24/19 1056    Little, Wenda Overland, MD 09/24/19 1133

## 2019-09-24 NOTE — ED Triage Notes (Signed)
Pt here from home with an recurrent nose bleed , pt is on O2 otc  For chf , pt got afrin from ems and held pressure for 20 mins without relief ,

## 2019-09-25 ENCOUNTER — Telehealth: Payer: Self-pay | Admitting: Pharmacy Technician

## 2019-09-25 NOTE — Telephone Encounter (Signed)
Patient failed to provide requested 2020 financial documentation. No additional medication assistance will be provided by [facility] without the required proof of income documentation. Patient notified by letter  Barbie Croston CPhT [facility] 

## 2019-11-16 NOTE — Progress Notes (Signed)
Cardiology Office Note:    Date:  11/18/2019   ID:  Aaron Hale, DOB [birth year], MRN [id number]  PCP:  Patient, [name]  Cardiologist:  No primary care provider on file.  Electrophysiologist:  None   Referring MD: Aretta Nip, MD   Chief Complaint  Patient presents with  . Shortness of Breath    History of Present Illness:    Aaron Hale is a 59 y.o. male with a hx of HFpEF, hypertension, obesity, CKD stage IV who is referred by Dr. Radene Ou for evaluation of HFpEF.  TTE 05/04/2019 showed normal LV systolic function, moderate LVH, grade 1 diastolic dysfunction, normal RV function, no significant valvular disease.  He has been started on torsemide and has lost about 60 pounds over the last 6 months.  He has been taking spironolactone 25 mg daily.  He is on 2 L O2 all the time.  Denies any chest pain.  No smoking history.  Father died of MI in 9s.  He has an appointment next week with Dr. Hollie Salk in nephrology.   Past Medical History:  Diagnosis Date  . CHF (congestive heart failure) ([place])   . Hypertension   . Morbid obesity ([place])     History reviewed. No pertinent surgical history.  Current Medications: Current Meds  Medication Sig  . aspirin 81 MG chewable tablet Chew by mouth daily.  . carvedilol (COREG) 6.25 MG tablet Take 12.5 mg by mouth 2 (two) times daily with a meal.   . losartan (COZAAR) 50 MG tablet Take 100 mg by mouth daily.   . rosuvastatin (CRESTOR) 10 MG tablet Take 1 tablet (10 mg total) by mouth daily.  Marland Kitchen torsemide (DEMADEX) 20 MG tablet Take 20 mg by mouth daily.  . [DISCONTINUED] rosuvastatin (CRESTOR) 40 MG tablet Take 40 mg by mouth daily.  . [DISCONTINUED] spironolactone (ALDACTONE) 25 MG tablet Take 25 mg by mouth daily.     Allergies:   Patient has no known allergies.   Social History   Socioeconomic History  . Marital status: Single    Spouse name: Not on file  . Number of children: Not on file  . Years of education: Not on file  .  Highest education level: Not on file  Occupational History  . Occupation: retired  Tobacco Use  . Smoking status: Never Smoker  . Smokeless tobacco: Never Used  Substance and Sexual Activity  . Alcohol use: Not Currently  . Drug use: No  . Sexual activity: Not Currently  Other Topics Concern  . Not on file  Social History Narrative  . Not on file   Social Determinants of Health   Financial Resource Strain: Low Risk   . Difficulty of Paying Living Expenses: Not hard at all  Food Insecurity: Unknown  . Worried About Charity fundraiser in the Last Year: Patient refused  . Ran Out of Food in the Last Year: Patient refused  Transportation Needs: No Transportation Needs  . Lack of Transportation (Medical): No  . Lack of Transportation (Non-Medical): No  Physical Activity: Inactive  . Days of Exercise per Week: 0 days  . Minutes of Exercise per Session: 0 min  Stress: Stress Concern Present  . Feeling of Stress : To some extent  Social Connections: Unknown  . Frequency of Communication with Friends and Family: Never  . Frequency of Social Gatherings with Friends and Family: Never  . Attends Religious Services: Never  . Active Member of Clubs or Organizations: No  .  Attends Archivist Meetings: Never  . Marital Status: Not on file     Family History: The patient's family history includes CAD in his mother.  ROS:   Please see the history of present illness.     All other systems reviewed and are negative.  EKGs/Labs/Other Studies Reviewed:    The following studies were reviewed today:   EKG:  EKG is  ordered today.  The ekg ordered today demonstrates normal sinus rhythm, rate 80, poor R wave progression, incomplete left bundle branch block  TTE 05/04/19: 1. The left ventricle has normal systolic function with an ejection  fraction of 60-65%. The cavity size was normal. There is moderately  increased left ventricular wall thickness. Left ventricular diastolic   Doppler parameters are consistent with impaired  relaxation. No evidence of left ventricular regional wall motion  abnormalities.  2. The right ventricle has normal systolic function. The cavity was  normal. There is no increase in right ventricular wall thickness.  3. The mitral valve is grossly normal.  4. The tricuspid valve is grossly normal.  5. The aortic valve is tricuspid. Aortic valve regurgitation was not  assessed by color flow Doppler.  6. The aorta is normal unless otherwise noted.   Recent Labs: 05/03/2019: ALT 29; B Natriuretic Peptide 26.0 09/24/2019: BUN 65; Creatinine, Ser 2.61; Hemoglobin 10.4; Platelets 291; Potassium 5.0; Sodium 132  Recent Lipid Panel No results found for: CHOL, TRIG, HDL, CHOLHDL, VLDL, LDLCALC, LDLDIRECT  Physical Exam:    VS:  BP 124/74   Pulse 80   Temp (!) 97 F (36.1 C)   Ht 5\' 10"  (1.778 m)   Wt (!) 343 lb (155.6 kg)   SpO2 91%   BMI 49.22 kg/m     Wt Readings from Last 3 Encounters:  11/18/19 (!) 343 lb (155.6 kg)  09/23/19 (!) 354 lb (160.6 kg)  06/18/19 (!) 382 lb 6.4 oz (173.5 kg)     GEN: in no acute distress, on 2L [place] HEENT: Normal NECK: No JVD appreciated, but difficult to assess given body habitus LYMPHATICS: No lymphadenopathy CARDIAC: RRR, no murmurs, rubs, gallops RESPIRATORY: Bibasilar crackles ABDOMEN: Soft, non-tender, non-distended MUSCULOSKELETAL:  trace edema; No deformity  SKIN: Warm and dry NEUROLOGIC:  Alert and oriented x 3 PSYCHIATRIC:  Normal affect   ASSESSMENT:    1. Chronic diastolic heart failure ([place])   2. Chronic respiratory insufficiency   3. Essential hypertension   4. Hypoxia   5. Medication management   6. Witnessed apneic spells   7. Nocturia   8. Excessive daytime sleepiness   9. Snoring   10. Chronic kidney disease (CKD), stage IV (severe) ([place])   11. Hyperlipidemia, unspecified hyperlipidemia type    PLAN:    Chronic respiratory insufficiency: On 2L O2.  Has been  attributed to HFpEF, which may be contributing but had only mild diastolic dysfunction on most recent TTE.  Concern for underlying pulmonary disease.  Suspect OHS/OSA  -Referred to pulmonology for further evaluation -Check sleep study -Continue torsemide 20 mg daily for now.  He does not appear significantly volume overloaded on exam, but volume status difficult to assess given his body habitus.  Will check CMP, BNP  Hypertension: On carvedilol 12.5 mg twice daily, losartan 100 mg daily.  Appears controlled  CKD stage IV: Worsening renal function, creatinine up to 3.1 on labs with PCP on 3/2. -Hold spironolactone given creatinine 3.1, potassium 5.5 on recent labs.  Will check CMP, BNP -Has appointment with  nephrology next week  Hyperlipidemia: On rosuvastatin 40 mg daily, LDL 31.  Will back off rosuvastatin, decrease dose to 10 mg daily  Snoring: order sleep study  RTC in 1 month   Medication Adjustments/Labs and Tests Ordered: Current medicines are reviewed at length with the patient today.  Concerns regarding medicines are outlined above.  Orders Placed This Encounter  Procedures  . Comprehensive metabolic panel  . Brain natriuretic peptide  . Ambulatory referral to Pulmonology  . EKG 12-Lead  . Split night study   Meds ordered this encounter  Medications  . rosuvastatin (CRESTOR) 10 MG tablet    Sig: Take 1 tablet (10 mg total) by mouth daily.    Dispense:  90 tablet    Refill:  3    Dose decrease    Patient Instructions  Medication Instructions:  STOP spironolactone DECREASE rosuvastatin (Crestor) to 10 mg daily  *If you need a refill on your cardiac medications before your next appointment, please call your pharmacy*   Lab Work: Today (CMET, BNP)  If you have labs (blood work) drawn today and your tests are completely normal, you will receive your results only by: Marland Kitchen MyChart Message (if you have MyChart) OR . A paper copy in the mail If you have any lab test  that is abnormal or we need to change your treatment, we will call you to review the results.   Testing/Procedures: Your physician has recommended that you have a sleep study. This test records several body functions during sleep, including: brain activity, eye movement, oxygen and carbon dioxide blood levels, heart rate and rhythm, breathing rate and rhythm, the flow of air through your mouth and nose, snoring, body muscle movements, and chest and belly movement. --this must be authorized by insurance prior to scheduling  Follow-Up: At [facility], you and your health needs are our priority.  As part of our continuing mission to provide you with exceptional heart care, we have created designated Provider Care Teams.  These Care Teams include your primary Cardiologist (physician) and Advanced Practice Providers (APPs -  Physician Assistants and Nurse Practitioners) who all work together to provide you with the care you need, when you need it.  We recommend signing up for the patient portal called "MyChart".  Sign up information is provided on this After Visit Summary.  MyChart is used to connect with patients for Virtual Visits (Telemedicine).  Patients are able to view lab/test results, encounter notes, upcoming appointments, etc.  Non-urgent messages can be sent to your provider as well.   To learn more about what you can do with MyChart, go to [email or web address].    Your next appointment:   Tuesday 4/13 at 3:00pm with Dr. Gardiner Rhyme  Other Instructions You have been referred to pulmonology-their office will call to schedule appointment.      Signed, Donato Heinz, MD  11/18/2019 5:47 PM    [place]

## 2019-11-17 ENCOUNTER — Telehealth: Payer: Self-pay | Admitting: Cardiology

## 2019-11-17 NOTE — Telephone Encounter (Signed)
Patient calling to request his girlfriend Stanton Kidney come with him to his appointment tomorrow 11/17/2019, because he has memory issues.

## 2019-11-17 NOTE — Telephone Encounter (Signed)
Spoke to patient, aware ok to come to appt.  appt note updated

## 2019-11-18 ENCOUNTER — Ambulatory Visit (INDEPENDENT_AMBULATORY_CARE_PROVIDER_SITE_OTHER): Payer: Self-pay | Admitting: Cardiology

## 2019-11-18 ENCOUNTER — Encounter: Payer: Self-pay | Admitting: Cardiology

## 2019-11-18 ENCOUNTER — Other Ambulatory Visit: Payer: Self-pay

## 2019-11-18 VITALS — BP 124/74 | HR 80 | Temp 97.0°F | Ht 70.0 in | Wt 343.0 lb

## 2019-11-18 DIAGNOSIS — R351 Nocturia: Secondary | ICD-10-CM

## 2019-11-18 DIAGNOSIS — R0683 Snoring: Secondary | ICD-10-CM

## 2019-11-18 DIAGNOSIS — G4719 Other hypersomnia: Secondary | ICD-10-CM

## 2019-11-18 DIAGNOSIS — R0681 Apnea, not elsewhere classified: Secondary | ICD-10-CM

## 2019-11-18 DIAGNOSIS — Z79899 Other long term (current) drug therapy: Secondary | ICD-10-CM

## 2019-11-18 DIAGNOSIS — R0902 Hypoxemia: Secondary | ICD-10-CM

## 2019-11-18 DIAGNOSIS — I5032 Chronic diastolic (congestive) heart failure: Secondary | ICD-10-CM

## 2019-11-18 DIAGNOSIS — R0689 Other abnormalities of breathing: Secondary | ICD-10-CM

## 2019-11-18 DIAGNOSIS — N184 Chronic kidney disease, stage 4 (severe): Secondary | ICD-10-CM

## 2019-11-18 DIAGNOSIS — I10 Essential (primary) hypertension: Secondary | ICD-10-CM

## 2019-11-18 DIAGNOSIS — E785 Hyperlipidemia, unspecified: Secondary | ICD-10-CM

## 2019-11-18 MED ORDER — ROSUVASTATIN CALCIUM 10 MG PO TABS: 10.0000 mg | ORAL_TABLET | Freq: Every day | ORAL | 3 refills | Status: DC

## 2019-11-18 NOTE — Patient Instructions (Addendum)
Medication Instructions:  STOP spironolactone DECREASE rosuvastatin (Crestor) to 10 mg daily  *If you need a refill on your cardiac medications before your next appointment, please call your pharmacy*   Lab Work: Today (CMET, BNP)  If you have labs (blood work) drawn today and your tests are completely normal, you will receive your results only by: Marland Kitchen MyChart Message (if you have MyChart) OR . A paper copy in the mail If you have any lab test that is abnormal or we need to change your treatment, we will call you to review the results.   Testing/Procedures: Your physician has recommended that you have a sleep study. This test records several body functions during sleep, including: brain activity, eye movement, oxygen and carbon dioxide blood levels, heart rate and rhythm, breathing rate and rhythm, the flow of air through your mouth and nose, snoring, body muscle movements, and chest and belly movement. --this must be authorized by insurance prior to scheduling  Follow-Up: At [facility], you and your health needs are our priority.  As part of our continuing mission to provide you with exceptional heart care, we have created designated Provider Care Teams.  These Care Teams include your primary Cardiologist (physician) and Advanced Practice Providers (APPs -  Physician Assistants and Nurse Practitioners) who all work together to provide you with the care you need, when you need it.  We recommend signing up for the patient portal called "MyChart".  Sign up information is provided on this After Visit Summary.  MyChart is used to connect with patients for Virtual Visits (Telemedicine).  Patients are able to view lab/test results, encounter notes, upcoming appointments, etc.  Non-urgent messages can be sent to your provider as well.   To learn more about what you can do with MyChart, go to [email or web address].    Your next appointment:   Tuesday 4/13 at 3:00pm with Dr. Gardiner Rhyme  Other  Instructions You have been referred to pulmonology-their office will call to schedule appointment.

## 2019-11-19 ENCOUNTER — Other Ambulatory Visit: Payer: Self-pay | Admitting: *Deleted

## 2019-11-19 DIAGNOSIS — Z79899 Other long term (current) drug therapy: Secondary | ICD-10-CM

## 2019-11-19 DIAGNOSIS — E875 Hyperkalemia: Secondary | ICD-10-CM

## 2019-11-19 LAB — COMPREHENSIVE METABOLIC PANEL
ALT: 18 IU/L (ref 0–44)
AST: 19 IU/L (ref 0–40)
Albumin/Globulin Ratio: 1.1 — ABNORMAL LOW (ref 1.2–2.2)
Albumin: 4.1 g/dL (ref 3.8–4.9)
Alkaline Phosphatase: 43 IU/L (ref 39–117)
BUN/Creatinine Ratio: 21 — ABNORMAL HIGH (ref 9–20)
BUN: 55 mg/dL — ABNORMAL HIGH (ref 6–24)
Bilirubin Total: 0.3 mg/dL (ref 0.0–1.2)
CO2: 32 mmol/L — ABNORMAL HIGH (ref 20–29)
Calcium: 10.1 mg/dL (ref 8.7–10.2)
Chloride: 96 mmol/L (ref 96–106)
Creatinine, Ser: 2.56 mg/dL — ABNORMAL HIGH (ref 0.76–1.27)
GFR calc Af Amer: 30 mL/min/{1.73_m2} — ABNORMAL LOW (ref >59)
GFR calc non Af Amer: 26 mL/min/{1.73_m2} — ABNORMAL LOW (ref >59)
Globulin, Total: 3.9 g/dL (ref 1.5–4.5)
Glucose: 89 mg/dL (ref 65–99)
Potassium: 6 mmol/L (ref 3.5–5.2)
Sodium: 141 mmol/L (ref 134–144)
Total Protein: 8 g/dL (ref 6.0–8.5)

## 2019-11-19 LAB — BRAIN NATRIURETIC PEPTIDE: BNP: 17.4 pg/mL (ref 0.0–100.0)

## 2019-11-21 ENCOUNTER — Other Ambulatory Visit: Payer: Self-pay

## 2019-11-21 ENCOUNTER — Telehealth: Payer: Self-pay | Admitting: *Deleted

## 2019-11-21 DIAGNOSIS — E875 Hyperkalemia: Secondary | ICD-10-CM

## 2019-11-21 DIAGNOSIS — Z79899 Other long term (current) drug therapy: Secondary | ICD-10-CM

## 2019-11-21 NOTE — Telephone Encounter (Signed)
Left message to return a call to. ( sleep study appointment)

## 2019-11-22 LAB — BASIC METABOLIC PANEL
BUN/Creatinine Ratio: 26 — ABNORMAL HIGH (ref 9–20)
BUN: 65 mg/dL — ABNORMAL HIGH (ref 6–24)
CO2: 29 mmol/L (ref 20–29)
Calcium: 9.4 mg/dL (ref 8.7–10.2)
Chloride: 93 mmol/L — ABNORMAL LOW (ref 96–106)
Creatinine, Ser: 2.54 mg/dL — ABNORMAL HIGH (ref 0.76–1.27)
GFR calc Af Amer: 31 mL/min/{1.73_m2} — ABNORMAL LOW (ref >59)
GFR calc non Af Amer: 27 mL/min/{1.73_m2} — ABNORMAL LOW (ref >59)
Glucose: 85 mg/dL (ref 65–99)
Potassium: 5.5 mmol/L — ABNORMAL HIGH (ref 3.5–5.2)
Sodium: 139 mmol/L (ref 134–144)

## 2019-12-03 ENCOUNTER — Telehealth: Payer: Self-pay | Admitting: *Deleted

## 2019-12-03 NOTE — Telephone Encounter (Signed)
Called patient to notify him of sleep study appointment. He tells me he cannot go at this time. He does not have insurance. He will revisit this once he gets insurance.

## 2019-12-10 ENCOUNTER — Other Ambulatory Visit (HOSPITAL_COMMUNITY): Payer: Self-pay

## 2019-12-12 ENCOUNTER — Encounter (HOSPITAL_BASED_OUTPATIENT_CLINIC_OR_DEPARTMENT_OTHER): Payer: Self-pay | Admitting: Cardiovascular Disease

## 2019-12-15 NOTE — Progress Notes (Signed)
Cardiology Office Note:    Date:  12/16/2019   ID:  Aaron Hale, DOB 1960-11-05, MRN [id number]  PCP:  Patient, [name]  Cardiologist:  Donato Heinz, MD  Electrophysiologist:  None   Referring MD: No ref. provider found   Chief Complaint  Patient presents with  . Follow-up    1 month.  . Congestive Heart Failure    History of Present Illness:    Aaron Hale is a 59 y.o. male with a hx of HFpEF, hypertension, obesity, CKD stage IV who presents for follow-up.  TTE 05/04/2019 showed normal LV systolic function, moderate LVH, grade 1 diastolic dysfunction, normal RV function, no significant valvular disease.  He has been started on torsemide and has lost about 60 pounds over the last 6 months. He is on 2 L O2 all the time.  Denies any chest pain.  No smoking history.  Father died of MI in 1s.   At initial clinic visit on 11/18/2019, spironolactone was discontinued given his CKD stage IV.  BMP checked that day showed potassium 6.0.  His losartan was also discontinued.  Repeat BMP on 11/21/2019 showed improvement to potassium 5.5.  Subsequently had follow-up with nephrology, with labs on 3/25 showing improvement in creatinine to 2.1 and potassium 3.9.  Since last clinic visit, he reports that he has been feeling better.  Has lost 7 pounds.  Has been working on his diet, reducing portion size.  Brought BP log, has been 110s to 150s over 70s to 90s.  Dyspnea has improved.  Denies any chest pain.  Past Medical History:  Diagnosis Date  . CHF (congestive heart failure) ([place])   . Hypertension   . Morbid obesity ([place])     History reviewed. No pertinent surgical history.  Current Medications: Current Meds  Medication Sig  . aspirin 81 MG chewable tablet Chew by mouth daily.  . carvedilol (COREG) 6.25 MG tablet Take 12.5 mg by mouth 2 (two) times daily with a meal.   . rosuvastatin (CRESTOR) 10 MG tablet Take 1 tablet (10 mg total) by mouth daily.  Marland Kitchen torsemide (DEMADEX) 20 MG  tablet Take 20 mg by mouth daily.     Allergies:   Patient has no known allergies.   Social History   Socioeconomic History  . Marital status: Single    Spouse name: Not on file  . Number of children: Not on file  . Years of education: Not on file  . Highest education level: Not on file  Occupational History  . Occupation: retired  Tobacco Use  . Smoking status: Never Smoker  . Smokeless tobacco: Never Used  Substance and Sexual Activity  . Alcohol use: Not Currently  . Drug use: No  . Sexual activity: Not Currently  Other Topics Concern  . Not on file  Social History Narrative  . Not on file   Social Determinants of Health   Financial Resource Strain: Low Risk   . Difficulty of Paying Living Expenses: Not hard at all  Food Insecurity: Unknown  . Worried About Charity fundraiser in the Last Year: Patient refused  . Ran Out of Food in the Last Year: Patient refused  Transportation Needs: No Transportation Needs  . Lack of Transportation (Medical): No  . Lack of Transportation (Non-Medical): No  Physical Activity: Inactive  . Days of Exercise per Week: 0 days  . Minutes of Exercise per Session: 0 min  Stress: Stress Concern Present  . Feeling of Stress :  To some extent  Social Connections: Unknown  . Frequency of Communication with Friends and Family: Never  . Frequency of Social Gatherings with Friends and Family: Never  . Attends Religious Services: Never  . Active Member of Clubs or Organizations: No  . Attends Archivist Meetings: Never  . Marital Status: Not on file     Family History: The patient's family history includes CAD in his mother.  ROS:   Please see the history of present illness.     All other systems reviewed and are negative.  EKGs/Labs/Other Studies Reviewed:    The following studies were reviewed today:   EKG:  EKG is  ordered today.  The ekg ordered today demonstrates normal sinus rhythm, rate 80, poor R wave progression,  incomplete left bundle branch block  TTE 05/04/19: 1. The left ventricle has normal systolic function with an ejection  fraction of 60-65%. The cavity size was normal. There is moderately  increased left ventricular wall thickness. Left ventricular diastolic  Doppler parameters are consistent with impaired  relaxation. No evidence of left ventricular regional wall motion  abnormalities.  2. The right ventricle has normal systolic function. The cavity was  normal. There is no increase in right ventricular wall thickness.  3. The mitral valve is grossly normal.  4. The tricuspid valve is grossly normal.  5. The aortic valve is tricuspid. Aortic valve regurgitation was not  assessed by color flow Doppler.  6. The aorta is normal unless otherwise noted.   Recent Labs: 09/24/2019: Hemoglobin 10.4; Platelets 291 11/18/2019: ALT 18; BNP 17.4 11/21/2019: BUN 65; Creatinine, Ser 2.54; Potassium 5.5; Sodium 139  Recent Lipid Panel No results found for: CHOL, TRIG, HDL, CHOLHDL, VLDL, LDLCALC, LDLDIRECT  Physical Exam:    VS:  BP 124/70 (BP Location: Right Arm, Patient Position: Sitting, Cuff Size: Large)   Pulse 74   Temp 97.6 F (36.4 C)   Ht 5\' 10"  (1.778 m)   Wt (!) 336 lb (152.4 kg)   BMI 48.21 kg/m     Wt Readings from Last 3 Encounters:  12/16/19 (!) 336 lb (152.4 kg)  11/18/19 (!) 343 lb (155.6 kg)  09/23/19 (!) 354 lb (160.6 kg)     GEN: in no acute distress, on 2L [place] HEENT: Normal NECK: No JVD appreciated, but difficult to assess given body habitus LYMPHATICS: No lymphadenopathy CARDIAC: RRR, no murmurs, rubs, gallops RESPIRATORY: Bibasilar crackles ABDOMEN: Soft, non-tender, non-distended MUSCULOSKELETAL:  trace edema; No deformity  SKIN: Warm and dry NEUROLOGIC:  Alert and oriented x 3 PSYCHIATRIC:  Normal affect   ASSESSMENT:    1. Chronic diastolic heart failure ([place])   2. Chronic kidney disease (CKD), stage IV (severe) ([place])   3. Essential hypertension    4. Hyperlipidemia, unspecified hyperlipidemia type   5. Chronic respiratory insufficiency    PLAN:    Chronic respiratory insufficiency: On 2L O2.  Has been attributed to HFpEF, which may be contributing but had only mild diastolic dysfunction on most recent TTE.  Concern for underlying pulmonary disease.  Suspect OHS/OSA  -Referred to pulmonology for further evaluation -Sleep study ordered -Continue torsemide 20 mg daily.  He does not appear significantly volume overloaded on exam, but volume status difficult to assess given his body habitus.  Will check BMP, magnesium  Hypertension: On carvedilol 12.5 mg twice daily, torsemide 20 mg daily.  Spironolactone and losartan were discontinued due to hyperkalemia  CKD stage IV: Follows with Dr Hollie Salk in nephrology.  Most recent  labs showed improvement in creatinine to 2.1 on 11/27/2019  Hyperlipidemia: On rosuvastatin 40 mg daily, LDL 31.  Rosuvastatin dose decreased to 10 mg daily last month  RTC in 3 months   Medication Adjustments/Labs and Tests Ordered: Current medicines are reviewed at length with the patient today.  Concerns regarding medicines are outlined above.  Orders Placed This Encounter  Procedures  . Basic metabolic panel  . Magnesium   No orders of the defined types were placed in this encounter.   Patient Instructions  Medication Instructions:  Your physician recommends that you continue on your current medications as directed. Please refer to the Current Medication list given to you today.  *If you need a refill on your cardiac medications before your next appointment, please call your pharmacy*   Lab Work: Today (BMET, Engineer, materials)  If you have labs (blood work) drawn today and your tests are completely normal, you will receive your results only by: Marland Kitchen MyChart Message (if you have MyChart) OR . A paper copy in the mail If you have any lab test that is abnormal or we need to change your treatment, we will call you to  review the results.   Testing/Procedures: NONE  Follow-Up: At [facility], you and your health needs are our priority.  As part of our continuing mission to provide you with exceptional heart care, we have created designated Provider Care Teams.  These Care Teams include your primary Cardiologist (physician) and Advanced Practice Providers (APPs -  Physician Assistants and Nurse Practitioners) who all work together to provide you with the care you need, when you need it.  We recommend signing up for the patient portal called "MyChart".  Sign up information is provided on this After Visit Summary.  MyChart is used to connect with patients for Virtual Visits (Telemedicine).  Patients are able to view lab/test results, encounter notes, upcoming appointments, etc.  Non-urgent messages can be sent to your provider as well.   To learn more about what you can do with MyChart, go to [email or web address].    Your next appointment:   3 month(s)  The format for your next appointment:   In Person  Provider:   Oswaldo Milian, MD        Signed, Donato Heinz, MD  12/16/2019 9:48 PM    [place]

## 2019-12-16 ENCOUNTER — Ambulatory Visit (INDEPENDENT_AMBULATORY_CARE_PROVIDER_SITE_OTHER): Payer: Self-pay | Admitting: Cardiology

## 2019-12-16 ENCOUNTER — Encounter: Payer: Self-pay | Admitting: Cardiology

## 2019-12-16 ENCOUNTER — Other Ambulatory Visit: Payer: Self-pay

## 2019-12-16 VITALS — BP 124/70 | HR 74 | Temp 97.6°F | Ht 70.0 in | Wt 336.0 lb

## 2019-12-16 DIAGNOSIS — N184 Chronic kidney disease, stage 4 (severe): Secondary | ICD-10-CM

## 2019-12-16 DIAGNOSIS — I5032 Chronic diastolic (congestive) heart failure: Secondary | ICD-10-CM

## 2019-12-16 DIAGNOSIS — R0689 Other abnormalities of breathing: Secondary | ICD-10-CM

## 2019-12-16 DIAGNOSIS — I10 Essential (primary) hypertension: Secondary | ICD-10-CM

## 2019-12-16 DIAGNOSIS — E785 Hyperlipidemia, unspecified: Secondary | ICD-10-CM

## 2019-12-16 NOTE — Patient Instructions (Signed)
Medication Instructions:  Your physician recommends that you continue on your current medications as directed. Please refer to the Current Medication list given to you today.  *If you need a refill on your cardiac medications before your next appointment, please call your pharmacy*   Lab Work: Today (BMET, Engineer, materials)  If you have labs (blood work) drawn today and your tests are completely normal, you will receive your results only by: Marland Kitchen MyChart Message (if you have MyChart) OR . A paper copy in the mail If you have any lab test that is abnormal or we need to change your treatment, we will call you to review the results.   Testing/Procedures: NONE  Follow-Up: At [facility], you and your health needs are our priority.  As part of our continuing mission to provide you with exceptional heart care, we have created designated Provider Care Teams.  These Care Teams include your primary Cardiologist (physician) and Advanced Practice Providers (APPs -  Physician Assistants and Nurse Practitioners) who all work together to provide you with the care you need, when you need it.  We recommend signing up for the patient portal called "MyChart".  Sign up information is provided on this After Visit Summary.  MyChart is used to connect with patients for Virtual Visits (Telemedicine).  Patients are able to view lab/test results, encounter notes, upcoming appointments, etc.  Non-urgent messages can be sent to your provider as well.   To learn more about what you can do with MyChart, go to [email or web address].    Your next appointment:   3 month(s)  The format for your next appointment:   In Person  Provider:   Oswaldo Milian, MD

## 2019-12-17 LAB — BASIC METABOLIC PANEL
BUN/Creatinine Ratio: 23 — ABNORMAL HIGH (ref 9–20)
BUN: 45 mg/dL — ABNORMAL HIGH (ref 6–24)
CO2: 30 mmol/L — ABNORMAL HIGH (ref 20–29)
Calcium: 10.2 mg/dL (ref 8.7–10.2)
Chloride: 95 mmol/L — ABNORMAL LOW (ref 96–106)
Creatinine, Ser: 1.98 mg/dL — ABNORMAL HIGH (ref 0.76–1.27)
GFR calc Af Amer: 42 mL/min/{1.73_m2} — ABNORMAL LOW (ref >59)
GFR calc non Af Amer: 36 mL/min/{1.73_m2} — ABNORMAL LOW (ref >59)
Glucose: 86 mg/dL (ref 65–99)
Potassium: 4.2 mmol/L (ref 3.5–5.2)
Sodium: 142 mmol/L (ref 134–144)

## 2019-12-17 LAB — MAGNESIUM: Magnesium: 2.3 mg/dL (ref 1.6–2.3)

## 2020-01-27 ENCOUNTER — Encounter: Payer: Self-pay | Admitting: Pulmonary Disease

## 2020-01-27 ENCOUNTER — Other Ambulatory Visit: Payer: Self-pay

## 2020-01-27 ENCOUNTER — Ambulatory Visit (INDEPENDENT_AMBULATORY_CARE_PROVIDER_SITE_OTHER): Payer: Self-pay | Admitting: Pulmonary Disease

## 2020-01-27 VITALS — BP 130/72 | HR 71 | Temp 97.3°F | Ht 71.0 in | Wt 343.6 lb

## 2020-01-27 DIAGNOSIS — R0602 Shortness of breath: Secondary | ICD-10-CM

## 2020-01-27 NOTE — Patient Instructions (Addendum)
It is okay to stop oxygen during daytime as your oxygen levels are okay Continue using oxygen at night until we can get the sleep study done We will order split-night sleep study, PFTs and high-resolution CT scan  Follow-up in 1 to 2 months.

## 2020-01-27 NOTE — Progress Notes (Signed)
Aaron Hale    [id number]    May 06, 1961  Primary Care Physician:Rankins, Bill Salinas, MD  Referring Physician: Donato Heinz, MD [address] [place],  [place] [address]  Chief complaint: Consult for dyspnea, hypoxia  HPI: 59 year old with history of obesity, CHF, hypertension, CKD Evaluation for dyspnea, hypoxia. He had a hospitalization in 4315 for diastolic heart failure treated with diuresis.  He will start on supplemental oxygen at that time and continues on 2 L oxygen prescribed adequate diuresis and management of heart failure.  Hence he was sent [facility] for [facility] evaluation  Complains of mild dyspnea on exertion.  No cough, sputum production, fevers, chills  Pets: Has birds at home.  He had a cockatiel for many years.  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  No mold, hot tub, Jacuzzi.  No down pillows or comforter Smoking history: Never smoker Travel history: Originally from [place].  Moved to [place] in 2013 Relevant family history: No significant family history of lung disease.  Outpatient Encounter Medications as of 01/27/2020  Medication Sig  . aspirin 81 MG chewable tablet Chew by mouth daily.  . carvedilol (COREG) 6.25 MG tablet Take 12.5 mg by mouth 2 (two) times daily with a meal.   . rosuvastatin (CRESTOR) 10 MG tablet Take 1 tablet (10 mg total) by mouth daily.  Marland Kitchen torsemide (DEMADEX) 20 MG tablet Take 20 mg by mouth daily.   No facility-administered encounter medications on file as of 01/27/2020.    Allergies as of 01/27/2020  . (No Known Allergies)    Past Medical History:  Diagnosis Date  . CHF (congestive heart failure) ([place])   . Hypertension   . Morbid obesity ([place])     History reviewed. No pertinent surgical history.  Family History  Problem Relation Age of Onset  . CAD Mother     Social  History   Socioeconomic History  . Marital status: Single    Spouse name: Not on file  . Number of children: Not on file  . Years of education: Not on file  . Highest education level: Not on file  Occupational History  . Occupation: retired  Tobacco Use  . Smoking status: Never Smoker  . Smokeless tobacco: Never Used  Substance and Sexual Activity  . Alcohol use: Not Currently  . Drug use: No  . Sexual activity: Not Currently  Other Topics Concern  . Not on file  Social History Narrative  . Not on file   Social Determinants of Health   Financial Resource Strain: Low Risk   . Difficulty of Paying Living Expenses: Not hard at all  Food Insecurity: Unknown  . Worried About Charity fundraiser in the Last Year: Patient refused  . Ran Out of Food in the Last Year: Patient refused  Transportation Needs: No Transportation Needs  . Lack of Transportation (Medical): No  . Lack of Transportation (Non-Medical): No  Physical Activity: Inactive  . Days of Exercise per Week: 0 days  . Minutes of Exercise per Session: 0 min  Stress: Stress Concern Present  . Feeling of Stress : To some extent  Social Connections: Unknown  . Frequency of Communication with Friends and Family: Never  . Frequency of Social Gatherings with Friends and Family: Never  . Attends Religious Services: Never  . Active Member of Clubs or  Organizations: No  . Attends Archivist Meetings: Never  . Marital Status: Not on file  Intimate Partner Violence: Not At Risk  . Fear of Current or Ex-Partner: No  . Emotionally Abused: No  . Physically Abused: No  . Sexually Abused: No    Review of systems: Review of Systems  Constitutional: Negative for fever and chills.  HENT: Negative.   Eyes: Negative for blurred vision.  Respiratory: as per HPI  Cardiovascular: Negative for chest pain and palpitations.  Gastrointestinal: Negative for vomiting, diarrhea, blood per rectum. Genitourinary: Negative for  dysuria, urgency, frequency and hematuria.  Musculoskeletal: Negative for myalgias, back pain and joint pain.  Skin: Negative for itching and rash.  Neurological: Negative for dizziness, tremors, focal weakness, seizures and loss of consciousness.  Endo/Heme/Allergies: Negative for environmental allergies.  Psychiatric/Behavioral: Negative for depression, suicidal ideas and hallucinations.  All other systems reviewed and are negative.  Physical Exam: Blood pressure 130/72, pulse 71, temperature (!) 97.3 F (36.3 C), temperature source Oral, height 5\' 11"  (1.803 m), weight (!) 343 lb 9.6 oz (155.9 kg), SpO2 98 %. Gen:      No acute distress, obese HEENT:  EOMI, sclera anicteric Neck:     No masses; no thyromegaly Lungs:    Clear to auscultation bilaterally; normal respiratory effort CV:         Regular rate and rhythm; no murmurs Abd:      + bowel sounds; soft, non-tender; no palpable masses, no distension Ext:    1+ edema; adequate peripheral perfusion Skin:      Warm and dry; no rash Neuro: alert and oriented x 3 Psych: normal mood and affect  Data Reviewed: Imaging: Chest x-ray 05/03/2019-Cardiomegaly, mild pulmonary vascular congestion.  I have reviewed the images personally.  PFTs:  Labs: CBC 81/20/21-WBC 19.5, eos 2%, absolute eosinophil count 390  Assessment:  Hypoxic respiratory failure, dyspnea He has history of diastolic heart failure.  Still has mild lower extremity edema but diuresis limited by CKD. I am not sure if he has significant underlying lung disease but given history of bird exposure we will get high-resolution CT for better evaluation and schedule pulmonary function tests.  We walked him in the office today and his desats fell very briefly to 87% and recovered quickly.  He does not need supplemental oxygen during daytime.  I have told him to stop daytime oxygen but continue nocturnal oxygen until his sleep apnea can be evaluated  Suspected OSA Suspected  given body habitus, snoring.  Schedule split-night sleep study.  Plan/Recommendations: Stop supplemental oxygen during the daytime.  Continue nocturnal oxygen Split-night sleep study High-res CT, PFTs.  Marshell Garfinkel MD [facility] 01/27/2020, 2:23 PM  CC: Donato Heinz*

## 2020-02-03 ENCOUNTER — Ambulatory Visit (INDEPENDENT_AMBULATORY_CARE_PROVIDER_SITE_OTHER)
Admission: RE | Admit: 2020-02-03 | Discharge: 2020-02-03 | Disposition: A | Discharge status: Home / Self Care | Payer: 59 | Source: Ambulatory Visit | Attending: Pulmonary Disease | Admitting: Pulmonary Disease

## 2020-02-03 ENCOUNTER — Other Ambulatory Visit: Payer: Self-pay

## 2020-02-03 DIAGNOSIS — R0602 Shortness of breath: Secondary | ICD-10-CM

## 2020-02-13 ENCOUNTER — Telehealth: Payer: Self-pay | Admitting: Cardiology

## 2020-02-13 NOTE — Telephone Encounter (Signed)
I attempted to contact patient 02/13/20 to schedule follow up visit with Dr.Schumann from patients recall list. The patient didn't answer so I left message for patient to return call to get that appointment scheduled.

## 2020-03-10 ENCOUNTER — Other Ambulatory Visit (HOSPITAL_COMMUNITY)
Admission: RE | Admit: 2020-03-10 | Discharge: 2020-03-10 | Disposition: A | Discharge status: Home / Self Care | Payer: 59 | Source: Ambulatory Visit | Attending: Pulmonary Disease | Admitting: Pulmonary Disease

## 2020-03-10 DIAGNOSIS — Z01812 Encounter for preprocedural laboratory examination: Secondary | ICD-10-CM | POA: Diagnosis present

## 2020-03-10 DIAGNOSIS — Z20822 Contact with and (suspected) exposure to covid-19: Secondary | ICD-10-CM | POA: Diagnosis not present

## 2020-03-10 LAB — SARS CORONAVIRUS 2 (TAT 6-24 HRS): SARS Coronavirus 2: NEGATIVE

## 2020-03-12 ENCOUNTER — Ambulatory Visit (HOSPITAL_BASED_OUTPATIENT_CLINIC_OR_DEPARTMENT_OTHER): Payer: 59 | Admitting: Pulmonary Disease

## 2020-03-27 ENCOUNTER — Other Ambulatory Visit (HOSPITAL_COMMUNITY)
Admission: RE | Admit: 2020-03-27 | Discharge: 2020-03-27 | Disposition: A | Discharge status: Home / Self Care | Payer: 59 | Source: Ambulatory Visit | Attending: Pulmonary Disease | Admitting: Pulmonary Disease

## 2020-03-27 DIAGNOSIS — Z20822 Contact with and (suspected) exposure to covid-19: Secondary | ICD-10-CM | POA: Insufficient documentation

## 2020-03-27 DIAGNOSIS — Z01812 Encounter for preprocedural laboratory examination: Secondary | ICD-10-CM | POA: Insufficient documentation

## 2020-03-27 LAB — SARS CORONAVIRUS 2 (TAT 6-24 HRS): SARS Coronavirus 2: NEGATIVE

## 2020-03-30 ENCOUNTER — Other Ambulatory Visit: Payer: Self-pay

## 2020-03-30 ENCOUNTER — Ambulatory Visit (HOSPITAL_BASED_OUTPATIENT_CLINIC_OR_DEPARTMENT_OTHER): Payer: 59 | Attending: Pulmonary Disease | Admitting: Pulmonary Disease

## 2020-03-30 ENCOUNTER — Encounter (HOSPITAL_BASED_OUTPATIENT_CLINIC_OR_DEPARTMENT_OTHER): Payer: Self-pay | Admitting: Pulmonary Disease

## 2020-03-30 DIAGNOSIS — R0602 Shortness of breath: Secondary | ICD-10-CM | POA: Diagnosis not present

## 2020-03-30 DIAGNOSIS — R0683 Snoring: Secondary | ICD-10-CM

## 2020-03-30 HISTORY — DX: Shortness of breath: R06.02

## 2020-03-31 ENCOUNTER — Ambulatory Visit (INDEPENDENT_AMBULATORY_CARE_PROVIDER_SITE_OTHER): Payer: 59 | Admitting: Pulmonary Disease

## 2020-03-31 ENCOUNTER — Encounter: Payer: Self-pay | Admitting: Pulmonary Disease

## 2020-03-31 VITALS — BP 138/74 | HR 76 | Ht 70.0 in | Wt 343.0 lb

## 2020-03-31 DIAGNOSIS — R0602 Shortness of breath: Secondary | ICD-10-CM

## 2020-03-31 LAB — PULMONARY FUNCTION TEST
DL/VA % pred: 106 %
DL/VA: 4.55 ml/min/mmHg/L
DLCO cor % pred: 81 %
DLCO cor: 22.63 ml/min/mmHg
DLCO unc % pred: 81 %
DLCO unc: 22.63 ml/min/mmHg
FEF 25-75 Pre: 2.07 L/sec
FEF2575-%Pred-Pre: 68 %
FEV1-%Pred-Pre: 64 %
FEV1-Pre: 2.34 L
FEV1FVC-%Pred-Pre: 103 %
FEV6-%Pred-Pre: 65 %
FEV6-Pre: 2.99 L
FEV6FVC-%Pred-Pre: 104 %
FVC-%Pred-Pre: 62 %
FVC-Pre: 2.99 L
Pre FEV1/FVC ratio: 78 %
Pre FEV6/FVC Ratio: 100 %
RV % pred: 190 %
RV: 4.22 L
TLC % pred: 99 %
TLC: 6.97 L

## 2020-03-31 NOTE — Patient Instructions (Signed)
I am glad you are feeling better after the episode of dizziness today with pulmonary function test Your CT and lung tests show very minimal changes in the lung which are not concerning We will see what the results of your sleep study showed  I encourage you to get the Bazine vaccine Follow-up in 3 months.

## 2020-03-31 NOTE — Progress Notes (Signed)
Aaron Hale    [id number]    19-Feb-1961  Primary Care Physician:Rankins, Bill Salinas, MD  Referring Physician: Aretta Nip, [place],  [place] [address]  Chief complaint: Follow up for dyspnea, hypoxia  HPI: 59 year old with history of obesity, CHF, hypertension, CKD Evaluation for dyspnea, hypoxia. He had a hospitalization in 8786 for diastolic heart failure treated with diuresis.  He will start on supplemental oxygen at that time and continues on 2 L oxygen prescribed adequate diuresis and management of heart failure.  Hence he was sent to [facility] for further evaluation  Complains of mild dyspnea on exertion.  No cough, sputum production, fevers, chills  Pets: Has birds at home.  He had a cockatiel for many years.  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  No mold, hot tub, Jacuzzi.  No down pillows or comforter Smoking history: Never smoker Travel history: Originally from [place].  Moved to [place] in 2013 Relevant family history: No significant family history of lung disease.  Interim history: States that dyspnea is doing well.  No issues Had a split-night sleep study yesterday and results are awaited  Had an episode of dizziness while doing PFTs and test was stopped early He is now recovered and back to baseline.  Thinks that he got claustrophobic while in the plethysmography box  Outpatient Encounter Medications as of 03/31/2020  Medication Sig  . aspirin 81 MG chewable tablet Chew by mouth daily.  . carvedilol (COREG) 6.25 MG tablet Take 12.5 mg by mouth 2 (two) times daily with a meal.   . rosuvastatin (CRESTOR) 10 MG tablet Take 1 tablet (10 mg total) by mouth daily.  Marland Kitchen torsemide (DEMADEX) 20 MG tablet Take 20 mg by mouth daily.   No facility-administered encounter medications on file as of 03/31/2020.   Physical  Exam: Blood pressure 130/72, pulse 71, temperature (!) 97.3 F (36.3 C), temperature source Oral, height 5\' 11"  (1.803 m), weight (!) 343 lb 9.6 oz (155.9 kg), SpO2 98 %. Gen:      No acute distress, obese HEENT:  EOMI, sclera anicteric Neck:     No masses; no thyromegaly Lungs:    Clear to auscultation bilaterally; normal respiratory effort CV:         Regular rate and rhythm; no murmurs Abd:      + bowel sounds; soft, non-tender; no palpable masses, no distension Ext:    1+ edema; adequate peripheral perfusion Skin:      Warm and dry; no rash Neuro: alert and oriented x 3 Psych: normal mood and affect  Data Reviewed: Imaging: Chest x-ray 05/03/2019-Cardiomegaly, mild pulmonary vascular congestion.  High-res CT 02/03/2020-no evidence of interstitial lung disease, nonspecific scarring.  Bronchial wall thickening, dilated pulmonary artery, coronary atherosclerosis.   PFTs: 03/31/2020 FVC 2.99 [62%], FEV1 2.34 [54%], F/F 78, TLC 6.97 [99%], DLCO 22.63 [81%] Normal values.  Test stopped early due to dizziness  Labs: CBC 81/20/21-WBC 19.5, eos 2%, absolute eosinophil count 390  Assessment:  Hypoxic respiratory failure, dyspnea He has history of diastolic heart failure.  Still has mild lower extremity edema but diuresis limited by CKD. I am not sure if he has significant underlying lung disease but given history of bird exposure we had evaluated him High-res CT with no significant interstitial lung disease. PFTs reviewed with reduction in ERV secondary to obesity.  However there is no overt obstruction, restriction or diffusion impairment.  Suspected OSA Suspected given body habitus, snoring. Finished split-night sleep study yesterday.  Await results  Health maintenance We discussed COVID-19 vaccine.  He asked about risks of getting it.  I encouraged him to get it as he has significant underlying health issues and would not do well if he contracted  Covid.  Plan/Recommendations: Follow split-night sleep study  Marshell Garfinkel MD [facility] 03/31/2020, 2:54 PM  CC: Rankins, Bill Salinas, MD

## 2020-03-31 NOTE — Progress Notes (Signed)
Spirometry/P[place]th completed and (1) DLCO patient started experiencing severe dizziness and per Dr.Mannam test ended.

## 2020-04-03 DIAGNOSIS — R0602 Shortness of breath: Secondary | ICD-10-CM | POA: Diagnosis not present

## 2020-04-03 NOTE — Procedures (Signed)
    Patient Name: Aaron Hale, Aaron Hale Date: 03/30/2020 Gender: Male D.O.B: 13-Jan-1961 Age (years): 55 Referring Provider: Marshell Garfinkel Height (inches): 71 Interpreting Physician: Chesley Mires MD, ABSM Weight (lbs): 346 RPSGT: Laren Everts BMI: 48 MRN: [id number] Neck Size: 21.00  CLINICAL INFORMATION Sleep Study Type: NPSG  Indication for sleep study: Congestive Heart Failure, Fatigue, Hypertension, Morning Headaches, Obesity, Snoring  Epworth Sleepiness Score: 4  SLEEP STUDY TECHNIQUE As per the AASM Manual for the Scoring of Sleep and Associated Events v2.3 (April 2016) with a hypopnea requiring 4% desaturations.  The channels recorded and monitored were frontal, central and occipital EEG, electrooculogram (EOG), submentalis EMG (chin), nasal and oral airflow, thoracic and abdominal wall motion, anterior tibialis EMG, snore microphone, electrocardiogram, and pulse oximetry.  MEDICATIONS Medications self-administered by patient taken the night of the study : Arthur The study was initiated at 11:08:04 PM and ended at 5:18:55 AM.  Sleep onset time was 7.7 minutes and the sleep efficiency was 66.2%%. The total sleep time was 245.5 minutes.  Stage REM latency was 307.0 minutes.  The patient spent 12.6%% of the night in stage N1 sleep, 67.4%% in stage N2 sleep, 0.0%% in stage N3 and 20% in REM.  Alpha intrusion was absent.  Supine sleep was 100.00%. He slept in a recliner.  RESPIRATORY PARAMETERS The overall apnea/hypopnea index (AHI) was 63.3 per hour. There were 20 total apneas, including 17 obstructive, 2 central and 1 mixed apneas. There were 239 hypopneas and 47 RERAs.  The AHI during Stage REM sleep was 64.9 per hour.  AHI while supine was 63.3 per hour.  The mean oxygen saturation was 89.9%. The minimum SpO2 during sleep was 71.0%.  Supplemental oxygen not applied during this study.  Moderate snoring was noted during this  study.  CARDIAC DATA The 2 lead EKG demonstrated sinus rhythm. The mean heart rate was 60.4 beats per minute. Other EKG findings include: PVCs.  LEG MOVEMENT DATA The total PLMS were 0 with a resulting PLMS index of 0.0. Associated arousal with leg movement index was 0.0 .  IMPRESSIONS - Severe obstructive sleep apnea with an AHI of 63.3 and SpO2 low of 71%.   - He slept in a recliner during this study. - He did not meet split night study criteria due to insufficient sleep during the first portion of the study.   DIAGNOSIS - Obstructive Sleep Apnea (G47.33) - Nocturnal Hypoxemia (G47.36)  RECOMMENDATIONS - In addition to weight loss, he should be tried on CPAP therapy.  Options would be to arrange for in lab CPAP titration or alternative to arrange for auto CPAP range 5 to 20 cm H2O.  [Electronically signed] 04/03/2020 11:22 AM  Chesley Mires MD, ABSM Diplomate, American Board of Sleep Medicine   NPI: [id number]

## 2020-04-05 ENCOUNTER — Other Ambulatory Visit: Payer: Self-pay | Admitting: Cardiology

## 2020-04-05 NOTE — Telephone Encounter (Signed)
*  STAT* If patient is at the pharmacy, call can be transferred to refill team.   1. Which medications need to be refilled? (please list name of each medication and dose if known)? carvedilol (COREG) 6.25 MG tablet  2. Which pharmacy/location (including street and city if local pharmacy) is medication to be sent to? [facility] ([facility]), [place] - [address]  3. Do they need a 30 day or 90 day supply? 90 day supply

## 2020-04-06 MED ORDER — CARVEDILOL 6.25 MG PO TABS: 12.5000 mg | ORAL_TABLET | Freq: Two times a day (BID) | ORAL | 3 refills | Status: DC

## 2020-04-06 NOTE — Telephone Encounter (Signed)
Patient calling back for refill. He states he will be out of medication tomorrow.

## 2020-04-14 ENCOUNTER — Telehealth: Payer: Self-pay | Admitting: Pulmonary Disease

## 2020-04-14 DIAGNOSIS — G4733 Obstructive sleep apnea (adult) (pediatric): Secondary | ICD-10-CM

## 2020-04-14 NOTE — Telephone Encounter (Signed)
ATC Patient.  LM for Patient to call office for sleep results.   Marshell Garfinkel, MD  Elton Sin, LPN; P [facility] Triage Pool Sleep study shows severe sleep apnea with low oxygen levels Please arrange in lab titration study.

## 2020-04-15 NOTE — Telephone Encounter (Signed)
ATC Patient.  No answer, VM states memory is full. Will try again at a later time.

## 2020-04-15 NOTE — Telephone Encounter (Signed)
ATC Patient.  No answer, and VM full.  Titration order pended.  Will try to call again later today.

## 2020-04-16 ENCOUNTER — Encounter: Payer: Self-pay | Admitting: *Deleted

## 2020-04-16 NOTE — Telephone Encounter (Signed)
ATC Patient.  VM states memory is full.  Letter printed and mailed to Patient. Will leave message open until Patient has been contacted.

## 2020-04-19 NOTE — Telephone Encounter (Signed)
Called and spoke with pt letting him know the results of the HST and stated that we needed to have him do titration study. Pt verbalized understanding. Order placed for titration study. Nothing further needed.

## 2020-04-19 NOTE — Telephone Encounter (Signed)
Patient called back for results -pr

## 2020-05-05 NOTE — Progress Notes (Signed)
Cardiology Office Note:    Date:  05/06/2020   ID:  [name], DOB [birth year], MRN [id number]  PCP:  Aretta Nip, MD  Cardiologist:  Donato Heinz, MD  Electrophysiologist:  None   Referring MD: Aretta Nip, MD   Chief Complaint  Patient presents with  . Congestive Heart Failure    History of Present Illness:    Aaron Hale is a 59 y.o. male with a hx of HFpEF, hypertension, obesity, CKD stage IV who presents for follow-up.  TTE 05/04/2019 showed normal LV systolic function, moderate LVH, grade 1 diastolic dysfunction, normal RV function, no significant valvular disease.  He had been started on torsemide and lost about 60 pounds over the prior 6 months.   At initial clinic visit on 11/18/2019, spironolactone was discontinued given his CKD stage IV.  BMP checked that day showed potassium 6.0.  His losartan was also discontinued.  Repeat BMP on 11/21/2019 showed improvement to potassium 5.5.  Subsequently had follow-up with nephrology, with labs on 3/25 showing improvement in creatinine to 2.1 and potassium 3.9.  Since last clinic visit, he reports that he is doing well.  He has been taken off oxygen.  Reports dyspnea has improved.  BP has been 130s to 150s when he checks at home.  He denies any chest pain   Past Medical History:  Diagnosis Date  . CHF (congestive heart failure) ([place])   . Hypertension   . Morbid obesity ([place])   . Shortness of breath 03/30/2020    No past surgical history on file.  Current Medications: Current Meds  Medication Sig  . aspirin 81 MG chewable tablet Chew by mouth daily.  . carvedilol (COREG) 25 MG tablet Take 1 tablet (25 mg total) by mouth 2 (two) times daily with a meal.  . rosuvastatin (CRESTOR) 10 MG tablet Take 1 tablet (10 mg total) by mouth daily.  Marland Kitchen torsemide (DEMADEX) 20 MG tablet Take 1 tablet (20 mg total) by mouth daily.  . [DISCONTINUED] carvedilol (COREG) 6.25 MG tablet Take 2 tablets (12.5 mg total) by mouth 2  (two) times daily with a meal.  . [DISCONTINUED] torsemide (DEMADEX) 20 MG tablet Take 20 mg by mouth daily.     Allergies:   Patient has no known allergies.   Social History   Socioeconomic History  . Marital status: Single    Spouse name: Not on file  . Number of children: Not on file  . Years of education: Not on file  . Highest education level: Not on file  Occupational History  . Occupation: retired  Tobacco Use  . Smoking status: Never Smoker  . Smokeless tobacco: Never Used  Vaping Use  . Vaping Use: Never used  Substance and Sexual Activity  . Alcohol use: Not Currently  . Drug use: No  . Sexual activity: Not Currently  Other Topics Concern  . Not on file  Social History Narrative  . Not on file   Social Determinants of Health   Financial Resource Strain: Low Risk   . Difficulty of Paying Living Expenses: Not hard at all  Food Insecurity: Unknown  . Worried About Charity fundraiser in the Last Year: Patient refused  . Ran Out of Food in the Last Year: Patient refused  Transportation Needs: No Transportation Needs  . Lack of Transportation (Medical): No  . Lack of Transportation (Non-Medical): No  Physical Activity: Inactive  . Days of Exercise per Week: 0 days  . Minutes of  Exercise per Session: 0 min  Stress: Stress Concern Present  . Feeling of Stress : To some extent  Social Connections: Unknown  . Frequency of Communication with Friends and Family: Never  . Frequency of Social Gatherings with Friends and Family: Never  . Attends Religious Services: Never  . Active Member of Clubs or Organizations: No  . Attends Archivist Meetings: Never  . Marital Status: Not on file     Family History: The patient's family history includes CAD in his mother.  ROS:   Please see the history of present illness.     All other systems reviewed and are negative.  EKGs/Labs/Other Studies Reviewed:    The following studies were reviewed today:   EKG:   EKG is  ordered today.  The ekg ordered today demonstrates normal sinus rhythm, rate 73, left bundle branch block, PAC, motion artifact, first-degree AV block  TTE 05/04/19: 1. The left ventricle has normal systolic function with an ejection  fraction of 60-65%. The cavity size was normal. There is moderately  increased left ventricular wall thickness. Left ventricular diastolic  Doppler parameters are consistent with impaired  relaxation. No evidence of left ventricular regional wall motion  abnormalities.  2. The right ventricle has normal systolic function. The cavity was  normal. There is no increase in right ventricular wall thickness.  3. The mitral valve is grossly normal.  4. The tricuspid valve is grossly normal.  5. The aortic valve is tricuspid. Aortic valve regurgitation was not  assessed by color flow Doppler.  6. The aorta is normal unless otherwise noted.   Recent Labs: 09/24/2019: Hemoglobin 10.4; Platelets 291 11/18/2019: ALT 18; BNP 17.4 12/16/2019: BUN 45; Creatinine, Ser 1.98; Magnesium 2.3; Potassium 4.2; Sodium 142  Recent Lipid Panel No results found for: CHOL, TRIG, HDL, CHOLHDL, VLDL, LDLCALC, LDLDIRECT  Physical Exam:    VS:  BP 132/84   Pulse 72   Ht 5\' 11"  (1.803 m)   Wt (!) 356 lb (161.5 kg)   SpO2 92%   BMI 49.65 kg/m     Wt Readings from Last 3 Encounters:  05/06/20 (!) 356 lb (161.5 kg)  03/31/20 (!) 343 lb (155.6 kg)  03/30/20 (!) 346 lb (156.9 kg)     GEN: in no acute distress, on 2L [place] HEENT: Normal NECK: No JVD appreciated, but difficult to assess given body habitus LYMPHATICS: No lymphadenopathy CARDIAC: RRR, no murmurs, rubs, gallops RESPIRATORY: Bibasilar crackles ABDOMEN: Soft, non-tender, non-distended MUSCULOSKELETAL:  trace edema; No deformity  SKIN: Warm and dry NEUROLOGIC:  Alert and oriented x 3 PSYCHIATRIC:  Normal affect   ASSESSMENT:    1. Chronic diastolic heart failure ([place])   2. Essential hypertension   3.  Chronic respiratory insufficiency   4. Hyperlipidemia, unspecified hyperlipidemia type   5. OSA (obstructive sleep apnea)    PLAN:    Chronic respiratory insufficiency: had been on 2L O2 chronically, but now is off oxygen.  Has been attributed to HFpEF, which may be contributing but had only mild diastolic dysfunction on most recent TTE.  Concern for underlying pulmonary disease.  Seen by pulmonology: CT chest unremarkable, PFTs unremarkable.  Suspect OHS/OSA.  Recently underwent sleep study, showed severe OSA -Continue torsemide 20 mg daily.  He does not appear significantly volume overloaded on exam, but volume status difficult to assess given his body habitus.  Had labs drawn yesterday with nephrologist, will follow up results  Hypertension: On carvedilol 12.5 mg twice daily, torsemide 20 mg  daily.  Spironolactone and losartan were discontinued due to hyperkalemia.  BP has been above goal, will increase carvedilol to 25 mg twice daily  CKD stage IV: Follows with Dr Hollie Salk in nephrology.  Most recent labs showed improvement in creatinine to 2.1 on 11/27/2019.  Had labs done yesterday, will follow up results.  Hyperlipidemia: On rosuvastatin 40 mg daily, LDL 31.  Rosuvastatin dose decreased to 10 mg daily last month  OSA: Recently diagnosed, starting on CPAP  RTC in 3 months   Medication Adjustments/Labs and Tests Ordered: Current medicines are reviewed at length with the patient today.  Concerns regarding medicines are outlined above.  Orders Placed This Encounter  Procedures  . EKG 12-Lead   Meds ordered this encounter  Medications  . torsemide (DEMADEX) 20 MG tablet    Sig: Take 1 tablet (20 mg total) by mouth daily.    Dispense:  90 tablet    Refill:  3  . carvedilol (COREG) 25 MG tablet    Sig: Take 1 tablet (25 mg total) by mouth 2 (two) times daily with a meal.    Dispense:  180 tablet    Refill:  3    Patient Instructions  Medication Instructions:  INCREASE carvedilol  (Coreg) to 25 mg two times daily  *If you need a refill on your cardiac medications before your next appointment, please call your pharmacy*  Follow-Up: At [facility], you and your health needs are our priority.  As part of our continuing mission to provide you with exceptional heart care, we have created designated Provider Care Teams.  These Care Teams include your primary Cardiologist (physician) and Advanced Practice Providers (APPs -  Physician Assistants and Nurse Practitioners) who all work together to provide you with the care you need, when you need it.  We recommend signing up for the patient portal called "MyChart".  Sign up information is provided on this After Visit Summary.  MyChart is used to connect with patients for Virtual Visits (Telemedicine).  Patients are able to view lab/test results, encounter notes, upcoming appointments, etc.  Non-urgent messages can be sent to your provider as well.   To learn more about what you can do with MyChart, go to [email or web address].    Your next appointment:   3 month(s) with PA or NP 6 months with Dr. Gardiner Rhyme  Other Instructions Please check your blood pressure at home daily, write it down.  Call the office or send message via Mychart with the readings in 2 weeks for Dr. Gardiner Rhyme to review.      Signed, Donato Heinz, MD  05/06/2020 1:19 PM    [facility]

## 2020-05-06 ENCOUNTER — Other Ambulatory Visit: Payer: Self-pay

## 2020-05-06 ENCOUNTER — Encounter: Payer: Self-pay | Admitting: Cardiology

## 2020-05-06 ENCOUNTER — Ambulatory Visit (INDEPENDENT_AMBULATORY_CARE_PROVIDER_SITE_OTHER): Payer: 59 | Admitting: Cardiology

## 2020-05-06 VITALS — BP 132/84 | HR 72 | Ht 71.0 in | Wt 356.0 lb

## 2020-05-06 DIAGNOSIS — G4733 Obstructive sleep apnea (adult) (pediatric): Secondary | ICD-10-CM

## 2020-05-06 DIAGNOSIS — R0689 Other abnormalities of breathing: Secondary | ICD-10-CM | POA: Diagnosis not present

## 2020-05-06 DIAGNOSIS — E785 Hyperlipidemia, unspecified: Secondary | ICD-10-CM | POA: Diagnosis not present

## 2020-05-06 DIAGNOSIS — I10 Essential (primary) hypertension: Secondary | ICD-10-CM | POA: Diagnosis not present

## 2020-05-06 DIAGNOSIS — I5032 Chronic diastolic (congestive) heart failure: Secondary | ICD-10-CM

## 2020-05-06 MED ORDER — TORSEMIDE 20 MG PO TABS: 20.0000 mg | ORAL_TABLET | Freq: Every day | ORAL | 3 refills | Status: DC

## 2020-05-06 MED ORDER — CARVEDILOL 25 MG PO TABS: 25.0000 mg | ORAL_TABLET | Freq: Two times a day (BID) | ORAL | 3 refills | Status: DC

## 2020-05-06 NOTE — Patient Instructions (Signed)
Medication Instructions:  INCREASE carvedilol (Coreg) to 25 mg two times daily  *If you need a refill on your cardiac medications before your next appointment, please call your pharmacy*  Follow-Up: At [facility], you and your health needs are our priority.  As part of our continuing mission to provide you with exceptional heart care, we have created designated Provider Care Teams.  These Care Teams include your primary Cardiologist (physician) and Advanced Practice Providers (APPs -  Physician Assistants and Nurse Practitioners) who all work together to provide you with the care you need, when you need it.  We recommend signing up for the patient portal called "MyChart".  Sign up information is provided on this After Visit Summary.  MyChart is used to connect with patients for Virtual Visits (Telemedicine).  Patients are able to view lab/test results, encounter notes, upcoming appointments, etc.  Non-urgent messages can be sent to your provider as well.   To learn more about what you can do with MyChart, go to [email or web address].    Your next appointment:   3 month(s) with PA or NP 6 months with Dr. Gardiner Rhyme  Other Instructions Please check your blood pressure at home daily, write it down.  Call the office or send message via Mychart with the readings in 2 weeks for Dr. Gardiner Rhyme to review.

## 2020-05-25 ENCOUNTER — Other Ambulatory Visit: Payer: Self-pay

## 2020-05-25 ENCOUNTER — Ambulatory Visit (HOSPITAL_BASED_OUTPATIENT_CLINIC_OR_DEPARTMENT_OTHER): Payer: 59 | Attending: Pulmonary Disease | Admitting: Pulmonary Disease

## 2020-05-25 DIAGNOSIS — G4733 Obstructive sleep apnea (adult) (pediatric): Secondary | ICD-10-CM | POA: Diagnosis not present

## 2020-05-27 DIAGNOSIS — G4733 Obstructive sleep apnea (adult) (pediatric): Secondary | ICD-10-CM

## 2020-05-27 NOTE — Procedures (Signed)
    Patient Name: Aaron, Hale Date: 05/25/2020 Gender: Male D.O.B: 10-18-1960 Age (years): 45 Referring Provider: Marshell Garfinkel Height (inches): 71 Interpreting Physician: Chesley Mires MD, ABSM Weight (lbs): 358 RPSGT: Carolin Coy BMI: 50 MRN: [id number] Neck Size: 20.50  CLINICAL INFORMATION The patient is referred for a BiPAP titration to treat sleep apnea.  Date of NPSG 03/30/20: AHI 63.3, SpO2 low 71%.  SLEEP STUDY TECHNIQUE As per the AASM Manual for the Scoring of Sleep and Associated Events v2.3 (April 2016) with a hypopnea requiring 4% desaturations.  The channels recorded and monitored were frontal, central and occipital EEG, electrooculogram (EOG), submentalis EMG (chin), nasal and oral airflow, thoracic and abdominal wall motion, anterior tibialis EMG, snore microphone, electrocardiogram, and pulse oximetry. Bilevel positive airway pressure (BPAP) was initiated at the beginning of the study and titrated to treat sleep-disordered breathing.  MEDICATIONS Medications self-administered by patient taken the night of the study : N/A  RESPIRATORY PARAMETERS Optimal IPAP Pressure (cm): 22 AHI at Optimal Pressure (/hr) 12.5 Optimal EPAP Pressure (cm): 18   Overall Minimal O2 (%): 79.0 Minimal O2 at Optimal Pressure (%): 89.0  SLEEP ARCHITECTURE Start Time: 10:20:49 PM Stop Time: 4:45:19 AM Total Time (min): 384.5 Total Sleep Time (min): 256 Sleep Latency (min): 10.9 Sleep Efficiency (%): 66.6% REM Latency (min): 123.0 WASO (min): 117.6 Stage N1 (%): 12.7% Stage N2 (%): 61.3% Stage N3 (%): 0.0% Stage R (%): 26 Supine (%): 0.00 Arousal Index (/hr): 30.2   CARDIAC DATA The 2 lead EKG demonstrated sinus rhythm. The mean heart rate was 54.7 beats per minute. Other EKG findings include: PVCs.  LEG MOVEMENT DATA The total Periodic Limb Movements of Sleep (PLMS) were 0. The PLMS index was 0.0. A PLMS index of <15 is considered normal in adults.  IMPRESSIONS - He  was tried on CPAP up to 20 cm H2O.  He continued to have obstructive respiratory events, and had difficulty tolerating CPAP at this high a setting. - He was transitioned to Bipap and optimal setting was Bipap 22/18 cm H2O. - He did not require use of supplemental oxygen during this study.  DIAGNOSIS - Obstructive Sleep Apnea (G47.33)  RECOMMENDATIONS - Trial of BiPAP therapy on 22/18 cm H2O with a Small size Resmed Full Face Mask AirFit F20 mask and heated humidification.  [Electronically signed] 05/27/2020 03:02 PM  Chesley Mires MD, [place], American Board of Sleep Medicine   NPI: [id number]

## 2020-05-28 NOTE — Addendum Note (Signed)
Addended by: Elton Sin on: 05/28/2020 04:25 PM   Modules accepted: Orders

## 2020-05-28 NOTE — Progress Notes (Signed)
Called and spoke with Patient.  Dr Matilde Bash results and recommendations given.  Understanding stated.  DME order placed. Nothing further at this time.

## 2020-07-04 ENCOUNTER — Encounter: Payer: Self-pay | Admitting: Pulmonary Disease

## 2020-07-04 NOTE — Progress Notes (Signed)
Can you make sure that he has already been started on BiPAP This is ordered on 9/24  He will need follow-up with one of the sleep doctors as he has severe sleep apnea.

## 2020-07-05 ENCOUNTER — Telehealth: Payer: Self-pay | Admitting: Pulmonary Disease

## 2020-07-05 NOTE — Telephone Encounter (Signed)
Per Dr. Vaughan Browner 07/04/20-   Can you make sure that he has already been started on BiPAP This is ordered on 9/24  He will need follow-up with one of the sleep doctors as he has severe sleep apnea.  ATC Patient.  LM to call back 07/06/20.

## 2020-07-08 ENCOUNTER — Encounter: Payer: Self-pay | Admitting: *Deleted

## 2020-07-08 NOTE — Telephone Encounter (Signed)
ATC Patient x's 2.  Only listed number for Patient is a busy signal. Will send letter for Patient to contact office. Message closed.

## 2020-07-09 ENCOUNTER — Other Ambulatory Visit: Payer: Self-pay | Admitting: Cardiovascular Disease

## 2020-07-16 ENCOUNTER — Telehealth: Payer: Self-pay | Admitting: Pulmonary Disease

## 2020-07-16 NOTE — Telephone Encounter (Signed)
Pt aware order did not make it to our box but that I will send to [employer] and he is aware of that

## 2020-08-11 ENCOUNTER — Other Ambulatory Visit: Payer: Self-pay

## 2020-08-11 ENCOUNTER — Encounter: Payer: Self-pay | Admitting: Cardiology

## 2020-08-11 ENCOUNTER — Ambulatory Visit (INDEPENDENT_AMBULATORY_CARE_PROVIDER_SITE_OTHER): Payer: 59 | Admitting: Cardiology

## 2020-08-11 DIAGNOSIS — G4733 Obstructive sleep apnea (adult) (pediatric): Secondary | ICD-10-CM | POA: Diagnosis not present

## 2020-08-11 DIAGNOSIS — I10 Essential (primary) hypertension: Secondary | ICD-10-CM

## 2020-08-11 DIAGNOSIS — I5032 Chronic diastolic (congestive) heart failure: Secondary | ICD-10-CM | POA: Diagnosis not present

## 2020-08-11 DIAGNOSIS — N184 Chronic kidney disease, stage 4 (severe): Secondary | ICD-10-CM

## 2020-08-11 DIAGNOSIS — G4730 Sleep apnea, unspecified: Secondary | ICD-10-CM | POA: Insufficient documentation

## 2020-08-11 MED ORDER — AMLODIPINE BESYLATE 5 MG PO TABS: 5.0000 mg | ORAL_TABLET | Freq: Every day | ORAL | 3 refills | Status: DC

## 2020-08-11 MED ORDER — CARVEDILOL 12.5 MG PO TABS: 12.5000 mg | ORAL_TABLET | Freq: Two times a day (BID) | ORAL | 3 refills | Status: DC

## 2020-08-11 NOTE — Assessment & Plan Note (Signed)
Hyperkalemia with Aldactone and Losartan.  B/P by me 132/72 with large cuff. I suggested we decrease his Coreg to 12.5 mg BID and ad Norvasc 5 mg daily.

## 2020-08-11 NOTE — Assessment & Plan Note (Signed)
Followed by Dr Hollie Salk.  I encouraged him to contact Dr Bishop [place] office and let them know about the increased Demadex dose

## 2020-08-11 NOTE — Assessment & Plan Note (Addendum)
C/O increased weight despite increasing his Demadex to 20 mg BID C/O fatigue which he attributes to increased Coreg dose

## 2020-08-11 NOTE — Progress Notes (Signed)
Cardiology Office Note:    Date:  08/11/2020   ID:  Aaron Hale, DOB 1961/03/11, MRN [id number]  PCP:  Aretta Nip, MD  Cardiologist:  Donato Heinz, MD  Electrophysiologist:  None   Referring MD: Aretta Nip, MD   CC: fatigue  History of Present Illness:    Aaron Hale is a 59 y.o. male with a hx of chronic diastolic CHF, morbid obesity with a BMI of 6, hypertension, and chronic renal insufficiency.  He has had problems in the past with hyperkalemia with Aldactone and losartan.  Coreg was increased in the past to help control his pressure.  He had been on amlodipine at one point, I believe this was stopped for lower extremity edema, he was on a 10 mg dose then.  Patient is in the office today for routine follow-up.  Since we saw him last he is had no new complaints.  He does say he is fatigued and he attributes this to the increase carvedilol.  He has been diagnosed with sleep apnea and is awaiting BiPAP, this is on back order.  He is using oxygen at night.  His weight is drifted up and he increased his torsemide to twice a day on his own.  He admits he is not following a low-sodium diet, he is eating pizza and fast foods.  His lower extremity edema is stable.  Blood pressure by me in the office was 132/80 in his left arm with a large cuff.  He says his blood pressure at home runs in the 130-140 range.  Past Medical History:  Diagnosis Date  . CHF (congestive heart failure) ([place])   . Hypertension   . Morbid obesity ([place])   . Shortness of breath 03/30/2020    No past surgical history on file.  Current Medications: Current Meds  Medication Sig  . aspirin 81 MG chewable tablet Chew by mouth daily.  . carvedilol (COREG) 12.5 MG tablet Take 1 tablet (12.5 mg total) by mouth 2 (two) times daily with a meal.  . rosuvastatin (CRESTOR) 10 MG tablet Take 1 tablet (10 mg total) by mouth daily.  Marland Kitchen torsemide (DEMADEX) 20 MG tablet Take 1 tablet (20 mg total) by mouth  daily.  . [DISCONTINUED] carvedilol (COREG) 25 MG tablet Take 1 tablet (25 mg total) by mouth 2 (two) times daily with a meal.     Allergies:   Spironolactone   Social History   Socioeconomic History  . Marital status: Single    Spouse name: Not on file  . Number of children: Not on file  . Years of education: Not on file  . Highest education level: Not on file  Occupational History  . Occupation: retired  Tobacco Use  . Smoking status: Never Smoker  . Smokeless tobacco: Never Used  Vaping Use  . Vaping Use: Never used  Substance and Sexual Activity  . Alcohol use: Not Currently  . Drug use: No  . Sexual activity: Not Currently  Other Topics Concern  . Not on file  Social History Narrative  . Not on file   Social Determinants of Health   Financial Resource Strain:   . Difficulty of Paying Living Expenses: Not on file  Food Insecurity:   . Worried About Charity fundraiser in the Last Year: Not on file  . Ran Out of Food in the Last Year: Not on file  Transportation Needs:   . Lack of Transportation (Medical): Not on file  . Lack  of Transportation (Non-Medical): Not on file  Physical Activity:   . Days of Exercise per Week: Not on file  . Minutes of Exercise per Session: Not on file  Stress:   . Feeling of Stress : Not on file  Social Connections:   . Frequency of Communication with Friends and Family: Not on file  . Frequency of Social Gatherings with Friends and Family: Not on file  . Attends Religious Services: Not on file  . Active Member of Clubs or Organizations: Not on file  . Attends Archivist Meetings: Not on file  . Marital Status: Not on file     Family History: The patient's family history includes CAD in his mother.  ROS:   Please see the history of present illness.     All other systems reviewed and are negative.  EKGs/Labs/Other Studies Reviewed:    The following studies were reviewed today:  Echo 05/03/2020- IMPRESSIONS     1. The left ventricle has normal systolic function with an ejection  fraction of 60-65%. The cavity size was normal. There is moderately  increased left ventricular wall thickness. Left ventricular diastolic  Doppler parameters are consistent with impaired  relaxation. No evidence of left ventricular regional wall motion  abnormalities.  2. The right ventricle has normal systolic function. The cavity was  normal. There is no increase in right ventricular wall thickness.  3. The mitral valve is grossly normal.  4. The tricuspid valve is grossly normal.  5. The aortic valve is tricuspid. Aortic valve regurgitation was not  assessed by color flow Doppler.  6. The aorta is normal unless otherwise noted.   EKG:  EKG is not ordered today.  The ekg ordered 05/06/2020 demonstrates NSR, 68, poor tracing but appears to be NSR, with LAD and LBBB  Recent Labs: 09/24/2019: Hemoglobin 10.4; Platelets 291 11/18/2019: ALT 18; BNP 17.4 12/16/2019: BUN 45; Creatinine, Ser 1.98; Magnesium 2.3; Potassium 4.2; Sodium 142  Recent Lipid Panel No results found for: CHOL, TRIG, HDL, CHOLHDL, VLDL, LDLCALC, LDLDIRECT  Physical Exam:    VS:  BP (!) 162/80   Pulse 69   Ht 5\' 11"  (1.803 m)   Wt (!) 379 lb 6.4 oz (172.1 kg)   SpO2 93%   BMI 52.92 kg/m     Wt Readings from Last 3 Encounters:  08/11/20 (!) 379 lb 6.4 oz (172.1 kg)  05/25/20 (!) 358 lb (162.4 kg)  05/06/20 (!) 356 lb (161.5 kg)     GEN: Morbidly obese Caucasian male,  in no acute distress HEENT: Normal NECK: No JVD; No carotid bruits CARDIAC: RRR, no murmurs, rubs, gallops RESPIRATORY:  Clear to auscultation without rales, wheezing or rhonchi  ABDOMEN: Obese, soft, non-distended MUSCULOSKELETAL:  No edema; No deformity  SKIN: Warm and dry NEUROLOGIC:  Alert and oriented x 3 PSYCHIATRIC:  Normal affect   ASSESSMENT:    Chronic diastolic (congestive) heart failure (HCC) C/O increased weight despite increasing his Demadex  to 20 mg BID C/O fatigue which he attributes to increased Coreg dose  CRI (chronic renal insufficiency), stage 4 (severe) (HCC) Followed by Dr Hollie Salk.  I encouraged him to contact Dr Bishop [place] office and let them know about the increased Demadex dose  Essential hypertension Hyperkalemia with Aldactone and Losartan.  B/P by me 132/72 with large cuff. I suggested we decrease his Coreg to 12.5 mg BID and ad Norvasc 5 mg daily.   Morbid obesity (HCC) BMI 49. His weight is up today.  He admits  he has been eating pizza and fast foods  Sleep apnea bi-pap prescribed Aug 2021- he is still waiting to receive this.  He is on O2 at night  PLAN:    Decrease Coreg to 12.5 mg BID secondary to fatigue.  Add Amlodipine 5 mg to try and keep systolic B/P < 545. We discussed low sodium diet and I provided him dietary guidelines.  F/U with Dr Johny Chess diuretics and lab work.  Start Bi-pap when available.  F/U office visit 3 months.    Medication Adjustments/Labs and Tests Ordered: Current medicines are reviewed at length with the patient today.  Concerns regarding medicines are outlined above.  No orders of the defined types were placed in this encounter.  Meds ordered this encounter  Medications  . carvedilol (COREG) 12.5 MG tablet    Sig: Take 1 tablet (12.5 mg total) by mouth 2 (two) times daily with a meal.    Dispense:  180 tablet    Refill:  3  . amLODipine (NORVASC) 5 MG tablet    Sig: Take 1 tablet (5 mg total) by mouth daily.    Dispense:  90 tablet    Refill:  3    Patient Instructions  Medication Instructions:  DECREASE- Carvedilol 12.5 mg by mouth twice a day START- Amlodipine 5 mg by mouth daily  *If you need a refill on your cardiac medications before your next appointment, please call your pharmacy*   Lab Work: None Ordered   Testing/Procedures: None Ordered   Follow-Up: At [facility], you and your health needs are our priority.  As part of our continuing mission to  provide you with exceptional heart care, we have created designated Provider Care Teams.  These Care Teams include your primary Cardiologist (physician) and Advanced Practice Providers (APPs -  Physician Assistants and Nurse Practitioners) who all work together to provide you with the care you need, when you need it.  We recommend signing up for the patient portal called "MyChart".  Sign up information is provided on this After Visit Summary.  MyChart is used to connect with patients for Virtual Visits (Telemedicine).  Patients are able to view lab/test results, encounter notes, upcoming appointments, etc.  Non-urgent messages can be sent to your provider as well.   To learn more about what you can do with MyChart, go to [email or web address].    Your next appointment:   3 month(s)  The format for your next appointment:   In Person  Provider:   You may see Donato Heinz, MD or one of the following Advanced Practice Providers on your designated Care Team:    Rosaria Ferries, PA-C  Jory Sims, DNP, ANP        Signed, Kerin Ransom, PA-C  08/11/2020 2:05 PM    [place]

## 2020-08-11 NOTE — Assessment & Plan Note (Signed)
BMI 49. His weight is up today.  He admits he has been eating pizza and fast foods

## 2020-08-11 NOTE — Patient Instructions (Signed)
Medication Instructions:  DECREASE- Carvedilol 12.5 mg by mouth twice a day START- Amlodipine 5 mg by mouth daily  *If you need a refill on your cardiac medications before your next appointment, please call your pharmacy*   Lab Work: None Ordered   Testing/Procedures: None Ordered   Follow-Up: At [facility], you and your health needs are our priority.  As part of our continuing mission to provide you with exceptional heart care, we have created designated Provider Care Teams.  These Care Teams include your primary Cardiologist (physician) and Advanced Practice Providers (APPs -  Physician Assistants and Nurse Practitioners) who all work together to provide you with the care you need, when you need it.  We recommend signing up for the patient portal called "MyChart".  Sign up information is provided on this After Visit Summary.  MyChart is used to connect with patients for Virtual Visits (Telemedicine).  Patients are able to view lab/test results, encounter notes, upcoming appointments, etc.  Non-urgent messages can be sent to your provider as well.   To learn more about what you can do with MyChart, go to [email or web address].    Your next appointment:   3 month(s)  The format for your next appointment:   In Person  Provider:   You may see Donato Heinz, MD or one of the following Advanced Practice Providers on your designated Care Team:    Rosaria Ferries, PA-C  Jory Sims, DNP, ANP

## 2020-08-11 NOTE — Assessment & Plan Note (Signed)
bi-pap prescribed Aug 2021- he is still waiting to receive this.  He is on O2 at night

## 2020-09-09 IMAGING — US VENOUS DOPPLER ULTRASOUND OF  LOWER EXTREMITIES
1 series · 13 of 24 positions shown · non-contrast
Comparison: None.

CLINICAL DATA: 57-year-old male with bilateral lower extremity
swelling and redness.



[Series 1: venous doppler ultrasound of lower extremities · 13 of 69 slices shown]
[im 1/69]
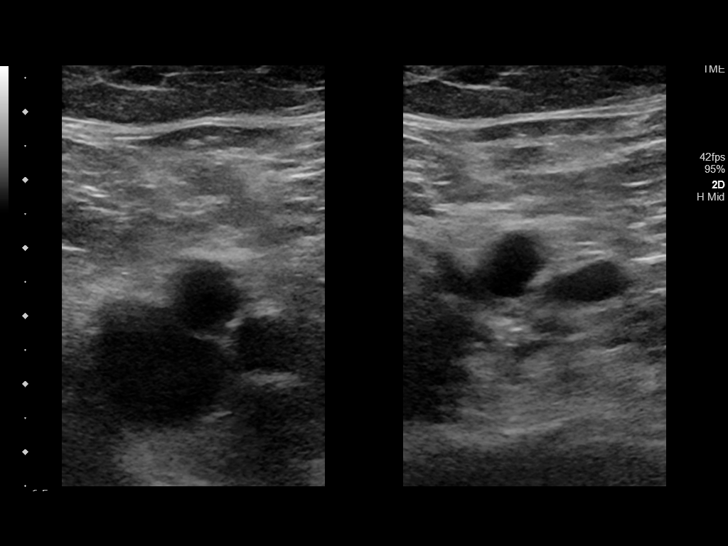
[im 6/69]
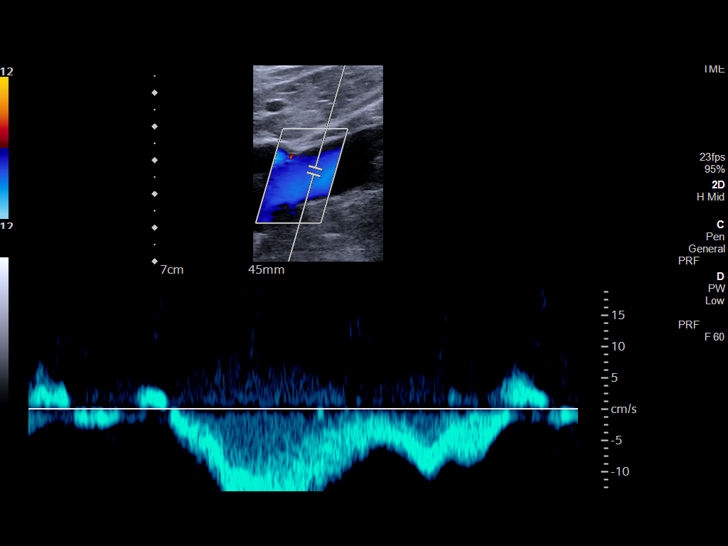
[im 12/69]
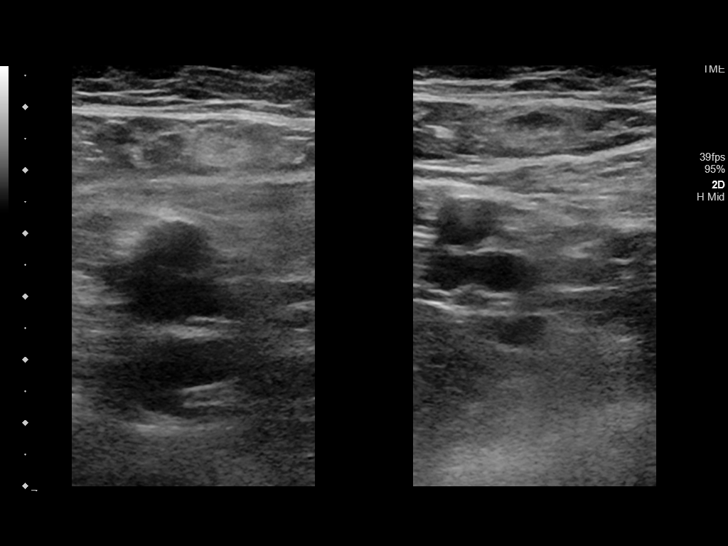
[im 18/69]
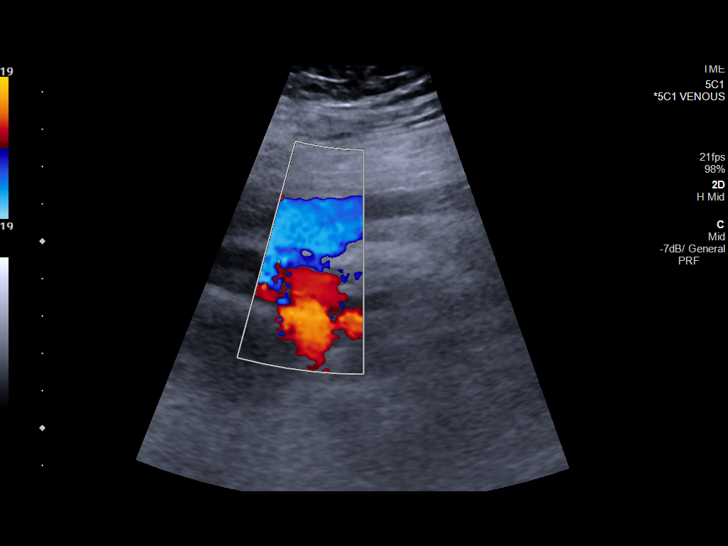
[im 24/69]
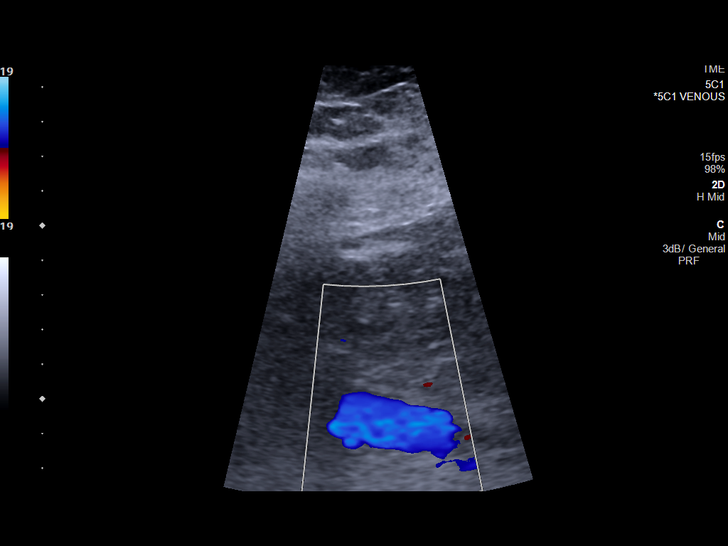
[im 30/69]
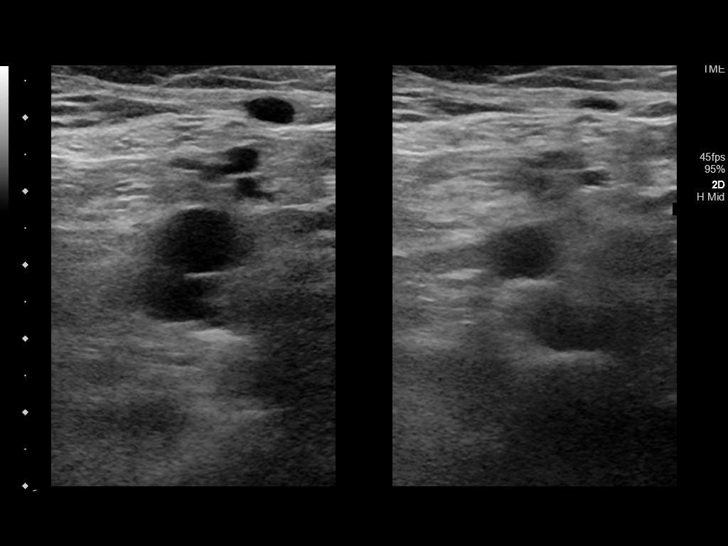
[im 36/69]
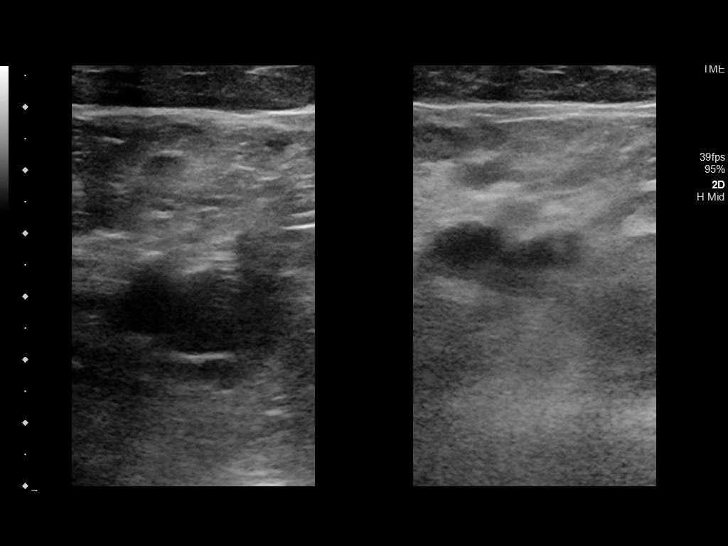
[im 39/69]
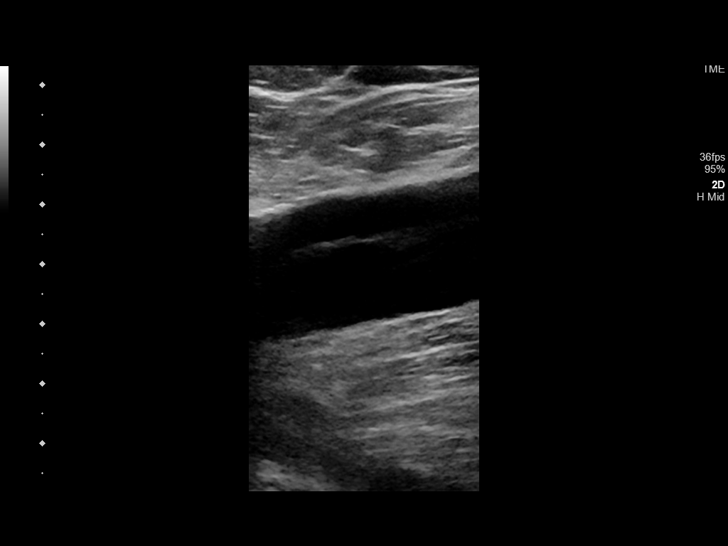
[im 45/69]
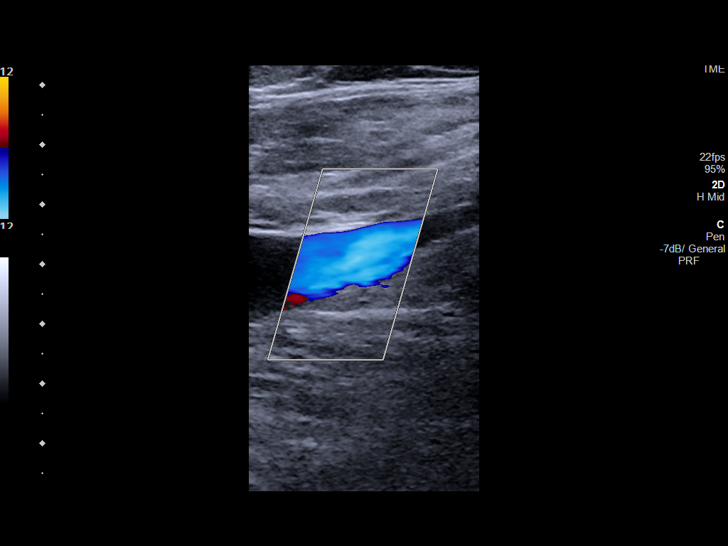
[im 51/69]
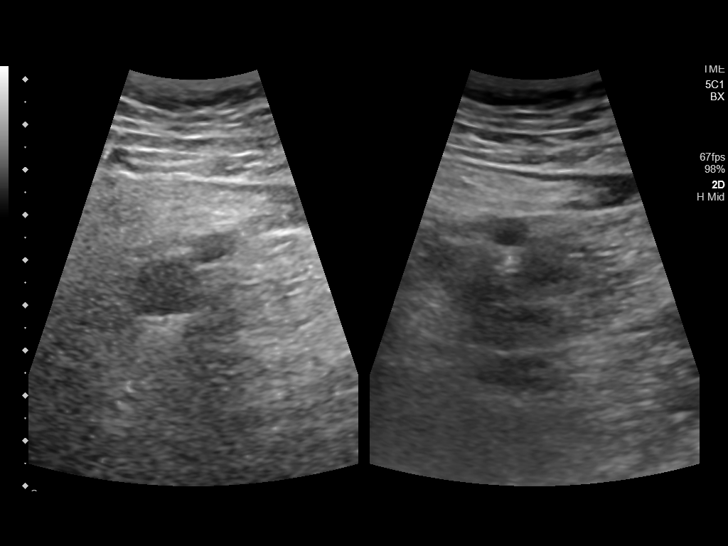
[im 57/69]
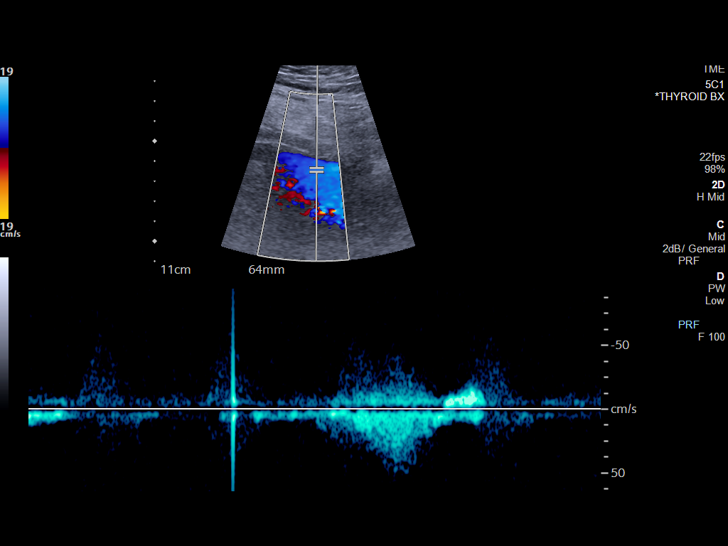
[im 63/69]
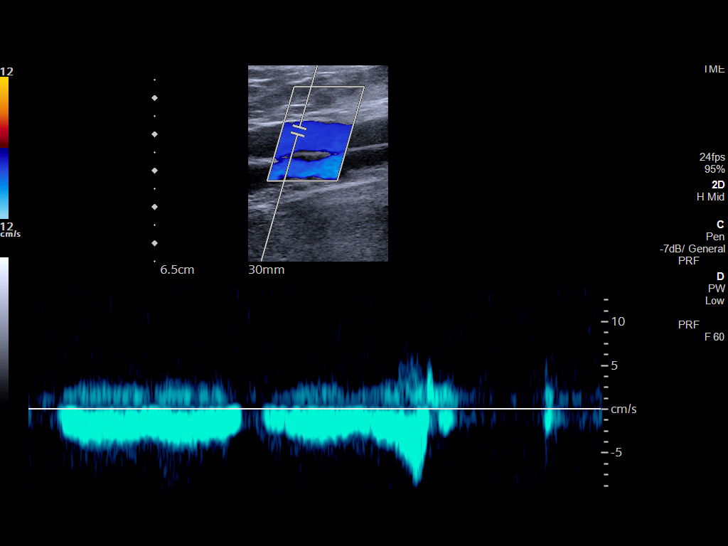
[im 69/69]
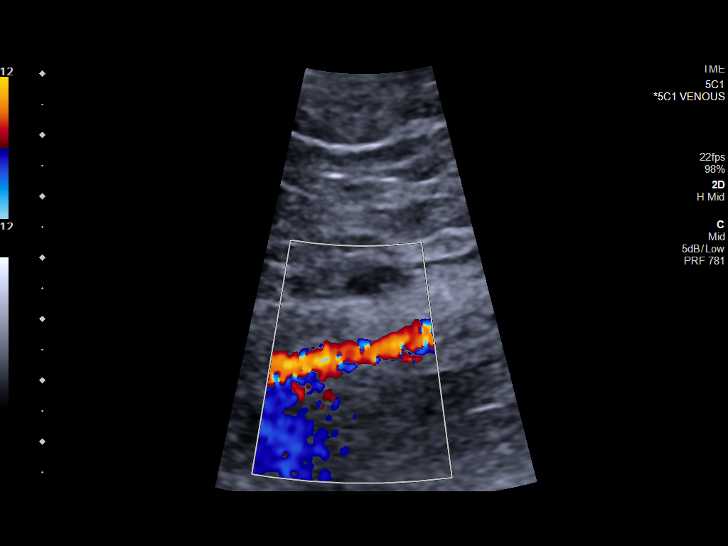

[13 of 24 positions shown; findings below may reference images not displayed]

FINDINGS: Evaluation is limited due to patient's body habitus.

RIGHT LOWER EXTREMITY

Common Femoral Vein: No evidence of thrombus. Normal
compressibility, respiratory phasicity and response to augmentation.

Saphenofemoral Junction: No evidence of thrombus. Normal
compressibility and flow on color Doppler imaging.

Profunda Femoral Vein: No evidence of thrombus. Normal
compressibility and flow on color Doppler imaging.

Femoral Vein: No evidence of thrombus. Normal compressibility,
respiratory phasicity and response to augmentation.

Popliteal Vein: No evidence of thrombus. Normal compressibility,
respiratory phasicity and response to augmentation.

Calf Veins: The calf vessels are not well visualized.

Superficial Great Saphenous Vein: No evidence of thrombus. Normal
compressibility.

Venous Reflux:  None.

Other Findings:  None.

LEFT LOWER EXTREMITY

Common Femoral Vein: No evidence of thrombus. Normal
compressibility, respiratory phasicity and response to augmentation.

Saphenofemoral Junction: No evidence of thrombus. Normal
compressibility and flow on color Doppler imaging.

Profunda Femoral Vein: No evidence of thrombus. Normal
compressibility and flow on color Doppler imaging.

Femoral Vein: No evidence of thrombus. Normal compressibility,
respiratory phasicity and response to augmentation.

Popliteal Vein: No evidence of thrombus. Normal compressibility,
respiratory phasicity and response to augmentation.

Calf Veins: The calf veins are poorly visualized.

Superficial Great Saphenous Vein: No evidence of thrombus. Normal
compressibility.

Venous Reflux:  None.

Other Findings:  None.
IMPRESSION: No definite evidence of deep venous thrombosis in either lower
extremity.

## 2020-11-07 NOTE — Progress Notes (Unsigned)
Cardiology Office Note:    Date:  11/08/2020   ID:  Aaron Hale, DOB 1961/03/05, MRN [id number]  PCP:  Aretta Nip, MD  Cardiologist:  Donato Heinz, MD  Electrophysiologist:  None   Referring MD: Aretta Nip, MD   Chief Complaint  Patient presents with  . Follow-up    3 months.  . Congestive Heart Failure    History of Present Illness:    Aaron Hale is a 60 y.o. male with a hx of HFpEF, hypertension, obesity, CKD stage IV who presents for follow-up.  TTE 05/04/2019 showed normal LV systolic function, moderate LVH, grade 1 diastolic dysfunction, normal RV function, no significant valvular disease.  He had been started on torsemide and lost about 60 pounds over the prior 6 months.   At initial clinic visit on 11/18/2019, spironolactone was discontinued given his CKD stage IV.  BMP checked that day showed potassium 6.0.  His losartan was also discontinued.  Repeat BMP on 11/21/2019 showed improvement to potassium 5.5.  Subsequently had follow-up with nephrology, with labs on 3/25 showing improvement in creatinine to 2.1 and potassium 3.9.  Since last clinic visit, he reports that he is doing well.  He has been taken off oxygen.  Reports dyspnea has improved.  BP has been 130s to 150s when he checks at home.  He denies any chest pain  Since last clinic visit, he reports that his main complaint is feeling tired all the time.  Has been feeling short of breath, denies any chest pain.  He was seen by his nephrologist 3 weeks ago, torsemide dose was increased to 40 mg daily as he had gained 30 pounds.    Wt Readings from Last 3 Encounters:  11/08/20 (!) 384 lb (174.2 kg)  08/11/20 (!) 379 lb 6.4 oz (172.1 kg)  05/25/20 (!) 358 lb (162.4 kg)     Past Medical History:  Diagnosis Date  . CHF (congestive heart failure) ([place])   . Hypertension   . Morbid obesity ([place])   . Shortness of breath 03/30/2020    History reviewed. No pertinent surgical history.  Current  Medications: Current Meds  Medication Sig  . amLODipine (NORVASC) 5 MG tablet Take 1 tablet (5 mg total) by mouth daily.  Marland Kitchen aspirin 81 MG chewable tablet Chew by mouth daily.  . carvedilol (COREG) 12.5 MG tablet Take 1 tablet (12.5 mg total) by mouth 2 (two) times daily with a meal.  . rosuvastatin (CRESTOR) 10 MG tablet Take 1 tablet (10 mg total) by mouth daily.  Marland Kitchen torsemide (DEMADEX) 20 MG tablet Take 1 tablet (20 mg total) by mouth daily. (Patient taking differently: Take 40 mg by mouth at bedtime.)     Allergies:   Spironolactone   Social History   Socioeconomic History  . Marital status: Single    Spouse name: Not on file  . Number of children: Not on file  . Years of education: Not on file  . Highest education level: Not on file  Occupational History  . Occupation: retired  Tobacco Use  . Smoking status: Never Smoker  . Smokeless tobacco: Never Used  Vaping Use  . Vaping Use: Never used  Substance and Sexual Activity  . Alcohol use: Not Currently  . Drug use: No  . Sexual activity: Not Currently  Other Topics Concern  . Not on file  Social History Narrative  . Not on file   Social Determinants of Health   Financial Resource Strain: Not on  file  Food Insecurity: Not on file  Transportation Needs: Not on file  Physical Activity: Not on file  Stress: Not on file  Social Connections: Not on file     Family History: The patient's family history includes CAD in his mother.  ROS:   Please see the history of present illness.     All other systems reviewed and are negative.  EKGs/Labs/Other Studies Reviewed:    The following studies were reviewed today:   EKG:  EKG is  ordered today.  The ekg ordered today demonstrates normal sinus rhythm with PVCs, rate 83, left bundle branch block, first-degree AV block  TTE 05/04/19: 1. The left ventricle has normal systolic function with an ejection  fraction of 60-65%. The cavity size was normal. There is moderately   increased left ventricular wall thickness. Left ventricular diastolic  Doppler parameters are consistent with impaired  relaxation. No evidence of left ventricular regional wall motion  abnormalities.  2. The right ventricle has normal systolic function. The cavity was  normal. There is no increase in right ventricular wall thickness.  3. The mitral valve is grossly normal.  4. The tricuspid valve is grossly normal.  5. The aortic valve is tricuspid. Aortic valve regurgitation was not  assessed by color flow Doppler.  6. The aorta is normal unless otherwise noted.   Recent Labs: 11/18/2019: ALT 18; BNP 17.4 12/16/2019: BUN 45; Creatinine, Ser 1.98; Magnesium 2.3; Potassium 4.2; Sodium 142  Recent Lipid Panel No results found for: CHOL, TRIG, HDL, CHOLHDL, VLDL, LDLCALC, LDLDIRECT  Physical Exam:    VS:  BP 132/78 (BP Location: Left Arm, Patient Position: Sitting, Cuff Size: Large)   Pulse 83   Ht '5\' 10"'$  (1.778 m)   Wt (!) 384 lb (174.2 kg)   BMI 55.10 kg/m     Wt Readings from Last 3 Encounters:  11/08/20 (!) 384 lb (174.2 kg)  08/11/20 (!) 379 lb 6.4 oz (172.1 kg)  05/25/20 (!) 358 lb (162.4 kg)     GEN: in no acute distress HEENT: Normal NECK: No JVD appreciated, but difficult to assess given body habitus LYMPHATICS: No lymphadenopathy CARDIAC: RRR, no murmurs, rubs, gallops RESPIRATORY: CTAB ABDOMEN: Soft, non-tender, non-distended MUSCULOSKELETAL:  trace edema; No deformity  SKIN: Warm and dry NEUROLOGIC:  Alert and oriented x 3 PSYCHIATRIC:  Normal affect   ASSESSMENT:    1. Chronic diastolic (congestive) heart failure ([place])   2. Essential hypertension   3. Stage 3 chronic kidney disease, unspecified whether stage 3a or 3b CKD ([place])   4. Chronic respiratory insufficiency   5. Hyperlipidemia, unspecified hyperlipidemia type     PLAN:    Chronic respiratory insufficiency: had been on 2L O2 chronically, but now is off oxygen.  Has been attributed to  HFpEF, which may be contributing but had only mild diastolic dysfunction on most recent TTE.  Concern for underlying pulmonary disease.  Seen by pulmonology: CT chest unremarkable, PFTs unremarkable.  Suspect OHS/OSA.  Recently underwent sleep study, showed severe OSA -Weight is up nearly 30 pounds from last clinic visit.  He had decreased his torsemide dose to 20 mg daily.  Seen by nephrology 2 weeks ago, torsemide dose increased to 40 mg daily.  Continue torsemide 40 mg daily, will check BMP/magnesium  Hypertension: On carvedilol 12.5 mg twice daily, amlodipine 5 mg daily, torsemide 40 mg daily.  Spironolactone and losartan were discontinued due to hyperkalemia.  Carvedilol had previously been increased to 25 mg twice daily but was  decreased to 12.5 mg due to fatigue and amlodipine 5 mg daily was added.  CKD stage III: Follows with Dr Hollie Salk in nephrology.  Most recent labs showed improvement in creatinine to 1.5 in 10/2020, down from peak of 3.2 on 11/03/2019  Hyperlipidemia: On rosuvastatin 40 mg daily, LDL 31.  Rosuvastatin dose decreased to 10 mg daily  OSA: Diagnosed last year, still awaiting his BiPAP   RTC in 6 months   Medication Adjustments/Labs and Tests Ordered: Current medicines are reviewed at length with the patient today.  Concerns regarding medicines are outlined above.  Orders Placed This Encounter  Procedures  . Basic metabolic panel  . Magnesium  . EKG 12-Lead   No orders of the defined types were placed in this encounter.   Patient Instructions  Medication Instructions:  Your physician recommends that you continue on your current medications as directed. Please refer to the Current Medication list given to you today.  *If you need a refill on your cardiac medications before your next appointment, please call your pharmacy*   Lab Work: BMET, [place] today  If you have labs (blood work) drawn today and your tests are completely normal, you will receive your results  only by: Marland Kitchen MyChart Message (if you have MyChart) OR . A paper copy in the mail If you have any lab test that is abnormal or we need to change your treatment, we will call you to review the results.  Follow-Up: At [facility], you and your health needs are our priority.  As part of our continuing mission to provide you with exceptional heart care, we have created designated Provider Care Teams.  These Care Teams include your primary Cardiologist (physician) and Advanced Practice Providers (APPs -  Physician Assistants and Nurse Practitioners) who all work together to provide you with the care you need, when you need it.  We recommend signing up for the patient portal called "MyChart".  Sign up information is provided on this After Visit Summary.  MyChart is used to connect with patients for Virtual Visits (Telemedicine).  Patients are able to view lab/test results, encounter notes, upcoming appointments, etc.  Non-urgent messages can be sent to your provider as well.   To learn more about what you can do with MyChart, go to [email or web address].    Your next appointment:   6 month(s)  The format for your next appointment:   In Person  Provider:   Oswaldo Milian, MD   Other Instructions Please call [place] for update on Bipap machine [phone]      Signed, Donato Heinz, MD  11/08/2020 6:08 PM    [place]

## 2020-11-08 ENCOUNTER — Ambulatory Visit (INDEPENDENT_AMBULATORY_CARE_PROVIDER_SITE_OTHER): Payer: 59 | Admitting: Cardiology

## 2020-11-08 ENCOUNTER — Encounter: Payer: Self-pay | Admitting: Cardiology

## 2020-11-08 ENCOUNTER — Other Ambulatory Visit: Payer: Self-pay

## 2020-11-08 VITALS — BP 132/78 | HR 83 | Ht 70.0 in | Wt 384.0 lb

## 2020-11-08 DIAGNOSIS — E785 Hyperlipidemia, unspecified: Secondary | ICD-10-CM

## 2020-11-08 DIAGNOSIS — N183 Chronic kidney disease, stage 3 unspecified: Secondary | ICD-10-CM

## 2020-11-08 DIAGNOSIS — I10 Essential (primary) hypertension: Secondary | ICD-10-CM | POA: Diagnosis not present

## 2020-11-08 DIAGNOSIS — R0689 Other abnormalities of breathing: Secondary | ICD-10-CM | POA: Diagnosis not present

## 2020-11-08 DIAGNOSIS — I5032 Chronic diastolic (congestive) heart failure: Secondary | ICD-10-CM

## 2020-11-08 NOTE — Patient Instructions (Signed)
Medication Instructions:  Your physician recommends that you continue on your current medications as directed. Please refer to the Current Medication list given to you today.  *If you need a refill on your cardiac medications before your next appointment, please call your pharmacy*   Lab Work: BMET, [place] today  If you have labs (blood work) drawn today and your tests are completely normal, you will receive your results only by: Marland Kitchen MyChart Message (if you have MyChart) OR . A paper copy in the mail If you have any lab test that is abnormal or we need to change your treatment, we will call you to review the results.  Follow-Up: At [facility], you and your health needs are our priority.  As part of our continuing mission to provide you with exceptional heart care, we have created designated Provider Care Teams.  These Care Teams include your primary Cardiologist (physician) and Advanced Practice Providers (APPs -  Physician Assistants and Nurse Practitioners) who all work together to provide you with the care you need, when you need it.  We recommend signing up for the patient portal called "MyChart".  Sign up information is provided on this After Visit Summary.  MyChart is used to connect with patients for Virtual Visits (Telemedicine).  Patients are able to view lab/test results, encounter notes, upcoming appointments, etc.  Non-urgent messages can be sent to your provider as well.   To learn more about what you can do with MyChart, go to [email or web address].    Your next appointment:   6 month(s)  The format for your next appointment:   In Person  Provider:   Oswaldo Milian, MD   Other Instructions Please call [place] for update on Bipap machine [phone]

## 2020-11-09 ENCOUNTER — Other Ambulatory Visit: Payer: Self-pay | Admitting: Cardiology

## 2020-11-09 LAB — BASIC METABOLIC PANEL
BUN/Creatinine Ratio: 17 (ref 10–24)
BUN: 26 mg/dL (ref 8–27)
CO2: 31 mmol/L — ABNORMAL HIGH (ref 20–29)
Calcium: 9.8 mg/dL (ref 8.6–10.2)
Chloride: 97 mmol/L (ref 96–106)
Creatinine, Ser: 1.54 mg/dL — ABNORMAL HIGH (ref 0.76–1.27)
Glucose: 96 mg/dL (ref 65–99)
Potassium: 5 mmol/L (ref 3.5–5.2)
Sodium: 143 mmol/L (ref 134–144)
eGFR: 51 mL/min/{1.73_m2} — ABNORMAL LOW (ref >59)

## 2020-11-09 LAB — MAGNESIUM: Magnesium: 2.4 mg/dL — ABNORMAL HIGH (ref 1.6–2.3)

## 2020-11-09 NOTE — Telephone Encounter (Signed)
This is Dr. Schumann's pt 

## 2020-11-21 ENCOUNTER — Other Ambulatory Visit: Payer: Self-pay | Admitting: Cardiology

## 2021-01-05 ENCOUNTER — Other Ambulatory Visit: Payer: Self-pay

## 2021-01-05 ENCOUNTER — Inpatient Hospital Stay (HOSPITAL_COMMUNITY)
Admission: EM | Admit: 2021-01-05 | Discharge: 2021-01-13 | DRG: 286 | Disposition: A | Discharge status: Home Health | Payer: Medicare Other | Attending: Internal Medicine | Admitting: Internal Medicine

## 2021-01-05 ENCOUNTER — Emergency Department (HOSPITAL_COMMUNITY): Payer: Medicare Other

## 2021-01-05 ENCOUNTER — Encounter (HOSPITAL_COMMUNITY): Payer: Self-pay | Admitting: Emergency Medicine

## 2021-01-05 DIAGNOSIS — E662 Morbid (severe) obesity with alveolar hypoventilation: Secondary | ICD-10-CM | POA: Diagnosis present

## 2021-01-05 DIAGNOSIS — I130 Hypertensive heart and chronic kidney disease with heart failure and stage 1 through stage 4 chronic kidney disease, or unspecified chronic kidney disease: Secondary | ICD-10-CM | POA: Diagnosis present

## 2021-01-05 DIAGNOSIS — I509 Heart failure, unspecified: Secondary | ICD-10-CM

## 2021-01-05 DIAGNOSIS — J9601 Acute respiratory failure with hypoxia: Secondary | ICD-10-CM | POA: Diagnosis present

## 2021-01-05 DIAGNOSIS — I452 Bifascicular block: Secondary | ICD-10-CM | POA: Diagnosis present

## 2021-01-05 DIAGNOSIS — L03116 Cellulitis of left lower limb: Secondary | ICD-10-CM | POA: Diagnosis not present

## 2021-01-05 DIAGNOSIS — I10 Essential (primary) hypertension: Secondary | ICD-10-CM | POA: Diagnosis not present

## 2021-01-05 DIAGNOSIS — G4733 Obstructive sleep apnea (adult) (pediatric): Secondary | ICD-10-CM | POA: Diagnosis present

## 2021-01-05 DIAGNOSIS — I4819 Other persistent atrial fibrillation: Secondary | ICD-10-CM | POA: Diagnosis present

## 2021-01-05 DIAGNOSIS — E876 Hypokalemia: Secondary | ICD-10-CM | POA: Diagnosis not present

## 2021-01-05 DIAGNOSIS — Z20822 Contact with and (suspected) exposure to covid-19: Secondary | ICD-10-CM | POA: Diagnosis present

## 2021-01-05 DIAGNOSIS — E785 Hyperlipidemia, unspecified: Secondary | ICD-10-CM | POA: Diagnosis present

## 2021-01-05 DIAGNOSIS — I872 Venous insufficiency (chronic) (peripheral): Secondary | ICD-10-CM | POA: Diagnosis present

## 2021-01-05 DIAGNOSIS — I5031 Acute diastolic (congestive) heart failure: Secondary | ICD-10-CM | POA: Diagnosis not present

## 2021-01-05 DIAGNOSIS — N184 Chronic kidney disease, stage 4 (severe): Secondary | ICD-10-CM | POA: Diagnosis present

## 2021-01-05 DIAGNOSIS — I440 Atrioventricular block, first degree: Secondary | ICD-10-CM | POA: Diagnosis present

## 2021-01-05 DIAGNOSIS — Z8249 Family history of ischemic heart disease and other diseases of the circulatory system: Secondary | ICD-10-CM

## 2021-01-05 DIAGNOSIS — Z79899 Other long term (current) drug therapy: Secondary | ICD-10-CM

## 2021-01-05 DIAGNOSIS — N179 Acute kidney failure, unspecified: Secondary | ICD-10-CM | POA: Diagnosis not present

## 2021-01-05 DIAGNOSIS — E782 Mixed hyperlipidemia: Secondary | ICD-10-CM | POA: Diagnosis not present

## 2021-01-05 DIAGNOSIS — Z888 Allergy status to other drugs, medicaments and biological substances status: Secondary | ICD-10-CM

## 2021-01-05 DIAGNOSIS — I272 Pulmonary hypertension, unspecified: Secondary | ICD-10-CM | POA: Diagnosis not present

## 2021-01-05 DIAGNOSIS — L03115 Cellulitis of right lower limb: Secondary | ICD-10-CM | POA: Diagnosis not present

## 2021-01-05 DIAGNOSIS — I5032 Chronic diastolic (congestive) heart failure: Secondary | ICD-10-CM | POA: Diagnosis not present

## 2021-01-05 DIAGNOSIS — Z9119 Patient's noncompliance with other medical treatment and regimen: Secondary | ICD-10-CM | POA: Diagnosis not present

## 2021-01-05 DIAGNOSIS — R0902 Hypoxemia: Secondary | ICD-10-CM

## 2021-01-05 DIAGNOSIS — Z6841 Body Mass Index (BMI) 40.0 and over, adult: Secondary | ICD-10-CM

## 2021-01-05 DIAGNOSIS — R0600 Dyspnea, unspecified: Secondary | ICD-10-CM | POA: Diagnosis present

## 2021-01-05 DIAGNOSIS — I5033 Acute on chronic diastolic (congestive) heart failure: Secondary | ICD-10-CM | POA: Diagnosis present

## 2021-01-05 DIAGNOSIS — Z7982 Long term (current) use of aspirin: Secondary | ICD-10-CM | POA: Diagnosis not present

## 2021-01-05 DIAGNOSIS — N183 Chronic kidney disease, stage 3 unspecified: Secondary | ICD-10-CM | POA: Diagnosis not present

## 2021-01-05 DIAGNOSIS — R0609 Other forms of dyspnea: Secondary | ICD-10-CM

## 2021-01-05 HISTORY — DX: Sleep apnea, unspecified: G47.30

## 2021-01-05 HISTORY — DX: Chronic kidney disease, unspecified: N18.9

## 2021-01-05 LAB — CBC WITH DIFFERENTIAL/PLATELET
Abs Immature Granulocytes: 0.04 10*3/uL (ref 0.00–0.07)
Basophils Absolute: 0.1 10*3/uL (ref 0.0–0.1)
Basophils Relative: 1 %
Eosinophils Absolute: 0.4 10*3/uL (ref 0.0–0.5)
Eosinophils Relative: 5 %
HCT: 45.7 % (ref 39.0–52.0)
Hemoglobin: 14.8 g/dL (ref 13.0–17.0)
Immature Granulocytes: 1 %
Lymphocytes Relative: 16 %
Lymphs Abs: 1.2 10*3/uL (ref 0.7–4.0)
MCH: 28.9 pg (ref 26.0–34.0)
MCHC: 32.4 g/dL (ref 30.0–36.0)
MCV: 89.3 fL (ref 80.0–100.0)
Monocytes Absolute: 0.7 10*3/uL (ref 0.1–1.0)
Monocytes Relative: 9 %
Neutro Abs: 5.3 10*3/uL (ref 1.7–7.7)
Neutrophils Relative %: 68 %
Platelets: 187 10*3/uL (ref 150–400)
RBC: 5.12 MIL/uL (ref 4.22–5.81)
RDW: 14.1 % (ref 11.5–15.5)
WBC: 7.7 10*3/uL (ref 4.0–10.5)
nRBC: 0 % (ref 0.0–0.2)

## 2021-01-05 LAB — I-STAT VENOUS BLOOD GAS, ED
Acid-Base Excess: 11 mmol/L — ABNORMAL HIGH (ref 0.0–2.0)
Bicarbonate: 39.9 mmol/L — ABNORMAL HIGH (ref 20.0–28.0)
Calcium, Ion: 1.13 mmol/L — ABNORMAL LOW (ref 1.15–1.40)
HCT: 45 % (ref 39.0–52.0)
Hemoglobin: 15.3 g/dL (ref 13.0–17.0)
O2 Saturation: 68 %
Potassium: 3.8 mmol/L (ref 3.5–5.1)
Sodium: 144 mmol/L (ref 135–145)
TCO2: 42 mmol/L — ABNORMAL HIGH (ref 22–32)
pCO2, Ven: 71.4 mmHg (ref 44.0–60.0)
pH, Ven: 7.355 (ref 7.250–7.430)
pO2, Ven: 39 mmHg (ref 32.0–45.0)

## 2021-01-05 LAB — COMPREHENSIVE METABOLIC PANEL
ALT: 16 U/L (ref 0–44)
AST: 19 U/L (ref 15–41)
Albumin: 3.7 g/dL (ref 3.5–5.0)
Alkaline Phosphatase: 39 U/L (ref 38–126)
Anion gap: 9 (ref 5–15)
BUN: 25 mg/dL — ABNORMAL HIGH (ref 6–20)
CO2: 33 mmol/L — ABNORMAL HIGH (ref 22–32)
Calcium: 9.2 mg/dL (ref 8.9–10.3)
Chloride: 100 mmol/L (ref 98–111)
Creatinine, Ser: 1.56 mg/dL — ABNORMAL HIGH (ref 0.61–1.24)
GFR, Estimated: 51 mL/min — ABNORMAL LOW (ref >60)
Glucose, Bld: 100 mg/dL — ABNORMAL HIGH (ref 70–99)
Potassium: 3.6 mmol/L (ref 3.5–5.1)
Sodium: 142 mmol/L (ref 135–145)
Total Bilirubin: 0.9 mg/dL (ref 0.3–1.2)
Total Protein: 7.7 g/dL (ref 6.5–8.1)

## 2021-01-05 LAB — BRAIN NATRIURETIC PEPTIDE: B Natriuretic Peptide: 31 pg/mL (ref 0.0–100.0)

## 2021-01-05 LAB — TROPONIN I (HIGH SENSITIVITY): Troponin I (High Sensitivity): 13 ng/L (ref <18)

## 2021-01-05 MED ORDER — ROSUVASTATIN CALCIUM 5 MG PO TABS
10.0000 mg | ORAL_TABLET | Freq: Every day | ORAL | Status: DC
  Administered 2021-01-06 – 2021-01-13 (×8): 10 mg via ORAL
  Filled 2021-01-05 (×8): qty 2

## 2021-01-05 MED ORDER — ONDANSETRON HCL 4 MG/2ML IJ SOLN: 4.0000 mg | Freq: Four times a day (QID) | INTRAMUSCULAR | Status: DC | PRN

## 2021-01-05 MED ORDER — ACETAMINOPHEN 325 MG PO TABS
650.0000 mg | ORAL_TABLET | Freq: Four times a day (QID) | ORAL | Status: DC | PRN
  Administered 2021-01-07 – 2021-01-10 (×3): 650 mg via ORAL
  Filled 2021-01-05 (×3): qty 2

## 2021-01-05 MED ORDER — CARVEDILOL 12.5 MG PO TABS
12.5000 mg | ORAL_TABLET | Freq: Two times a day (BID) | ORAL | Status: DC
  Administered 2021-01-06 – 2021-01-13 (×15): 12.5 mg via ORAL
  Filled 2021-01-05 (×15): qty 1

## 2021-01-05 MED ORDER — AMLODIPINE BESYLATE 5 MG PO TABS
5.0000 mg | ORAL_TABLET | Freq: Every day | ORAL | Status: DC
  Administered 2021-01-06: 5 mg via ORAL
  Filled 2021-01-05: qty 1

## 2021-01-05 MED ORDER — TORSEMIDE 20 MG PO TABS
40.0000 mg | ORAL_TABLET | Freq: Every day | ORAL | Status: DC
  Administered 2021-01-05: 40 mg via ORAL
  Filled 2021-01-05: qty 2

## 2021-01-05 MED ORDER — POTASSIUM CHLORIDE CRYS ER 20 MEQ PO TBCR
20.0000 meq | EXTENDED_RELEASE_TABLET | Freq: Every day | ORAL | Status: AC
  Administered 2021-01-05 – 2021-01-06 (×2): 20 meq via ORAL
  Filled 2021-01-05 (×2): qty 1

## 2021-01-05 MED ORDER — ENOXAPARIN SODIUM 100 MG/ML IJ SOSY
90.0000 mg | PREFILLED_SYRINGE | INTRAMUSCULAR | Status: DC
  Administered 2021-01-05 – 2021-01-12 (×8): 90 mg via SUBCUTANEOUS
  Filled 2021-01-05: qty 1
  Filled 2021-01-05: qty 0.9
  Filled 2021-01-05 (×6): qty 1

## 2021-01-05 MED ORDER — MELATONIN 3 MG PO TABS
3.0000 mg | ORAL_TABLET | Freq: Every evening | ORAL | Status: DC | PRN
  Filled 2021-01-05: qty 1

## 2021-01-05 MED ORDER — ASPIRIN 81 MG PO CHEW
81.0000 mg | CHEWABLE_TABLET | Freq: Every day | ORAL | Status: DC
  Administered 2021-01-06 – 2021-01-13 (×8): 81 mg via ORAL
  Filled 2021-01-05 (×8): qty 1

## 2021-01-05 MED ORDER — FUROSEMIDE 10 MG/ML IJ SOLN
40.0000 mg | Freq: Every day | INTRAMUSCULAR | Status: DC
  Administered 2021-01-05: 40 mg via INTRAVENOUS
  Filled 2021-01-05: qty 4

## 2021-01-05 NOTE — ED Provider Notes (Signed)
[facility] [place] EMERGENCY DEPARTMENT Provider Note   CSN: [id number] Arrival date & time: 01/05/21  1356     History Chief Complaint  Patient presents with  . Leg Swelling    Aaron Hale is a 60 y.o. male with past medical history significant for stage IV CKD, HFpEF, and persistent atrial fibrillation who presents the ED with complaints of hypoxia, shortness of breath, and progressively worsening bilateral leg swelling.  I reviewed patient's medical record and he was seen by his cardiologist, Dr. Gardiner Rhyme, 11/08/2020.  TTE 05/03/2021 showed normal LV systolic function and moderate grade 1 diastolic dysfunction.  He recently had his torsemide dose increased to 40 mg by his nephrologist after he gained 30 pounds.  On my examination, patient reports that he was down to 315 pounds at one point last year, but is now over 400 lbs.  He states that he feels short of breath with exertion, but states that this is not anything that is unusual for him.  Suspect deconditioning.  He admits that he does not exercise like he should.  He reports that his lower legs get swollen when he sits down and they are not elevated.    He states that he went to his nephrologist with [facility] where he was found to be 84% on room air and was subsequently sent here to the ED for likely diuresis in context of shortness of breath symptoms.  Denies any fevers, chills, hemoptysis, cough, unilateral swelling, history of clots or clotting disorder, recent surgeries or immobilization, chest pain, or other symptoms.  He does not smoke tobacco.  HPI     Past Medical History:  Diagnosis Date  . CHF (congestive heart failure) ([place])   . Hypertension   . Morbid obesity ([place])   . Shortness of breath 03/30/2020    Patient Active Problem List   Diagnosis Date Noted  . Acute exacerbation of CHF (congestive heart failure) ([place]) 01/05/2021  . Chronic diastolic (congestive) heart failure ([place]) 08/11/2020  . Essential  hypertension 08/11/2020  . CRI (chronic renal insufficiency), stage 4 (severe) ([place]) 08/11/2020  . Sleep apnea 08/11/2020  . Morbid obesity ([place]) 08/11/2020  . Shortness of breath 03/30/2020  . Cellulitis 05/03/2019    History reviewed. No pertinent surgical history.     Family History  Problem Relation Age of Onset  . CAD Mother     Social History   Tobacco Use  . Smoking status: Never Smoker  . Smokeless tobacco: Never Used  Vaping Use  . Vaping Use: Never used  Substance Use Topics  . Alcohol use: Not Currently  . Drug use: No    Home Medications Prior to Admission medications   Medication Sig Start Date End Date Taking? Authorizing Provider  amLODipine (NORVASC) 5 MG tablet Take 1 tablet (5 mg total) by mouth daily. 08/11/20 11/09/20  Erlene Quan, PA-C  aspirin 81 MG chewable tablet Chew by mouth daily.    [provider]  carvedilol (COREG) 12.5 MG tablet Take 1 tablet (12.5 mg total) by mouth 2 (two) times daily with a meal. 08/11/20   Kilroy, Doreene Burke, PA-C  rosuvastatin (CRESTOR) 10 MG tablet Take 1 tablet by mouth once daily 11/09/20   Donato Heinz, MD  torsemide [facility]) 20 MG tablet Take 1 tablet by mouth once daily 11/22/20   Donato Heinz, MD    Allergies    Spironolactone  Review of Systems   Review of Systems  All other systems reviewed and  are negative.   Physical Exam Updated Vital Signs BP 133/80   Pulse 88   Temp 98.4 F (36.9 C) (Oral)   Resp 17   Ht '5\' 11"'$  (1.803 m)   Wt (!) 182.3 kg   SpO2 90%   BMI 56.07 kg/m   Physical Exam Vitals and nursing note reviewed. Exam conducted with a chaperone present.  Constitutional:      General: He is not in acute distress.    Appearance: He is not toxic-appearing.  HENT:     Head: Normocephalic and atraumatic.  Eyes:     General: No scleral icterus.    Conjunctiva/sclera: Conjunctivae normal.  Cardiovascular:     Rate and Rhythm: Normal rate.     Pulses: Normal  pulses.  Pulmonary:     Effort: Pulmonary effort is normal. No respiratory distress.     Breath sounds: Normal breath sounds. No wheezing or rales.     Comments: CTA bilaterally.  Speaks in full sentences.  Oxygen at 86% on RA.  Saturating well in 90s on 3 L supplemental oxygen.  No significant increased work of breathing.  No accessory muscle movement. Musculoskeletal:     Right lower leg: Edema present.     Left lower leg: Edema present.     Comments: 2+ pitting edema bilaterally with overlying skin changes suggestive of venous stasis dermatitis.  Pitting pedal edema, as well.  Peripheral pulses intact and symmetric.  Sensation intact throughout.  Moves all extremities with strength intact against resistance.  Skin:    General: Skin is dry.  Neurological:     Mental Status: He is alert and oriented to person, place, and time.     GCS: GCS eye subscore is 4. GCS verbal subscore is 5. GCS motor subscore is 6.  Psychiatric:        Mood and Affect: Mood normal.        Behavior: Behavior normal.        Thought Content: Thought content normal.     ED Results / Procedures / Treatments   Labs (all labs ordered are listed, but only abnormal results are displayed) Labs Reviewed  COMPREHENSIVE METABOLIC PANEL - Abnormal; Notable for the following components:      Result Value   CO2 33 (*)    Glucose, Bld 100 (*)    BUN 25 (*)    Creatinine, Ser 1.56 (*)    GFR, Estimated 51 (*)    All other components within normal limits  CBC WITH DIFFERENTIAL/PLATELET  BRAIN NATRIURETIC PEPTIDE  TROPONIN I (HIGH SENSITIVITY)    EKG EKG Interpretation  Date/Time:  Wednesday Jan 05 2021 15:41:47 EDT Ventricular Rate:  117 PR Interval:    QRS Duration: 138 QT Interval:  402 QTC Calculation: 564 R Axis:   -83 Text Interpretation: Sinus rhythm Premature ventricular complexes RBBB and LAFB Non-specific ST-t changes Confirmed by Lajean Saver [id number]) on 01/05/2021 5:41:11 PM   Radiology DG Chest 2  View  Result Date: 01/05/2021 CLINICAL DATA:  Shortness of breath EXAM: CHEST - 2 VIEW COMPARISON:  05/03/2019 FINDINGS: Stable cardiomegaly. Mild pulmonary vascular congestion. Increased interstitial markings within the perihilar and bibasilar regions. No pleural effusion or pneumothorax. IMPRESSION: Findings suggest CHF with mild interstitial edema. Electronically Signed   By: Davina Poke D.O.   On: 01/05/2021 15:13    Procedures Procedures   Medications Ordered in ED Medications  torsemide (DEMADEX) tablet 40 mg (40 mg Oral Given 01/05/21 1651)    ED Course  I have reviewed the triage vital signs and the nursing notes.  Pertinent labs & imaging results that were available during my care of the patient were reviewed by me and considered in my medical decision making (see chart for details).  Clinical Course as of 01/05/21 1941  Wed Jan 05, 2021  1938 I spoke with hospitalist who will see and admit patient for his dyspnea.  [GG]    Clinical Course User Index [GG] Corena Herter, PA-C   MDM Rules/Calculators/A&P                          Keyone Schrage was evaluated in Emergency Department on 01/05/2021 for the symptoms described in the history of present illness. He was evaluated in the context of the global COVID-19 pandemic, which necessitated consideration that the patient might be at risk for infection with the SARS-CoV-2 virus that causes COVID-19. Institutional protocols and algorithms that pertain to the evaluation of patients at risk for COVID-19 are in a state of rapid change based on information released by regulatory bodies including the CDC and federal and state organizations. These policies and algorithms were followed during the patient's care in the ED.  I personally reviewed patient's medical chart and all notes from triage and staff during today's encounter. I have also ordered and reviewed all labs and imaging that I felt to be medically necessary in the evaluation of  this patient's complaints and with consideration of their physical exam. If needed, translation services were available and utilized.   Patient is accompanied by his wife who is now at bedside.  Evidently he has been using supplemental oxygen at home regularly.  Essentially 24/7.  He initially told me that he was only doing at night, but then clarified later in the exam that that was his instructions.  However, he felt short of breath so used it more.  They went to see Dr. Hollie Salk, [facility], who stated that he needed to be admitted to the hospital.  When I told them that his work-up today was largely reassuring, they were concerned because he is still requiring increased amounts of oxygen and Dr. Hollie Salk had wanted him admitted to the hospital.  He does not seem to be exhibiting any significant increased work of breathing, but becomes very short of breath with exertion.  Patient is requiring 3 L supplemental oxygen here in the ED to maintain saturation.  He will intermittently dip in the 80s.  Given his progressively worsening shortness of breath, does not feel comfortable going home.  Discussed case with Dr. Ashok Cordia.  Will admit to hospitalist for hypoxia.  I spoke with hospitalist who will see and admit patient for his dyspnea.   Final Clinical Impression(s) / ED Diagnoses Final diagnoses:  Hypoxia  Dyspnea on exertion    Rx / DC Orders ED Discharge Orders    None       Corena Herter, PA-C 01/05/21 1941    Lajean Saver, MD 01/05/21 2048

## 2021-01-05 NOTE — ED Notes (Signed)
Pt O2sat 86% placed 2L [place]

## 2021-01-05 NOTE — ED Notes (Signed)
Pt ambulated to bathroom. Recommended using urinal but pt refused.

## 2021-01-05 NOTE — ED Notes (Signed)
Pt would like provider to speak with girlfriend once she arrives. Pt feels his girlfriend can explain his condition better.

## 2021-01-05 NOTE — ED Provider Notes (Signed)
Emergency Medicine Provider Triage Evaluation Note  Aaron Hale 60 y.o. male was evaluated in triage.  Pt complains of worsening bilateral lower extremity swelling.  He states he has had swelling for "a while" and takes a fluid pill.  He states that despite that, getting worse.  He went to his doctor for routine checkup today and was noted to have O2 sats of 86% on room air.  He was prompted to go to the emergency department for further evaluation.  Denies any chest pain, fevers.  Review of Systems  Positive: Bilateral lower extremity swelling, shortness of breath Negative: Chest pain, fever  Physical Exam  BP 134/82   Pulse 70   Temp 98.2 F (36.8 C) (Oral)   Resp 18   Ht '5\' 4"'$  (1.626 m)   Wt 65.8 kg   SpO2 100%   BMI 24.89 kg/m  Gen:   Awake, no distress   HEENT:  Atraumatic  Resp:  Normal effort.  Rales noted at bilateral bases.  Able to speak in full sentences without any difficulty. Cardiac:  Normal rate  Abd:   Nondistended, nontender  MSK:   Moves extremities without difficulty.  2+ pitting edema in bilateral lower extremities. Neuro:  Speech clear   Medical Decision Making  Medically screening exam initiated at 3:55 AM.  Appropriate orders placed.  [name] was informed that the remainder of the evaluation will be completed by another provider, this initial triage assessment does not replace that evaluation, and the importance of remaining in the ED until their evaluation is complete.  Clinical Impression  Edema   Portions of this note were generated with Dragon dictation software. Dictation errors may occur despite best attempts at proofreading.     Volanda Napoleon, PA-C 01/05/21 1406    Arnaldo Natal, MD 01/05/21 1520

## 2021-01-05 NOTE — ED Notes (Signed)
Report called to RN receiving pt in room 6E4.

## 2021-01-05 NOTE — H&P (Signed)
History and Physical  Aaron Hale [id number] DOB: 02/04/1961 DOA: 01/05/2021  Referring physician: Dr. Nyoka Cowden, [place] PCP: Radene Ou Bill [place], MD  Outpatient Specialists: Cardiology, nephrology. Patient coming from: Nephrology's office through home.  Chief Complaint: Shortness of breath, low oxygen level, weight gain, lower extremity swelling.  HPI: Aaron Hale is a 60 y.o. male with medical history significant for severe morbid obesity with BMI 56, OSA not yet started home BIPAP, CKD 3B, chronic diastolic CHF, who presented to [facility] ED from her nephrologist office.  He was found to be hypoxic with O2 saturation 84% on room air.  Noted to have a 30 pound weight gain in 1 month.  His nephrologist recommended that he presented to the ED for further evaluation.  Patient reports he has had worsening shortness of breath with minimal exertion.  Associated with orthopnea and bilateral lower extremity edema.  Upon presentation to the ED patient appears volume overload.  Chest x-ray showing mild pulmonary edema.  On exam he has bilateral lower extremity edema.  Received a dose of torsemide in the ED 40 mg x 1.  TRH, hospitalist team, asked to admit.  ED Course: T-max 99.2.  BP 133/80, pulse rate 88, respiratory 17, with saturation 90% on 2 L.  Lab studies remarkable for serum bicarb 33, BUN 25, creatinine 1.56, anion gap 9, GFR 51, BNP 31, troponin normal 13.  WBC 7.7, hemoglobin 14.8, neutrophil 68, platelet 187.  Review of Systems: Review of systems as noted in the HPI. All other systems reviewed and are negative.   Past Medical History:  Diagnosis Date  . CHF (congestive heart failure) ([place])   . Hypertension   . Morbid obesity ([place])   . Shortness of breath 03/30/2020   History reviewed. No pertinent surgical history.  Social History:  reports that he has never smoked. He has never used smokeless tobacco. He reports previous alcohol use. He reports that he does not use drugs.   Allergies   Allergen Reactions  . Spironolactone     Family History  Problem Relation Age of Onset  . CAD Mother       Prior to Admission medications   Medication Sig Start Date End Date Taking? Authorizing Provider  amLODipine (NORVASC) 5 MG tablet Take 1 tablet (5 mg total) by mouth daily. 08/11/20 11/09/20  Erlene Quan, PA-C  aspirin 81 MG chewable tablet Chew by mouth daily.    [provider]  carvedilol (COREG) 12.5 MG tablet Take 1 tablet (12.5 mg total) by mouth 2 (two) times daily with a meal. 08/11/20   Kilroy, Doreene Burke, PA-C  rosuvastatin (CRESTOR) 10 MG tablet Take 1 tablet by mouth once daily 11/09/20   Donato Heinz, MD  torsemide [facility]) 20 MG tablet Take 1 tablet by mouth once daily 11/22/20   Donato Heinz, MD    Physical Exam: BP 133/80   Pulse 88   Temp 98.4 F (36.9 C) (Oral)   Resp 17   Ht '5\' 11"'$  (1.803 m)   Wt (!) 182.3 kg   SpO2 90%   BMI 56.07 kg/m   . General: 60 y.o. year-old male well developed well nourished in no acute distress.  Alert and oriented x3. . Cardiovascular: Regular rate and rhythm with no rubs or gallops.  No thyromegaly or JVD noted.  2+ pitting edema lower extremity bilaterally.  Marland Kitchen Respiratory: Diffused rales bilaterally. Good inspiratory effort. . Abdomen: Soft nontender nondistended with normal bowel sounds x4 quadrants. . Muskuloskeletal: No cyanosis  or clubbing. 2+ pitting edema LE bilaterally. . Neuro: CN II-XII intact, strength, sensation, reflexes . Skin: No ulcerative lesions noted or rashes . Psychiatry: Judgement and insight appear normal. Mood is appropriate for condition and setting          Labs on Admission:  Basic Metabolic Panel: Recent Labs  Lab 01/05/21 1418  NA 142  K 3.6  CL 100  CO2 33*  GLUCOSE 100*  BUN 25*  CREATININE 1.56*  CALCIUM 9.2   Liver Function Tests: Recent Labs  Lab 01/05/21 1418  AST 19  ALT 16  ALKPHOS 39  BILITOT 0.9  PROT 7.7  ALBUMIN 3.7   No results  for input(s): LIPASE, AMYLASE in the last 168 hours. No results for input(s): AMMONIA in the last 168 hours. CBC: Recent Labs  Lab 01/05/21 1418  WBC 7.7  NEUTROABS 5.3  HGB 14.8  HCT 45.7  MCV 89.3  PLT 187   Cardiac Enzymes: No results for input(s): CKTOTAL, CKMB, CKMBINDEX, TROPONINI in the last 168 hours.  BNP (last 3 results) Recent Labs    01/05/21 1418  BNP 31.0    ProBNP (last 3 results) No results for input(s): PROBNP in the last 8760 hours.  CBG: No results for input(s): GLUCAP in the last 168 hours.  Radiological Exams on Admission: DG Chest 2 View  Result Date: 01/05/2021 CLINICAL DATA:  Shortness of breath EXAM: CHEST - 2 VIEW COMPARISON:  05/03/2019 FINDINGS: Stable cardiomegaly. Mild pulmonary vascular congestion. Increased interstitial markings within the perihilar and bibasilar regions. No pleural effusion or pneumothorax. IMPRESSION: Findings suggest CHF with mild interstitial edema. Electronically Signed   By: Davina Poke D.O.   On: 01/05/2021 15:13    EKG: I independently viewed the EKG done and my findings are as followed: Sinus tachycardia rate of 117, PVCs, QTC 564.  Nonspecific ST-T changes.  Assessment/Plan Present on Admission: **None**  Active Problems:   Acute exacerbation of CHF (congestive heart failure) ([facility])  Acute exacerbation of diastolic CHF. Presented from nephrology's office due to hypoxia with O2 saturation 84 on room air, dyspnea with minimal exertion, bilateral lower extremity edema, evidence of pulm edema on chest x-ray. Last 2D echo done on 05/04/2019 revealed LVEF 60 to 65%, moderately increased left ventricular wall thickness. Obtain 2D echo, follow results. Start IV diuretics, Lasix 40 mg daily Start strict I's and O's and daily weights Monitor vital signs, electrolytes, urine output while on diuretics.  QTC prolongation Presented with QTC 564 Optimize magnesium and potassium levels Avoid QTC prolonging  agents Repeat twelve-lead EKG in the morning.  Acute on chronic hypoxic respiratory failure likely secondary to pulmonary edema, cardiogenic Reports he is on 2 L nasal cannula as needed at baseline. Presented with O2 saturation 84% today at his nephrologist office Obtain ABG to further assess for the severity of his hypoxia and likely hypercarbia. Maintain O2 saturation above 90%. Continue IV diuresing  CKD 3B Appears to be at his baseline creatinine 1.5 with GFR 51. Avoid nephrotoxic agents Closely monitor urine output Repeat renal panel in the morning  Severe morbid obesity BMI 56 Recommend weight loss outpatient with regular physical activity and healthy dieting.  Essential hypertension BP is currently at goal Resume home oral antihypertensives.  Hyperlipidemia Resume home Crestor.  OSA on BIPAP Patient has declined ABG due to pain, will get VBG Follow PH and PCO2 Agrees to BIPAP QHS   DVT prophylaxis: Subcu Lovenox daily.  Code Status: Full code as stated by the patient  himself.  Family Communication: none at bedside.  He lives with his girlfriend and is independent of his ADLs  Disposition Plan: Admit to telemetry cardiac.  Consults called: Please consult cardiology in the morning.  Admission status: Inpatient status.  Patient will require at least 2 midnights for further evaluation and treatment of present condition.   Status is: Inpatient    Dispo:  Patient From: Home  Planned Disposition: Home, likely on 01/07/2021 when symptomatology has improved.  Medically stable for discharge: No, ongoing management of acute on chronic diastolic CHF, acute on chronic hypoxic respiratory failure secondary to pulmonary edema.         Kayleen Memos MD [facility] Pager [phone]  If 7PM-7AM, please contact night-coverage [email or web address] Password [facility]  01/05/2021, 7:46 PM

## 2021-01-05 NOTE — ED Triage Notes (Signed)
Pt states he has increased swelling in his bilateral lower legs. Pt takes "water pills" but swelling has increased. Pt feels SOB, denies CP. Pt has hx CHF.

## 2021-01-05 NOTE — ED Notes (Signed)
Pt provided [place] sandwich and applesauce.

## 2021-01-05 NOTE — ED Notes (Signed)
Pt ambulated to bathroom 

## 2021-01-05 NOTE — ED Notes (Signed)
Provider approved pt to eat.

## 2021-01-06 ENCOUNTER — Encounter (HOSPITAL_COMMUNITY): Payer: Self-pay | Admitting: Internal Medicine

## 2021-01-06 ENCOUNTER — Encounter (HOSPITAL_COMMUNITY): Payer: 59

## 2021-01-06 ENCOUNTER — Inpatient Hospital Stay (HOSPITAL_COMMUNITY): Payer: Medicare Other

## 2021-01-06 DIAGNOSIS — I5031 Acute diastolic (congestive) heart failure: Secondary | ICD-10-CM | POA: Diagnosis not present

## 2021-01-06 DIAGNOSIS — R0902 Hypoxemia: Secondary | ICD-10-CM

## 2021-01-06 LAB — ECHOCARDIOGRAM COMPLETE
Height: 71 in
Weight: 6331.2 oz

## 2021-01-06 LAB — CBC
HCT: 45.4 % (ref 39.0–52.0)
Hemoglobin: 14.6 g/dL (ref 13.0–17.0)
MCH: 28.6 pg (ref 26.0–34.0)
MCHC: 32.2 g/dL (ref 30.0–36.0)
MCV: 89 fL (ref 80.0–100.0)
Platelets: 175 10*3/uL (ref 150–400)
RBC: 5.1 MIL/uL (ref 4.22–5.81)
RDW: 14.1 % (ref 11.5–15.5)
WBC: 7.8 10*3/uL (ref 4.0–10.5)
nRBC: 0 % (ref 0.0–0.2)

## 2021-01-06 LAB — BASIC METABOLIC PANEL
Anion gap: 9 (ref 5–15)
BUN: 20 mg/dL (ref 6–20)
CO2: 37 mmol/L — ABNORMAL HIGH (ref 22–32)
Calcium: 9.3 mg/dL (ref 8.9–10.3)
Chloride: 95 mmol/L — ABNORMAL LOW (ref 98–111)
Creatinine, Ser: 1.53 mg/dL — ABNORMAL HIGH (ref 0.61–1.24)
GFR, Estimated: 52 mL/min — ABNORMAL LOW (ref >60)
Glucose, Bld: 113 mg/dL — ABNORMAL HIGH (ref 70–99)
Potassium: 3.3 mmol/L — ABNORMAL LOW (ref 3.5–5.1)
Sodium: 141 mmol/L (ref 135–145)

## 2021-01-06 LAB — PHOSPHORUS: Phosphorus: 4.1 mg/dL (ref 2.5–4.6)

## 2021-01-06 LAB — SARS CORONAVIRUS 2 (TAT 6-24 HRS): SARS Coronavirus 2: NEGATIVE

## 2021-01-06 LAB — MAGNESIUM: Magnesium: 2.1 mg/dL (ref 1.7–2.4)

## 2021-01-06 MED ORDER — FUROSEMIDE 10 MG/ML IJ SOLN
40.0000 mg | Freq: Two times a day (BID) | INTRAMUSCULAR | Status: DC
  Administered 2021-01-06: 40 mg via INTRAVENOUS
  Filled 2021-01-06: qty 4

## 2021-01-06 MED ORDER — PERFLUTREN LIPID MICROSPHERE
1.0000 mL | INTRAVENOUS | Status: AC | PRN
  Administered 2021-01-06: 3 mL via INTRAVENOUS
  Filled 2021-01-06: qty 10

## 2021-01-06 MED ORDER — FUROSEMIDE 10 MG/ML IJ SOLN
60.0000 mg | Freq: Two times a day (BID) | INTRAMUSCULAR | Status: DC
  Administered 2021-01-06 – 2021-01-08 (×4): 60 mg via INTRAVENOUS
  Filled 2021-01-06 (×4): qty 6

## 2021-01-06 MED ORDER — POTASSIUM CHLORIDE CRYS ER 20 MEQ PO TBCR
40.0000 meq | EXTENDED_RELEASE_TABLET | Freq: Once | ORAL | Status: AC
  Administered 2021-01-06: 40 meq via ORAL
  Filled 2021-01-06: qty 2

## 2021-01-06 NOTE — Plan of Care (Signed)

## 2021-01-06 NOTE — Progress Notes (Signed)
PT Cancellation Note  Patient Details Name: Aaron Hale MRN: [id number] DOB: [birth year]   Cancelled Treatment:    Reason Eval/Treat Not Completed: Medical issues which prohibited therapy per chart, BLE doppler for DVT r/o pending. Will continue to follow.    Windell Norfolk, DPT, PN1   Supplemental Physical Therapist [facility]    Pager [phone] Acute Rehab Office [phone]

## 2021-01-06 NOTE — Progress Notes (Signed)
Attempted BLE venous but patient refused due to pain intolerance and inability to lay down.  Will attempt tomorrow.  Spoke with [employer], RN.  Results can be found under chart review under CV PROC. 01/06/2021 3:03 PM Laray Corbit RVT, RDMS

## 2021-01-06 NOTE — Progress Notes (Signed)
PROGRESS NOTE    Aaron Hale  [id number] DOB: [birth year] DOA: 01/05/2021 PCP: Aretta Nip, MD    Chief Complaint  Patient presents with  . Leg Swelling    Brief Narrative: 60 year old gentleman with prior history of severe morbid obesity, obstructive sleep apnea, stage IIIb CKD, chronic diastolic heart failure presents from nephrologist office for hypoxia, weight gain and shortness of breath.  Patient also reports orthopnea.  Chest x-ray on admission shows mild pulmonary edema.  He was started on nasal cannula oxygen and admitted for evaluation of acute CHF.  Assessment & Plan:   Active Problems:   Acute exacerbation of CHF (congestive heart failure) (HCC)   Acute on chronic diastolic heart failure Evidence of hypoxemia, orthopnea dyspnea on exertion bilateral lower extremity edema and chest x-ray showing pulmonary edema. Repeat echocardiogram.  Started the patient on IV Lasix 40 twice daily, monitor strict intake and output and daily weights, low-sodium diet. Continue with Coreg.  Hold Norvasc to allow room for blood pressure.    Stage IIIb CKD Creatinine appears to be stable around 1.5 Continue to monitor.    Hypokalemia Replaced.  Hyperlipidemia Continue with Crestor.   Prolonged QTC Recheck twelve-lead EKG. Magnesium is greater than 2, replace potassium keep it greater than 4.    Morbid obesity Follow-up with PCP for weight loss and lifestyle modification.     DVT prophylaxis: Lovenox Code Status: Full code Family Communication: (None at bedside  disposition:   Status is: Inpatient  Remains inpatient appropriate because:IV treatments appropriate due to intensity of illness or inability to take PO   Dispo:  Patient From: Home  Planned Disposition: Home  Medically stable for discharge: No         Consultants:   None  Procedures: None Antimicrobials: None  Subjective: Patient reports his breathing has  improved  Objective: Vitals:   01/06/21 0026 01/06/21 0300 01/06/21 0501 01/06/21 0520  BP: 132/87 (!) 158/89 133/82   Pulse: 79 82 (!) 56   Resp:  18    Temp:  97.9 F (36.6 C)    TempSrc:  Oral    SpO2: 94% 92% 97%   Weight:    (!) 179.5 kg  Height:        Intake/Output Summary (Last 24 hours) at 01/06/2021 0805 Last data filed at 01/06/2021 0100 Gross per 24 hour  Intake 240 ml  Output --  Net 240 ml   Filed Weights   01/05/21 1538 01/05/21 2353 01/06/21 0520  Weight: (!) 182.3 kg (!) 179.4 kg (!) 179.5 kg    Examination:  General exam: Appears calm and comfortable  Respiratory system: Clear to auscultation. Respiratory effort normal. Cardiovascular system: S1 & S2 heard, RRR no murmer.  Gastrointestinal system: Abdomen is nondistended, soft and nontender. Normal bowel sounds heard. Central nervous system: Alert and oriented. No focal neurological deficits. Extremities: leg edema present.  Skin: No rashes, lesions or ulcers Psychiatry:  Mood & affect appropriate.     Data Reviewed: I have personally reviewed following labs and imaging studies  CBC: Recent Labs  Lab 01/05/21 1418 01/05/21 2200 01/06/21 0408  WBC 7.7  --  7.8  NEUTROABS 5.3  --   --   HGB 14.8 15.3 14.6  HCT 45.7 45.0 45.4  MCV 89.3  --  89.0  PLT 187  --  0000000    Basic Metabolic Panel: Recent Labs  Lab 01/05/21 1418 01/05/21 2200 01/06/21 0408  NA 142 144 141  K 3.6 3.8 3.3*  CL 100  --  95*  CO2 33*  --  37*  GLUCOSE 100*  --  113*  BUN 25*  --  20  CREATININE 1.56*  --  1.53*  CALCIUM 9.2  --  9.3  MG  --   --  2.1  PHOS  --   --  4.1    GFR: Estimated Creatinine Clearance: 85 mL/min (A) (by C-G formula based on SCr of 1.53 mg/dL (H)).  Liver Function Tests: Recent Labs  Lab 01/05/21 1418  AST 19  ALT 16  ALKPHOS 39  BILITOT 0.9  PROT 7.7  ALBUMIN 3.7    CBG: No results for input(s): GLUCAP in the last 168 hours.   Recent Results (from the past 240 hour(s))   SARS CORONAVIRUS 2 (TAT 6-24 HRS) Nasopharyngeal Nasopharyngeal Swab     Status: None   Collection Time: 01/06/21  1:05 AM   Specimen: Nasopharyngeal Swab  Result Value Ref Range Status   SARS Coronavirus 2 NEGATIVE NEGATIVE Final    Comment: (NOTE) SARS-CoV-2 target nucleic acids are NOT DETECTED.  The SARS-CoV-2 RNA is generally detectable in upper and lower respiratory specimens during the acute phase of infection. Negative results do not preclude SARS-CoV-2 infection, do not rule out co-infections with other pathogens, and should not be used as the sole basis for treatment or other patient management decisions. Negative results must be combined with clinical observations, patient history, and epidemiological information. The expected result is Negative.  Fact Sheet for Patients: SugarRoll.be  Fact Sheet for Healthcare Providers: https://www.woods-mathews.com/  This test is not yet approved or cleared by the [place] FDA and  has been authorized for detection and/or diagnosis of SARS-CoV-2 by FDA under an Emergency Use Authorization (EUA). This EUA will remain  in effect (meaning this test can be used) for the duration of the COVID-19 declaration under Se ction 564(b)(1) of the Act, 21 U.S.C. section 360bbb-3(b)(1), unless the authorization is terminated or revoked sooner.  Performed at [facility], [place] [address], [place], [place] [address]          Radiology Studies: DG Chest 2 View  Result Date: 01/05/2021 CLINICAL DATA:  Shortness of breath EXAM: CHEST - 2 VIEW COMPARISON:  05/03/2019 FINDINGS: Stable cardiomegaly. Mild pulmonary vascular congestion. Increased interstitial markings within the perihilar and bibasilar regions. No pleural effusion or pneumothorax. IMPRESSION: Findings suggest CHF with mild interstitial edema. Electronically Signed   By: Davina Poke D.O.   On: 01/05/2021 15:13         Scheduled Meds: . amLODipine  5 mg Oral Daily  . aspirin  81 mg Oral Daily  . carvedilol  12.5 mg Oral BID WC  . enoxaparin (LOVENOX) injection  90 mg Subcutaneous Q24H  . furosemide  40 mg Intravenous Daily  . potassium chloride  20 mEq Oral Daily  . potassium chloride  40 mEq Oral Once  . rosuvastatin  10 mg Oral Daily  . torsemide  40 mg Oral Daily   Continuous Infusions:   LOS: 1 day        Hosie Poisson, MD [facility]   To contact the attending provider between 7A-7P or the covering provider during after hours 7P-7A, please log into the web site [email or web address] and access using universal [place] password for that web site. If you do not have the password, please call the hospital operator.  01/06/2021, 8:05 AM

## 2021-01-07 ENCOUNTER — Inpatient Hospital Stay (HOSPITAL_COMMUNITY): Payer: Medicare Other

## 2021-01-07 DIAGNOSIS — N183 Chronic kidney disease, stage 3 unspecified: Secondary | ICD-10-CM

## 2021-01-07 DIAGNOSIS — N179 Acute kidney failure, unspecified: Secondary | ICD-10-CM

## 2021-01-07 DIAGNOSIS — E785 Hyperlipidemia, unspecified: Secondary | ICD-10-CM

## 2021-01-07 DIAGNOSIS — I5033 Acute on chronic diastolic (congestive) heart failure: Secondary | ICD-10-CM

## 2021-01-07 DIAGNOSIS — E876 Hypokalemia: Secondary | ICD-10-CM

## 2021-01-07 LAB — BASIC METABOLIC PANEL
Anion gap: 10 (ref 5–15)
BUN: 26 mg/dL — ABNORMAL HIGH (ref 6–20)
CO2: 36 mmol/L — ABNORMAL HIGH (ref 22–32)
Calcium: 9.5 mg/dL (ref 8.9–10.3)
Chloride: 94 mmol/L — ABNORMAL LOW (ref 98–111)
Creatinine, Ser: 1.84 mg/dL — ABNORMAL HIGH (ref 0.61–1.24)
GFR, Estimated: 41 mL/min — ABNORMAL LOW (ref >60)
Glucose, Bld: 98 mg/dL (ref 70–99)
Potassium: 3.3 mmol/L — ABNORMAL LOW (ref 3.5–5.1)
Sodium: 140 mmol/L (ref 135–145)

## 2021-01-07 LAB — MAGNESIUM: Magnesium: 2.2 mg/dL (ref 1.7–2.4)

## 2021-01-07 MED ORDER — POTASSIUM CHLORIDE CRYS ER 20 MEQ PO TBCR
40.0000 meq | EXTENDED_RELEASE_TABLET | Freq: Once | ORAL | Status: AC
  Administered 2021-01-07: 40 meq via ORAL
  Filled 2021-01-07: qty 2

## 2021-01-07 MED ORDER — TECHNETIUM TO 99M ALBUMIN AGGREGATED: 4.4000 | Freq: Once | INTRAVENOUS | Status: DC | PRN

## 2021-01-07 NOTE — Progress Notes (Signed)
PROGRESS NOTE    Aaron Hale  [id number] DOB: [birth year] DOA: 01/05/2021 PCP: Aaron Hale    Chief Complaint  Patient presents with  . Leg Swelling    Brief Narrative: 60 year old gentleman with prior history of severe morbid obesity, obstructive sleep apnea, stage IIIb CKD, chronic diastolic heart failure presents from nephrologist office for hypoxia, weight gain and shortness of breath.  Patient also reports orthopnea.  Chest x-ray on admission shows mild pulmonary edema.  He was started on nasal cannula oxygen and admitted for evaluation of acute CHF. Pt seen and examined, he remains fluid overloaded. Has bilateral leg edema has only 1.2 to 1.4 lit /diuresis in the last 24 hours.   Assessment & Plan:   Active Problems:   Acute exacerbation of CHF (congestive heart failure) (HCC)   Acute on chronic diastolic heart failure Evidence of hypoxemia, orthopnea,  dyspnea on exertion bilateral lower extremity edema and chest x-ray showing pulmonary edema. Repeat echocardiogram showed preserved LVef with diastolic dysfunction.   Started the patient on IV Lasix 40 twice daily increased to 60 mg BID.  , monitor strict intake and output and daily weights, low-sodium diet. Continue with Coreg.  Hold Norvasc to allow room for blood pressure. Therapy evaluations recommending home healthPT and hypoxia on 2 lit of [place] oxygen.  Will get V/Q eval for evaluation of PE.    Stage IIIb CKD Creatinine at baseline is around 1.5, slight increase in creatinine to 1.8.  Continue to monitor.    Hypokalemia Replaced.  Hyperlipidemia Continue with Crestor.   Prolonged QTC Recheck twelve-lead EKG. Magnesium is greater than 2, replace potassium keep it greater than 4.    Morbid obesity Follow-up with PCP for weight loss and lifestyle modification.  Bilateral lower extremity edema:  Venous duplex ordered for evaluation of DVT. But pt couldn't tolerate the exam .    DVT  prophylaxis: Lovenox Code Status: Full code Family Communication: (None at bedside  disposition:   Status is: Inpatient  Remains inpatient appropriate because:IV treatments appropriate due to intensity of illness or inability to take PO   Dispo:  Patient From: Home  Planned Disposition: Home  Medically stable for discharge: No         Consultants:   None  Procedures: None Antimicrobials: None  Subjective: Pt reports breathing better, no chest pain .   Objective: Vitals:   01/06/21 1705 01/06/21 2100 01/07/21 0500 01/07/21 0819  BP: 130/70 (!) 129/91 127/80 126/84  Pulse: 76  77 95  Resp:  '20 18 18  '$ Temp:  98.5 F (36.9 C) 98.4 F (36.9 C) 98.3 F (36.8 C)  TempSrc:  Oral Tympanic Oral  SpO2:   92% 96%  Weight:   (!) 178.9 kg   Height:        Intake/Output Summary (Last 24 hours) at 01/07/2021 1454 Last data filed at 01/07/2021 0819 Gross per 24 hour  Intake 660 ml  Output 900 ml  Net -240 ml   Filed Weights   01/05/21 2353 01/06/21 0520 01/07/21 0500  Weight: (!) 179.4 kg (!) 179.5 kg (!) 178.9 kg    Examination:  General exam: Alert and comfortable, on 2 L of nasal cannula oxygen. Respiratory system: Diminished air entry at bases, no wheezing or rhonchi Cardiovascular system: S1-S2 heard, regular rate rhythm, pedal edema 3+ Gastrointestinal system: Abdomen is soft, nontender, bowel sounds normal  Central nervous system: Alert and oriented, grossly nonfocal Extremities: 3+ leg edema present Skin: No rashes seen Psychiatry:  Mood is appropriate    Data Reviewed: I have personally reviewed following labs and imaging studies  CBC: Recent Labs  Lab 01/05/21 1418 01/05/21 2200 01/06/21 0408  WBC 7.7  --  7.8  NEUTROABS 5.3  --   --   HGB 14.8 15.3 14.6  HCT 45.7 45.0 45.4  MCV 89.3  --  89.0  PLT 187  --  0000000    Basic Metabolic Panel: Recent Labs  Lab 01/05/21 1418 01/05/21 2200 01/06/21 0408 01/07/21 0230  NA 142 144 141 140  K 3.6  3.8 3.3* 3.3*  CL 100  --  95* 94*  CO2 33*  --  37* 36*  GLUCOSE 100*  --  113* 98  BUN 25*  --  20 26*  CREATININE 1.56*  --  1.53* 1.84*  CALCIUM 9.2  --  9.3 9.5  MG  --   --  2.1  --   PHOS  --   --  4.1  --     GFR: Estimated Creatinine Clearance: 70.5 mL/min (A) (by C-G formula based on SCr of 1.84 mg/dL (H)).  Liver Function Tests: Recent Labs  Lab 01/05/21 1418  AST 19  ALT 16  ALKPHOS 39  BILITOT 0.9  PROT 7.7  ALBUMIN 3.7    CBG: No results for input(s): GLUCAP in the last 168 hours.   Recent Results (from the past 240 hour(s))  SARS CORONAVIRUS 2 (TAT 6-24 HRS) Nasopharyngeal Nasopharyngeal Swab     Status: None   Collection Time: 01/06/21  1:05 AM   Specimen: Nasopharyngeal Swab  Result Value Ref Range Status   SARS Coronavirus 2 NEGATIVE NEGATIVE Final    Comment: (NOTE) SARS-CoV-2 target nucleic acids are NOT DETECTED.  The SARS-CoV-2 RNA is generally detectable in upper and lower respiratory specimens during the acute phase of infection. Negative results do not preclude SARS-CoV-2 infection, do not rule out co-infections with other pathogens, and should not be used as the sole basis for treatment or other patient management decisions. Negative results must be combined with clinical observations, patient history, and epidemiological information. The expected result is Negative.  Fact Sheet for Patients: SugarRoll.be  Fact Sheet for Healthcare Providers: https://www.woods-mathews.com/  This test is not yet approved or cleared by the [place] FDA and  has been authorized for detection and/or diagnosis of SARS-CoV-2 by FDA under an Emergency Use Authorization (EUA). This EUA will remain  in effect (meaning this test can be used) for the duration of the COVID-19 declaration under Se ction 564(b)(1) of the Act, 21 U.S.C. section 360bbb-3(b)(1), unless the authorization is terminated or revoked  sooner.  Performed at [facility], [place] [address] [place], [place] [address]          Radiology Studies: ECHOCARDIOGRAM COMPLETE  Result Date: 01/06/2021    ECHOCARDIOGRAM REPORT   Patient Name:   Aaron Hale Date of Exam: 01/06/2021 Medical Rec #:  [id number]   Height:       71.0 in Accession #:    [id number]  Weight:       395.7 lb Date of Birth:  04-Sep-1961  BSA:          2.818 m Patient Age:    59 years    BP:           120/79 mmHg Patient Gender: M           HR:           78 bpm. Exam  Location:  Inpatient Procedure: 2D Echo and Intracardiac Opacification Agent Indications:    Congestive Heart Failure  History:        Patient has prior history of Echocardiogram examinations, most                 recent 05/04/2019. CHF; Risk Factors:Hypertension.  Sonographer:    Mikki Santee RDCS (AE) Referring Phys: [id number] [facility]  Sonographer Comments: Suboptimal parasternal window, suboptimal apical window, suboptimal subcostal window, Technically difficult study due to poor echo windows, patient is morbidly obese and Technically challenging study due to limited acoustic windows.  Image acquisition challenging due to patient body habitus. IMPRESSIONS  1. Left ventricular ejection fraction, by estimation, is 55 to 60%. The left ventricle has normal function. Left ventricular endocardial border not optimally defined to evaluate regional wall motion. There is not well visualized left ventricular hypertrophy. Left ventricular diastolic parameters are indeterminate. Very technically difficult study with globally preserved LV function.  2. Right ventricular systolic function was not well visualized. The right ventricular size is not well visualized.  3. The mitral valve was not well visualized. No evidence of mitral valve regurgitation.  4. The aortic valve was not well visualized. Aortic valve regurgitation is not visualized.  5. The inferior vena cava is dilated in size with <50% respiratory  variability, suggesting right atrial pressure of 15 mmHg. Comparison(s): A prior study was performed on 05/03/21. Prior images reviewed side by side. Prior study also very limited but LV function appears slightly lower from prior. FINDINGS  Left Ventricle: Left ventricular ejection fraction, by estimation, is 55 to 60%. The left ventricle has normal function. Left ventricular endocardial border not optimally defined to evaluate regional wall motion. The left ventricular internal cavity size was normal in size. There is not well visualized left ventricular hypertrophy. Left ventricular diastolic parameters are indeterminate. Right Ventricle: The right ventricular size is not well visualized. Right vetricular wall thickness was not assessed. Right ventricular systolic function was not well visualized. Left Atrium: Left atrial size was not well visualized. Right Atrium: Right atrial size was not well visualized. Pericardium: Trivial pericardial effusion is present. Mitral Valve: The mitral valve was not well visualized. No evidence of mitral valve regurgitation. Tricuspid Valve: The tricuspid valve is not well visualized. Tricuspid valve regurgitation is not demonstrated. Aortic Valve: The aortic valve was not well visualized. Aortic valve regurgitation is not visualized. Pulmonic Valve: The pulmonic valve was not well visualized. Pulmonic valve regurgitation is not visualized. Aorta: The ascending aorta was not well visualized and the aortic root was not well visualized. Venous: The inferior vena cava is dilated in size with less than 50% respiratory variability, suggesting right atrial pressure of 15 mmHg. IAS/Shunts: The interatrial septum was not well visualized. Rudean Haskell Hale Electronically signed by Rudean Haskell Hale Signature Date/Time: 01/06/2021/2:56:28 PM    Final         Scheduled Meds: . aspirin  81 mg Oral Daily  . carvedilol  12.5 mg Oral BID WC  . enoxaparin (LOVENOX) injection  90  mg Subcutaneous Q24H  . furosemide  60 mg Intravenous BID  . rosuvastatin  10 mg Oral Daily   Continuous Infusions:   LOS: 2 days        Hosie Poisson, Hale [facility]   To contact the attending provider between 7A-7P or the covering provider during after hours 7P-7A, please log into the web site [email or web address] and access using universal [place] password for that web site.  If you do not have the password, please call the hospital operator.  01/07/2021, 2:54 PM

## 2021-01-07 NOTE — Consult Note (Addendum)
Cardiology Consultation:   Patient ID: Aaron Hale MRN: [id number]; DOB: [birth year]  Admit date: 01/05/2021 Date of Consult: 01/07/2021  PCP:  Aaron Nip, Hale   [facility] Providers Cardiologist:  Aaron Heinz, Hale   { =  Patient Profile:   Aaron Hale is a 60 y.o. male with a hx of chronic diastolic heart failure, HTN, HLD, CKD stage IV, OSA on BIPAP, obesity, who is being seen 01/07/2021 for the evaluation of CHF at the request of Aaron. Karleen Hale.   History of Present Illness:   Mr. Aaron Hale follows Aaron Hale since [id number] for diastolic heart failure.   Echo from 05/04/19 showed LV EF 60-65%, moderate increased LV wall thickness, diastolic dysfunction, no RWMA, RV normal, no significant valvular disease.   He suffered chronic hypoxic respiratory failure and was weaned off 24/7 oxygen 1 yr ago. This was felt multifactorial from diastolic heart failure, obesity hypoventilation syndrome, OSA.  He was recommended a sleep study on 11/18/2019 appointment.  He was later diagnosed with OSA and started BIPAP 1 month ago. He states he can't tolerate BIPAP and is only using 2LNC at this time at night.   For heart failure and hypertension, he had developed hyperkalemia in the setting of CKD while on spironolactone losartan for which both are discontinued.  He was last seen in office on 11/08/2020, currently remains on regimen including torsemide 40 mg daily, carvedilol 12.5 mg twice daily (unable to tolerating higher dose due to fatigue) and amlodipine 5 mg daily.   Patient presented to ER on 01/05/2021 shortness of breath with minimal exertion.  He had a routine visit with his nephrology, to be hypoxic with pulse ox 84% on room air.  He had reported weight gain of 30 pounds over the past month.  He was recommended coming to the ER for evaluation.  He also reports orthopnea, worsening of bilateral lower extremity edema.   Admission lab work including CMP revealed bicarb 33, BUN 25, creatinine  1.56, GFR 51.  CBC differential grossly unremarkable.  High sensitive troponin negative x1, BNP 31.  Chest x-ray suggest CHF with mild interstitial edema.  EKG at admission revealed sinus rhythm, first-degree AV block, ventricular rate 80 bpm, LAD, RBBB, PVCs, no acute ischemic change found.  Subsequent labs including VBG revealed pH 7.35.  Ionized calcium borderline low 1.13.  BMP revealed hypokalemia 3.3, hypochloremia 94, BUN 26, and creatinine 1.84 with GFR 41 on 01/07/2021.  Echo from 01/06/2021 revealed LVEF 55 to 60%, preserved LV function,  difficult to assess RWM, diastolic indeterminate, RV normal, no significant valvular disease. He was admitted to hospital medicine service, started on IV Lasix 60 mg twice daily.  Cardiology is consulted currently for further management of CHF.   During encounter, patient states he get winded with ambulation and activities, does not have resting SOB. He noted weight gain but unsure if it's necessarily 30 pounds. He noted his BLE are more swelling than usual for the past week. He has a small blister on his LLE. He is compliant with medication, currently on torsemide '40mg'$  daily, he felt it work better when he was taking '20mg'$  BID previously. He states he can never lay flat to sleep. He was weaned off oxygen 1 yr ago, suppose to use BIPAP overnight for OSA but could not tolerate it. He is only using 2LNC at night currently. He states he has been urinating a lot but output is not accurately recorded by staff. He denied any chest pain or pressure,  dizziness, syncope.    Past Medical History:  Diagnosis Date  . CHF (congestive heart failure) ([place])   . Chronic kidney disease   . Hypertension   . Morbid obesity ([place])   . Shortness of breath 03/30/2020  . Sleep apnea    Bipap    History reviewed. No pertinent surgical history.   Home Medications:  Prior to Admission medications   Medication Sig Start Date End Date Taking? Authorizing Provider  amLODipine (NORVASC) 5  MG tablet Take 1 tablet (5 mg total) by mouth daily. Patient taking differently: Take 5 mg by mouth in the morning. 08/11/20 11/09/20 Yes Aaron Hale, Doreene Burke, PA-C  aspirin 81 MG chewable tablet Chew 81 mg by mouth daily.   Yes Aaron Hale  carvedilol (COREG) 12.5 MG tablet Take 1 tablet (12.5 mg total) by mouth 2 (two) times daily with a meal. 08/11/20  Yes Aaron Hale, Aaron K, PA-C  ferrous sulfate 325 (65 FE) MG tablet Take 325 mg by mouth 2 (two) times daily with a meal.   Yes Aaron Hale  OXYGEN Inhale 2 L/min into the lungs as needed (for shortness of breath).   Yes Aaron Hale  rosuvastatin (CRESTOR) 10 MG tablet Take 1 tablet by mouth once daily Patient taking differently: Take 10 mg by mouth every evening. 11/09/20  Yes Aaron Heinz, Hale  torsemide (DEMADEX) 20 MG tablet Take 1 tablet by mouth once daily Patient taking differently: Take 40 mg by mouth daily in the afternoon. 11/22/20  Yes Aaron Heinz, Hale    Inpatient Medications: Scheduled Meds: . aspirin  81 mg Oral Daily  . carvedilol  12.5 mg Oral BID WC  . enoxaparin (LOVENOX) injection  90 mg Subcutaneous Q24H  . furosemide  60 mg Intravenous BID  . rosuvastatin  10 mg Oral Daily   Continuous Infusions:  PRN Meds: acetaminophen, melatonin, ondansetron (ZOFRAN) IV  Allergies:    Allergies  Allergen Reactions  . Spironolactone Other (See Comments)    Reaction not recalled    Social History:   Social History   Socioeconomic History  . Marital status: Single    Spouse name: Not on file  . Number of children: Not on file  . Years of education: Not on file  . Highest education level: Not on file  Occupational History  . Occupation: retired  Tobacco Use  . Smoking status: Never Smoker  . Smokeless tobacco: Never Used  Vaping Use  . Vaping Use: Never used  Substance and Sexual Activity  . Alcohol use: Not Currently  . Drug use: No  . Sexual activity: Not Currently   Other Topics Concern  . Not on file  Social History Narrative  . Not on file   Social Determinants of Health   Financial Resource Strain: Not on file  Food Insecurity: Not on file  Transportation Needs: Not on file  Physical Activity: Not on file  Stress: Not on file  Social Connections: Not on file  Intimate Partner Violence: Not on file    Family History:    Family History  Problem Relation Age of Onset  . CAD Mother      ROS:   Constitutional: Denied fever, chills, malaise, night sweats Eyes: Denied vision change or loss Ears/Nose/Mouth/Throat: Denied ear ache, sore throat, coughing, sinus pain Cardiovascular: see HPI  Respiratory: see HPI Gastrointestinal: Denied nausea, vomiting, abdominal pain, diarrhea Genital/Urinary: Denied dysuria, hematuria, urinary frequency/urgency Musculoskeletal: Denied muscle ache, joint pain, weakness Skin: Denied rash,  wound Neuro: Denied headache, dizziness, syncope Psych: Denied history of depression/anxiety  Endocrine: Denied history of diabetes   Physical Exam/Data:   Vitals:   01/06/21 1705 01/06/21 2100 01/07/21 0500 01/07/21 0819  BP: 130/70 (!) 129/91 127/80 126/84  Pulse: 76  77 95  Resp:  '20 18 18  '$ Temp:  98.5 F (36.9 C) 98.4 F (36.9 C) 98.3 F (36.8 C)  TempSrc:  Oral Tympanic Oral  SpO2:   92% 96%  Weight:   (!) 178.9 kg   Height:        Intake/Output Summary (Last 24 hours) at 01/07/2021 1400 Last data filed at 01/07/2021 0819 Gross per 24 hour  Intake 660 ml  Output 900 ml  Net -240 ml   Last 3 Weights 01/07/2021 01/06/2021 01/05/2021  Weight (lbs) 394 lb 4.8 oz 395 lb 11.2 oz 395 lb 8 oz  Weight (kg) 178.853 kg 179.488 kg 179.398 kg     Body mass index is 54.99 kg/m.   Vitals:  Vitals:   01/07/21 0500 01/07/21 0819  BP: 127/80 126/84  Pulse: 77 95  Resp: 18 18  Temp: 98.4 F (36.9 C) 98.3 F (36.8 C)  SpO2: 92% 96%   General Appearance: In no apparent distress, sitting in recliner,  obese HEENT: Normocephalic, atraumatic. EOMs intact. Oropharynx is dry.  Neck: Neck is short and thick, difficult exam for JVD, no evident JVD observed  Cardiovascular: Regular rate and rhythm, normal S1-S2,  no murmur/rub/gallop, heart sound was distant to ascultation  Respiratory: Resting breathing unlabored, lungs sounds clear but diminsihed to auscultation bilaterally, no use of accessory muscles. On 2LNC  No wheezes, rales or rhonchi.   Gastrointestinal: Bowel sounds positive, abdomen obese, soft, non-tender, non-distended.  Extremities: Able to move all extremities without difficulty, 2+ pitting edema BLE, pedal pulse + bilaterally  Genitourinary: Genital exam not performed Musculoskeletal: Normal muscle bulk and tone Skin: Intact, warm, dry. Venous stasis changed noted, bilateral lower leg dependent rubor, LLE small bulla noted not ruptured.  Neurologic: Alert, oriented to person, place and time. Fluent speech, no cognitive deficit, no gross focal neuro deficit Psychiatric: Normal affect. Mood is appropriate.    EKG:   The EKG was personally reviewed and demonstrates:  EKG at admission 01/05/21  revealed sinus rhythm, first-degree AV block, ventricular rate 80 bpm, LAD, RBBB, PVCs, no acute ischemic change noted.    Subsequent EKG repeat noted LBBB.   Telemetry:    Telemetry was personally reviewed and demonstrates:  Sinus rhythm with HR 70-90s, intermittent bigeminy and multifocal PVCs, nocturnal  sinus bradycardia with HR 40s with some pauses <3 second noted   Relevant CV Studies:   1. Left ventricular ejection fraction, by estimation, is 55 to 60%. The  left ventricle has normal function. Left ventricular endocardial border  not optimally defined to evaluate regional wall motion. There is not well  visualized left ventricular  hypertrophy. Left ventricular diastolic parameters are indeterminate. Very  technically difficult study with globally preserved LV function.  2. Right  ventricular systolic function was not well visualized. The right  ventricular size is not well visualized.  3. The mitral valve was not well visualized. No evidence of mitral valve  regurgitation.  4. The aortic valve was not well visualized. Aortic valve regurgitation  is not visualized.  5. The inferior vena cava is dilated in size with <50% respiratory  variability, suggesting right atrial pressure of 15 mmHg.   Comparison(s): A prior study was performed on 05/03/21. Prior images  reviewed side by side. Prior study also very limited but LV function  appears slightly lower from prior.    Echo from 05/04/19:  1. The left ventricle has normal systolic function with an ejection  fraction of 60-65%. The cavity size was normal. There is moderately  increased left ventricular wall thickness. Left ventricular diastolic  Doppler parameters are consistent with impaired  relaxation. No evidence of left ventricular regional wall motion  abnormalities.  2. The right ventricle has normal systolic function. The cavity was  normal. There is no increase in right ventricular wall thickness.  3. The mitral valve is grossly normal.  4. The tricuspid valve is grossly normal.  5. The aortic valve is tricuspid. Aortic valve regurgitation was not  assessed by color flow Doppler.  6. The aorta is normal unless otherwise noted.    Laboratory Data:  High Sensitivity Troponin:   Recent Labs  Lab 01/05/21 1418  TROPONINIHS 13     Chemistry Recent Labs  Lab 01/05/21 1418 01/05/21 2200 01/06/21 0408 01/07/21 0230  NA 142 144 141 140  Hale 3.6 3.8 3.3* 3.3*  CL 100  --  95* 94*  CO2 33*  --  37* 36*  GLUCOSE 100*  --  113* 98  BUN 25*  --  20 26*  CREATININE 1.56*  --  1.53* 1.84*  CALCIUM 9.2  --  9.3 9.5  GFRNONAA 51*  --  52* 41*  ANIONGAP 9  --  9 10    Recent Labs  Lab 01/05/21 1418  PROT 7.7  ALBUMIN 3.7  AST 19  ALT 16  ALKPHOS 39  BILITOT 0.9   Hematology Recent  Labs  Lab 01/05/21 1418 01/05/21 2200 01/06/21 0408  WBC 7.7  --  7.8  RBC 5.12  --  5.10  HGB 14.8 15.3 14.6  HCT 45.7 45.0 45.4  MCV 89.3  --  89.0  MCH 28.9  --  28.6  MCHC 32.4  --  32.2  RDW 14.1  --  14.1  PLT 187  --  175   BNP Recent Labs  Lab 01/05/21 1418  BNP 31.0    DDimer No results for input(s): DDIMER in the last 168 hours.   Radiology/Studies:  DG Chest 2 View  Result Date: 01/05/2021 CLINICAL DATA:  Shortness of breath EXAM: CHEST - 2 VIEW COMPARISON:  05/03/2019 FINDINGS: Stable cardiomegaly. Mild pulmonary vascular congestion. Increased interstitial markings within the perihilar and bibasilar regions. No pleural effusion or pneumothorax. IMPRESSION: Findings suggest CHF with mild interstitial edema. Electronically Signed   By: Davina Poke D.O.   On: 01/05/2021 15:13   ECHOCARDIOGRAM COMPLETE  Result Date: 01/06/2021    ECHOCARDIOGRAM REPORT   Patient Name:   Aaron Hale Date of Exam: 01/06/2021 Medical Rec #:  [id number]   Height:       71.0 in Accession #:    [id number]  Weight:       395.7 lb Date of Birth:  27-Sep-1960  BSA:          2.818 m Patient Age:    65 years    BP:           120/79 mmHg Patient Gender: M           HR:           78 bpm. Exam Location:  Inpatient Procedure: 2D Echo and Intracardiac Opacification Agent Indications:    Congestive Heart Failure  History:  Patient has prior history of Echocardiogram examinations, most                 recent 05/04/2019. CHF; Risk Factors:Hypertension.  Sonographer:    Mikki Santee RDCS (AE) Referring Phys: [id number] [facility]  Sonographer Comments: Suboptimal parasternal window, suboptimal apical window, suboptimal subcostal window, Technically difficult study due to poor echo windows, patient is morbidly obese and Technically challenging study due to limited acoustic windows.  Image acquisition challenging due to patient body habitus. IMPRESSIONS  1. Left ventricular ejection fraction, by estimation,  is 55 to 60%. The left ventricle has normal function. Left ventricular endocardial border not optimally defined to evaluate regional wall motion. There is not well visualized left ventricular hypertrophy. Left ventricular diastolic parameters are indeterminate. Very technically difficult study with globally preserved LV function.  2. Right ventricular systolic function was not well visualized. The right ventricular size is not well visualized.  3. The mitral valve was not well visualized. No evidence of mitral valve regurgitation.  4. The aortic valve was not well visualized. Aortic valve regurgitation is not visualized.  5. The inferior vena cava is dilated in size with <50% respiratory variability, suggesting right atrial pressure of 15 mmHg. Comparison(s): A prior study was performed on 05/03/21. Prior images reviewed side by side. Prior study also very limited but LV function appears slightly lower from prior. FINDINGS  Left Ventricle: Left ventricular ejection fraction, by estimation, is 55 to 60%. The left ventricle has normal function. Left ventricular endocardial border not optimally defined to evaluate regional wall motion. The left ventricular internal cavity size was normal in size. There is not well visualized left ventricular hypertrophy. Left ventricular diastolic parameters are indeterminate. Right Ventricle: The right ventricular size is not well visualized. Right vetricular wall thickness was not assessed. Right ventricular systolic function was not well visualized. Left Atrium: Left atrial size was not well visualized. Right Atrium: Right atrial size was not well visualized. Pericardium: Trivial pericardial effusion is present. Mitral Valve: The mitral valve was not well visualized. No evidence of mitral valve regurgitation. Tricuspid Valve: The tricuspid valve is not well visualized. Tricuspid valve regurgitation is not demonstrated. Aortic Valve: The aortic valve was not well visualized. Aortic  valve regurgitation is not visualized. Pulmonic Valve: The pulmonic valve was not well visualized. Pulmonic valve regurgitation is not visualized. Aorta: The ascending aorta was not well visualized and the aortic root was not well visualized. Venous: The inferior vena cava is dilated in size with less than 50% respiratory variability, suggesting right atrial pressure of 15 mmHg. IAS/Shunts: The interatrial septum was not well visualized. Rudean Haskell Hale Electronically signed by Rudean Haskell Hale Signature Date/Time: 01/06/2021/2:56:28 PM    Final      Assessment and Plan:   Acute on chronic diastolic heart failure -Presented with worsening shortness of breath, orthopnea, >30 pound weight gain in 1 month -Chest x-ray concerning for pulmonary edema  -BNP 31. Hs trop negativex1 -EKG without acute ischemic changes  - Echo 5/4 showed EF 55-60%, unremarkable changes comparing to 2020 Echo  - UOP 1.4L over the past 24 hours, weight is down from 402 to 394 pounds (weight was 384 on 11/08/20 office visit) - on torsemide '40mg'$  daily at home, currently on IV Lasix '60mg'$  BID, intake and output not accurate per patient,  as sCr rising, will continue current dosing per Hale recommendation  - please monitor strict intake and output, daily weight, daily electrolytes and renal index, fluid restriction <1.5L daily,  low Na diet -Continue medical therapy with carvedilol -Query other etiology for DOE include OSA, OHS,  consider workup for PE  - May consider outpatient ischemic workup if symptoms concerning, no known CAD  Alternating LBBB and RBBB - noctural pasues <3 seconds noted on telemetry, without symptoms  - EKG with alternating LBBB and RBBB on EKG serials noted  - follow up outpatient with cardiology   AKI on CKD stage III - Cr baseline around 1.5 over the past year  - Cr 1.53 POA, now 1.84 - clinical continue to be hypervolemic  - recommend continue current Lasix dose and monitor response to  diuresis  - Trend renal index daily - Recommend avoid Lovenox use in the setting of AKI, avoid nephrotoxic agent  Hypokalemia  - Hale 3.3 , replaced by primary team  - will check Mag level , replace as needed   Acute on chronic hypoxic respiratory failure - he was weaned off oxygen >1 yr ago, currently uses 2 L nasal cannula oxygen at night because intolerant with BIPAP - he is now on [facility] during the day time, multifactorial 2/2 CHF, OSA, OHS, try wean as tolerated   Hypertension -Blood pressure is controlled, continue carvedilol, ok with holding amlodipine in the setting of IV diuresis  Hyperlipidemia - will check lipid panel  - continue crestor   OSA - not complaint with BIPAP , only on 2LNC at night , discussed the importance of treated OSA  Morbid obesity - lifestyle discussed    Risk Assessment/Risk Scores:        New York Heart Association (NYHA) Functional Class NYHA Class III        For questions or updates, please contact [facility] Please consult [email or web address] for contact info under    Signed, Margie Billet, NP  01/07/2021 2:00 PM   Patient seen and examined with Margie Billet NP.  Agree as above, with the following exceptions and changes as noted below. Pt is a 60 yo male with sleep apnea, and acute on chronic diastolic HF. BNP within normal range, LVEF preserved, RV function is mildly decreased with mild enlargement, though challenging to see on echo. Gen: NAD, CV: RRR, no murmurs, Lungs: clear, Abd: soft, Extrem: Warm, 2+ edema, venous stasis changes. Neuro/Psych: alert and oriented x 3, normal mood and affect. All available labs, radiology testing, previous records reviewed.  We discussed etiologies of heart failure, suspect a combination of diastolic dysfunction, though likely the predominant mechanism is obstructive sleep apnea/OHS.  He has OSA but is not tolerating BiPAP and occasionally wears oxygen overnight.  When euvolemic we may want to consider right heart  catheterization.  I have requested the patient contact his pulmonologist upon discharge for reevaluation of sleep apnea and reestablishing with medical supply for repeat fitting and troubleshooting.  I think it is critical he treats his sleep apnea as this is likely the driver of his heart failure.  Patient continues to diurese with Lasix 40 mg IV twice daily, he thinks he is diuresing very well but some was not recorded.  He is strictly recording it now.  Renal function has worsened, we will attempt IV diuresis for 1 additional day and then can consider reevaluation in the setting of trends of renal function.  Recommend strict I's and O's.  Elouise Munroe, Hale 01/07/21 4:51 PM

## 2021-01-07 NOTE — Evaluation (Signed)
Physical Therapy Evaluation Patient Details Name: Aaron Hale MRN: [id number] DOB: 12-21-1960 Today's Date: 01/07/2021   History of Present Illness  pt is a 60 y/o male admitted 5/4 from his nephrologist office hypoxic with O2 sats at 84%, pt with 30 pound weight gain over a month witgh worsening SOB with little exertion due to Acute axacerbation of diastolic CHF.  PMHx, morbid obesity, HTN, CHF.  Clinical Impression  Pt admitted with/for SOB, weight gain with Acute diastolic CHF.  Pt at a min guard to supervision level.  Pt currently limited functionally due to the problems listed below.  (see problems list.)  Pt will benefit from PT to maximize function and safety to be able to get home safely with available assist.     Follow Up Recommendations Home health PT;Supervision/Assistance - 24 hour    Equipment Recommendations  None recommended by PT    Recommendations for Other Services       Precautions / Restrictions Precautions Precautions: Fall (minimal fall risk)      Mobility  Bed Mobility               General bed mobility comments: pt OOB in the recliner on arrival.    Transfers Overall transfer level: Needs assistance Equipment used: None Transfers: Sit to/from Stand Sit to Stand: Supervision         General transfer comment: use of UE's appropriately.  Mildly effortful, but safe.  Ambulation/Gait Ambulation/Gait assistance: Supervision Gait Distance (Feet): 200 Feet Assistive device:  (rolling the portable O2) Gait Pattern/deviations: Step-through pattern   Gait velocity interpretation: <1.8 ft/sec, indicate of risk for recurrent falls General Gait Details: steady though mildly antalgic appearing, need for 2 standing rests to recover with min/mod dyspnea.  On 2L [place], SpO2 maintained at lower 90's and HR in the 90's, but slow decline into the mide 80's in standing for rest, Coaching for more efficient breathing to regain sats in the 90's.  As he fatigued  and became more dyspneic, 2L did not allow pt to maintain >=90%.  Stairs            Wheelchair Mobility    Modified Rankin (Stroke Patients Only)       Balance Overall balance assessment: Needs assistance Sitting-balance support: No upper extremity supported;Feet supported Sitting balance-Leahy Scale: Fair (or better)     Standing balance support: No upper extremity supported Standing balance-Leahy Scale: Fair                               Pertinent Vitals/Pain Pain Assessment: Faces Faces Pain Scale: Hurts a little bit Pain Location: back Pain Descriptors / Indicators: Aching Pain Intervention(s): Monitored during session    Home Living Family/patient expects to be discharged to:: Private residence Living Arrangements: Spouse/significant other Available Help at Discharge: Family;Available PRN/intermittently Type of Home: Apartment Home Access: Level entry     Home Layout: One level Home Equipment: None      Prior Function Level of Independence: Independent         Comments: very sedentary, mostly in the home, retired "not by choice"  pt reports now on oxygen.     Hand Dominance        Extremity/Trunk Assessment   Upper Extremity Assessment Upper Extremity Assessment: Overall WFL for tasks assessed    Lower Extremity Assessment Lower Extremity Assessment: Overall WFL for tasks assessed (functional, but weaker overall than he should be, likely due to  inactivity.)    Cervical / Trunk Assessment Cervical / Trunk Assessment: Normal  Communication   Communication: No difficulties  Cognition Arousal/Alertness: Awake/alert Behavior During Therapy: WFL for tasks assessed/performed;Flat affect Overall Cognitive Status: Within Functional Limits for tasks assessed                                 General Comments: flat and appearing apathetic.  not a great historian of his PLOF/home situation.      General Comments  General comments (skin integrity, edema, etc.): At rest on 2L VSS, during gait on 2L, pt eventually can't keep sats on 90% on 2L.    Exercises     Assessment/Plan    PT Assessment Patient needs continued PT services  PT Problem List Decreased strength;Decreased activity tolerance;Decreased mobility;Cardiopulmonary status limiting activity       PT Treatment Interventions Gait training;Functional mobility training;Therapeutic activities;Patient/family education    PT Goals (Current goals can be found in the Care Plan section)  Acute Rehab PT Goals Patient Stated Goal: home independent when able PT Goal Formulation: With patient Time For Goal Achievement: 01/21/21 Potential to Achieve Goals: Good    Frequency Min 3X/week   Barriers to discharge        Co-evaluation               AM-PAC PT "6 Clicks" Mobility  Outcome Measure Help needed turning from your back to your side while in a flat bed without using bedrails?: A Little Help needed moving from lying on your back to sitting on the side of a flat bed without using bedrails?: A Little Help needed moving to and from a bed to a chair (including a wheelchair)?: A Little Help needed standing up from a chair using your arms (e.g., wheelchair or bedside chair)?: A Little Help needed to walk in hospital room?: A Little Help needed climbing 3-5 steps with a railing? : A Little 6 Click Score: 18    End of Session Equipment Utilized During Treatment: Oxygen Activity Tolerance: Patient tolerated treatment well;Patient limited by fatigue Patient left: in chair;with call bell/phone within reach Nurse Communication: Mobility status PT Visit Diagnosis: Other abnormalities of gait and mobility (R26.89);Difficulty in walking, not elsewhere classified (R26.2)    Time: [id number] PT Time Calculation (min) (ACUTE ONLY): 22 min   Charges:   PT Evaluation $PT Eval Moderate Complexity: 1 Mod          01/07/2021  Ginger Carne.,  PT Acute Rehabilitation Services [phone]  (pager) [phone]  (office)  Tessie Fass Emorie Mcfate 01/07/2021, 10:23 AM

## 2021-01-08 ENCOUNTER — Encounter (HOSPITAL_COMMUNITY): Payer: 59

## 2021-01-08 LAB — LIPID PANEL
Cholesterol: 130 mg/dL (ref 0–200)
HDL: 38 mg/dL — ABNORMAL LOW (ref >40)
LDL Cholesterol: 70 mg/dL (ref 0–99)
Total CHOL/HDL Ratio: 3.4 RATIO
Triglycerides: 111 mg/dL (ref <150)
VLDL: 22 mg/dL (ref 0–40)

## 2021-01-08 LAB — BASIC METABOLIC PANEL
Anion gap: 10 (ref 5–15)
BUN: 31 mg/dL — ABNORMAL HIGH (ref 6–20)
CO2: 37 mmol/L — ABNORMAL HIGH (ref 22–32)
Calcium: 9.5 mg/dL (ref 8.9–10.3)
Chloride: 93 mmol/L — ABNORMAL LOW (ref 98–111)
Creatinine, Ser: 1.91 mg/dL — ABNORMAL HIGH (ref 0.61–1.24)
GFR, Estimated: 40 mL/min — ABNORMAL LOW (ref >60)
Glucose, Bld: 96 mg/dL (ref 70–99)
Potassium: 4.1 mmol/L (ref 3.5–5.1)
Sodium: 140 mmol/L (ref 135–145)

## 2021-01-08 MED ORDER — FUROSEMIDE 10 MG/ML IJ SOLN
60.0000 mg | Freq: Every day | INTRAMUSCULAR | Status: DC
  Administered 2021-01-09 – 2021-01-11 (×3): 60 mg via INTRAVENOUS
  Filled 2021-01-08 (×3): qty 6

## 2021-01-08 NOTE — Progress Notes (Addendum)
Patient refused vascular study. Explained what it was and he stated he knew what it was and would take the chance of having a blood clot. The test is too painful and he does not want to take any pain medication. Notified vascular tech, Sharyn Lull and Dr. Karleen Hampshire.

## 2021-01-08 NOTE — Progress Notes (Signed)
Progress Note  Patient Name: Aaron Hale Date of Encounter: 01/08/2021  Primary Cardiologist: Donato Heinz, MD   Subjective   Feels about the same.  We discussed potential need for RHC given continued renal dysfunction while trying to diurese. He is frustrated by this news, and wants to discuss with this sister which we agreed is very appropriate.   Inpatient Medications    Scheduled Meds: . aspirin  81 mg Oral Daily  . carvedilol  12.5 mg Oral BID WC  . enoxaparin (LOVENOX) injection  90 mg Subcutaneous Q24H  . furosemide  60 mg Intravenous BID  . rosuvastatin  10 mg Oral Daily   Continuous Infusions:  PRN Meds: acetaminophen, melatonin, ondansetron (ZOFRAN) IV   Vital Signs    Vitals:   01/07/21 0500 01/07/21 0819 01/07/21 1630 01/08/21 0500  BP: 127/80 126/84 130/74 129/75  Pulse: 77 95 80 79  Resp: 18 18    Temp: 98.4 F (36.9 C) 98.3 F (36.8 C) 98 F (36.7 C) 98.2 F (36.8 C)  TempSrc: Tympanic Oral  Oral  SpO2: 92% 96% 91% 91%  Weight: (!) 178.9 kg   (!) 178.1 kg  Height:        Intake/Output Summary (Last 24 hours) at 01/08/2021 1227 Last data filed at 01/07/2021 2036 Gross per 24 hour  Intake 960 ml  Output 700 ml  Net 260 ml   Filed Weights   01/06/21 0520 01/07/21 0500 01/08/21 0500  Weight: (!) 179.5 kg (!) 178.9 kg (!) 178.1 kg    Telemetry    SR, lbbb- Personally Reviewed  ECG    No new - Personally Reviewed  Physical Exam   GEN: No acute distress.   Neck: cannot appreciate JVD Cardiac: regular rhythm, normal rate, no murmurs, rubs, or gallops.  Respiratory: Clear to auscultation bilaterally. GI: Soft, nontender, non-distended  MS: 3+ edema; No deformity. Venous stasis changes Neuro:  Nonfocal  Psych: Normal affect   Labs    Chemistry Recent Labs  Lab 01/05/21 1418 01/05/21 2200 01/06/21 0408 01/07/21 0230 01/08/21 0148  NA 142   < > 141 140 140  K 3.6   < > 3.3* 3.3* 4.1  CL 100  --  95* 94* 93*  CO2 33*   --  37* 36* 37*  GLUCOSE 100*  --  113* 98 96  BUN 25*  --  20 26* 31*  CREATININE 1.56*  --  1.53* 1.84* 1.91*  CALCIUM 9.2  --  9.3 9.5 9.5  PROT 7.7  --   --   --   --   ALBUMIN 3.7  --   --   --   --   AST 19  --   --   --   --   ALT 16  --   --   --   --   ALKPHOS 39  --   --   --   --   BILITOT 0.9  --   --   --   --   GFRNONAA 51*  --  52* 41* 40*  ANIONGAP 9  --  '9 10 10   '$ < > = values in this interval not displayed.     Hematology Recent Labs  Lab 01/05/21 1418 01/05/21 2200 01/06/21 0408  WBC 7.7  --  7.8  RBC 5.12  --  5.10  HGB 14.8 15.3 14.6  HCT 45.7 45.0 45.4  MCV 89.3  --  89.0  MCH 28.9  --  28.6  MCHC 32.4  --  32.2  RDW 14.1  --  14.1  PLT 187  --  175    Cardiac EnzymesNo results for input(s): TROPONINI in the last 168 hours. No results for input(s): TROPIPOC in the last 168 hours.   BNP Recent Labs  Lab 01/05/21 1418  BNP 31.0     DDimer No results for input(s): DDIMER in the last 168 hours.   Radiology    DG CHEST PORT 1 VIEW  Result Date: 01/07/2021 CLINICAL DATA:  Hypoxia EXAM: PORTABLE CHEST 1 VIEW COMPARISON:  01/05/2021 FINDINGS: Cardiac shadow is enlarged. Aortic calcifications are again noted. The lungs are well aerated bilaterally. Previously seen vascular congestion and interstitial edema has improved in the interval. No new focal infiltrate is seen. No bony abnormality is noted. IMPRESSION: Improved CHF when compare with the prior study. Electronically Signed   By: Inez Catalina M.D.   On: 01/07/2021 20:52    Cardiac Studies   Echo   1. Left ventricular ejection fraction, by estimation, is 55 to 60%. The  left ventricle has normal function. Left ventricular endocardial border  not optimally defined to evaluate regional wall motion. There is not well  visualized left ventricular  hypertrophy. Left ventricular diastolic parameters are indeterminate. Very  technically difficult study with globally preserved LV function.   2. Right  ventricular systolic function was not well visualized. The right  ventricular size is not well visualized.   3. The mitral valve was not well visualized. No evidence of mitral valve  regurgitation.   4. The aortic valve was not well visualized. Aortic valve regurgitation  is not visualized.   5. The inferior vena cava is dilated in size with <50% respiratory  variability, suggesting right atrial pressure of 15 mmHg.     Patient Profile     60 y.o. male hx of chronic diastolic heart failure, HTN, HLD, CKD stage IV, OSA on BIPAP, obesity, who is being seen 01/07/2021 for the evaluation of CHF at the request of Dr. Karleen Hampshire.    Assessment & Plan   Active Problems:   Acute exacerbation of CHF (congestive heart failure) (HCC)   Acute on chronic diastolic heart failure -Presented with worsening shortness of breath, orthopnea, >30 pound weight gain in 1 month -Chest x-ray concerning for pulmonary edema  -BNP 31. Hs trop negativex1 -EKG without acute ischemic changes  - Echo 5/4 showed EF 55-60%, unremarkable changes comparing to 2020 Echo  - UOP 1.4L over the past 24 hours, weight is down from 402>> 394>>391 pounds (weight was 384 on 11/08/20 office visit) - on torsemide '40mg'$  daily at home, currently on IV Lasix '60mg'$  BID, intake and output not accurate per patient,  as sCr rising - Cr 1.9 today, will hold evening lasix dose and give lasix 60 mg IV tomorrow morning. - likely needs RHC this admission if cr continues to trend upward. He is not ready to consent, will discuss again tomorrow.   Alternating LBBB and RBBB - noctural pasues <3 seconds noted on telemetry, without symptoms  - EKG with suspected alternating LBBB and RBBB (noted on 01/05/21 ecgs)  - follow up outpatient with cardiology- will likely need home going monitor.    AKI on CKD stage III - Cr baseline around 1.5 over the past year  - Cr 1.53 POA, now 1.84>>1.9 - clinical continue to be hypervolemic  - hold lasix this evening,  resume tomorrow and will consider RHC   Hypokalemia  - K  4.1 , replaced by primary team  - Mag level nl, replace as needed    Acute on chronic hypoxic respiratory failure - he was weaned off oxygen >1 yr ago, currently uses 2 L nasal cannula oxygen at night because intolerant with BIPAP - he is now on [facility] during the day time, multifactorial 2/2 CHF, OSA, OHS, try wean as tolerated  - suspect the driver in his HF symptoms is OSA/OHS.   Hypertension -Blood pressure is controlled, continue carvedilol, ok with holding amlodipine in the setting of IV diuresis   Hyperlipidemia - ldl 70 - continue crestor    OSA - not complaint with BIPAP , only on 2LNC at night , discussed the importance of treated OSA   Morbid obesity - lifestyle discussed        For questions or updates, please contact [facility] Please consult [email or web address] for contact info under        Signed, Elouise Munroe, MD  01/08/2021, 12:27 PM

## 2021-01-08 NOTE — Progress Notes (Signed)
Attempted lower extremity venous duplex, patient refused test for the second time. Will d/c order.  01/08/2021 11:49 AM Kelby Aline., MHA, RVT, RDCS, RDMS

## 2021-01-08 NOTE — Progress Notes (Signed)
Patient refused the use of BIPAP for the evening.  

## 2021-01-08 NOTE — Progress Notes (Signed)
PROGRESS NOTE    Aaron Hale  [id number] DOB: [birth year] DOA: 01/05/2021 PCP: Aretta Nip, MD    Chief Complaint  Patient presents with  . Leg Swelling    Brief Narrative: 60 year old gentleman with prior history of severe morbid obesity, obstructive sleep apnea, stage IIIb CKD, chronic diastolic heart failure presents from nephrologist office for hypoxia, weight gain and shortness of breath.  Patient also reports orthopnea.  Chest x-ray on admission shows mild pulmonary edema.  He was started on nasal cannula oxygen and admitted for evaluation of acute CHF. Pt seen and examined at bedside, he remains on 2 lit of [place] oxygen and fluid overloaded.  Pt not using BIPAP at night.   Assessment & Plan:   Active Problems:   Acute exacerbation of CHF (congestive heart failure) (HCC)   Acute on chronic diastolic heart failure  Evidence of hypoxemia, orthopnea,  dyspnea on exertion bilateral lower extremity edema and chest x-ray showing pulmonary edema. Repeat echocardiogram showed preserved LVef with diastolic dysfunction.  Started the patient on IV lasix BID, but the evening dose of the lasix is held as his renal parameters are worsening. Cardiology on board and recommendations given. Plan for RHC.  , monitor strict intake and output and daily weights, low-sodium diet. Continue with Coreg.  Hold Norvasc to allow room for blood pressure. Therapy evaluations recommending home healthPT and hypoxia on 2 lit of [place] oxygen.  Will get V/Q eval for evaluation of PE.  CXR shows improvement in CHF.    Stage IIIb CKD Creatinine at baseline is around 1.5, Creatinine worsened to 1.9.  Continue to monitor.    Hypokalemia Replaced.repeat level wnl.   Hyperlipidemia Continue with Crestor.   Prolonged QTC Recheck twelve-lead EKG. Magnesium is greater than 2, replace potassium keep it greater than 4.    Morbid obesity Follow-up with PCP for weight loss and lifestyle  modification.  Bilateral lower extremity edema:  Venous duplex ordered for evaluation of DVT. But pt couldn't tolerate the exam .    OSA;  Pt on BIPAP at home but hasn't started it.  BIIPAP ordered here , but patient is refusing to use it.    DVT prophylaxis: Lovenox Code Status: Full code Family Communication: (None at bedside  disposition:   Status is: Inpatient  Remains inpatient appropriate because:IV treatments appropriate due to intensity of illness or inability to take PO   Dispo:  Patient From: Home  Planned Disposition: Home  Medically stable for discharge: No         Consultants:   Cardiology.   Procedures: None Antimicrobials: None  Subjective: He appears fluid overloaded.  No chest pain.   Objective: Vitals:   01/07/21 0500 01/07/21 0819 01/07/21 1630 01/08/21 0500  BP: 127/80 126/84 130/74 129/75  Pulse: 77 95 80 79  Resp: 18 18    Temp: 98.4 F (36.9 C) 98.3 F (36.8 C) 98 F (36.7 C) 98.2 F (36.8 C)  TempSrc: Tympanic Oral  Oral  SpO2: 92% 96% 91% 91%  Weight: (!) 178.9 kg   (!) 178.1 kg  Height:        Intake/Output Summary (Last 24 hours) at 01/08/2021 1325 Last data filed at 01/07/2021 2036 Gross per 24 hour  Intake 720 ml  Output 700 ml  Net 20 ml   Filed Weights   01/06/21 0520 01/07/21 0500 01/08/21 0500  Weight: (!) 179.5 kg (!) 178.9 kg (!) 178.1 kg    Examination:  General exam:alert, and mild distres son  2 LIT of [place] oxygen.  Respiratory system: air entry fair , no wheezing or rhonchi.  Cardiovascular system: S1S2, heard, RRR, pedal edema present.  Gastrointestinal system: Abdomen is soft, non tender non distended bowel sounds wnl.  Central nervous system: Alert and oriented, non focal.  Extremities: 3+ leg edema present Skin: No rashes seen Psychiatry: Mood is appropriate    Data Reviewed: I have personally reviewed following labs and imaging studies  CBC: Recent Labs  Lab 01/05/21 1418 01/05/21 2200  01/06/21 0408  WBC 7.7  --  7.8  NEUTROABS 5.3  --   --   HGB 14.8 15.3 14.6  HCT 45.7 45.0 45.4  MCV 89.3  --  89.0  PLT 187  --  0000000    Basic Metabolic Panel: Recent Labs  Lab 01/05/21 1418 01/05/21 2200 01/06/21 0408 01/07/21 0230 01/08/21 0148  NA 142 144 141 140 140  K 3.6 3.8 3.3* 3.3* 4.1  CL 100  --  95* 94* 93*  CO2 33*  --  37* 36* 37*  GLUCOSE 100*  --  113* 98 96  BUN 25*  --  20 26* 31*  CREATININE 1.56*  --  1.53* 1.84* 1.91*  CALCIUM 9.2  --  9.3 9.5 9.5  MG  --   --  2.1 2.2  --   PHOS  --   --  4.1  --   --     GFR: Estimated Creatinine Clearance: 67.7 mL/min (A) (by C-G formula based on SCr of 1.91 mg/dL (H)).  Liver Function Tests: Recent Labs  Lab 01/05/21 1418  AST 19  ALT 16  ALKPHOS 39  BILITOT 0.9  PROT 7.7  ALBUMIN 3.7    CBG: No results for input(s): GLUCAP in the last 168 hours.   Recent Results (from the past 240 hour(s))  SARS CORONAVIRUS 2 (TAT 6-24 HRS) Nasopharyngeal Nasopharyngeal Swab     Status: None   Collection Time: 01/06/21  1:05 AM   Specimen: Nasopharyngeal Swab  Result Value Ref Range Status   SARS Coronavirus 2 NEGATIVE NEGATIVE Final    Comment: (NOTE) SARS-CoV-2 target nucleic acids are NOT DETECTED.  The SARS-CoV-2 RNA is generally detectable in upper and lower respiratory specimens during the acute phase of infection. Negative results do not preclude SARS-CoV-2 infection, do not rule out co-infections with other pathogens, and should not be used as the sole basis for treatment or other patient management decisions. Negative results must be combined with clinical observations, patient history, and epidemiological information. The expected result is Negative.  Fact Sheet for Patients: SugarRoll.be  Fact Sheet for Healthcare Providers: https://www.woods-mathews.com/  This test is not yet approved or cleared by the [place] FDA and  has been authorized for  detection and/or diagnosis of SARS-CoV-2 by FDA under an Emergency Use Authorization (EUA). This EUA will remain  in effect (meaning this test can be used) for the duration of the COVID-19 declaration under Se ction 564(b)(1) of the Act, 21 U.S.C. section 360bbb-3(b)(1), unless the authorization is terminated or revoked sooner.  Performed at [facility], [place] [address] [place], [place] [address]          Radiology Studies: DG CHEST PORT 1 VIEW  Result Date: 01/07/2021 CLINICAL DATA:  Hypoxia EXAM: PORTABLE CHEST 1 VIEW COMPARISON:  01/05/2021 FINDINGS: Cardiac shadow is enlarged. Aortic calcifications are again noted. The lungs are well aerated bilaterally. Previously seen vascular congestion and interstitial edema has improved in the interval. No new  focal infiltrate is seen. No bony abnormality is noted. IMPRESSION: Improved CHF when compare with the prior study. Electronically Signed   By: Inez Catalina M.D.   On: 01/07/2021 20:52        Scheduled Meds: . aspirin  81 mg Oral Daily  . carvedilol  12.5 mg Oral BID WC  . enoxaparin (LOVENOX) injection  90 mg Subcutaneous Q24H  . [START ON 01/09/2021] furosemide  60 mg Intravenous Daily  . rosuvastatin  10 mg Oral Daily   Continuous Infusions:   LOS: 3 days        Hosie Poisson, MD [facility]   To contact the attending provider between 7A-7P or the covering provider during after hours 7P-7A, please log into the web site [email or web address] and access using universal [place] password for that web site. If you do not have the password, please call the hospital operator.  01/08/2021, 1:25 PM

## 2021-01-08 NOTE — Progress Notes (Addendum)
Pt was taken to nuclear medicine for V/Q scan but test was not completed due to pt unable to tolerate supine position.

## 2021-01-09 DIAGNOSIS — E785 Hyperlipidemia, unspecified: Secondary | ICD-10-CM | POA: Diagnosis present

## 2021-01-09 DIAGNOSIS — G4733 Obstructive sleep apnea (adult) (pediatric): Secondary | ICD-10-CM | POA: Diagnosis present

## 2021-01-09 LAB — BASIC METABOLIC PANEL
Anion gap: 7 (ref 5–15)
BUN: 33 mg/dL — ABNORMAL HIGH (ref 6–20)
CO2: 37 mmol/L — ABNORMAL HIGH (ref 22–32)
Calcium: 9.1 mg/dL (ref 8.9–10.3)
Chloride: 95 mmol/L — ABNORMAL LOW (ref 98–111)
Creatinine, Ser: 1.81 mg/dL — ABNORMAL HIGH (ref 0.61–1.24)
GFR, Estimated: 42 mL/min — ABNORMAL LOW (ref >60)
Glucose, Bld: 97 mg/dL (ref 70–99)
Potassium: 3.3 mmol/L — ABNORMAL LOW (ref 3.5–5.1)
Sodium: 139 mmol/L (ref 135–145)

## 2021-01-09 MED ORDER — SODIUM CHLORIDE 0.9 % IV SOLN: 250.0000 mL | INTRAVENOUS | Status: DC | PRN

## 2021-01-09 MED ORDER — POTASSIUM CHLORIDE 20 MEQ PO PACK
40.0000 meq | PACK | Freq: Once | ORAL | Status: AC
  Administered 2021-01-09: 40 meq via ORAL
  Filled 2021-01-09: qty 2

## 2021-01-09 MED ORDER — POTASSIUM CHLORIDE CRYS ER 20 MEQ PO TBCR
40.0000 meq | EXTENDED_RELEASE_TABLET | Freq: Two times a day (BID) | ORAL | Status: DC
  Administered 2021-01-09: 40 meq via ORAL
  Filled 2021-01-09: qty 2

## 2021-01-09 MED ORDER — SODIUM CHLORIDE 0.9% FLUSH
3.0000 mL | Freq: Two times a day (BID) | INTRAVENOUS | Status: DC
  Administered 2021-01-09 – 2021-01-10 (×3): 3 mL via INTRAVENOUS

## 2021-01-09 MED ORDER — SODIUM CHLORIDE 0.9 % IV SOLN: INTRAVENOUS | Status: DC

## 2021-01-09 MED ORDER — POTASSIUM CHLORIDE CRYS ER 20 MEQ PO TBCR: 40.0000 meq | EXTENDED_RELEASE_TABLET | Freq: Once | ORAL | Status: DC

## 2021-01-09 MED ORDER — CEPHALEXIN 500 MG PO CAPS
500.0000 mg | ORAL_CAPSULE | Freq: Four times a day (QID) | ORAL | Status: DC
  Administered 2021-01-09 – 2021-01-13 (×17): 500 mg via ORAL
  Filled 2021-01-09 (×17): qty 1

## 2021-01-09 MED ORDER — SODIUM CHLORIDE 0.9% FLUSH: 3.0000 mL | INTRAVENOUS | Status: DC | PRN

## 2021-01-09 NOTE — Progress Notes (Signed)
Placed patient on bipap for the night with IPAP set at 10cm and EPAP set at 5cm and oxygen set at 2lpm.

## 2021-01-09 NOTE — Progress Notes (Signed)
PROGRESS NOTE    Rachael Schickling  [id number] DOB: 10-15-1960 DOA: 01/05/2021 PCP: Aretta Nip, MD    Chief Complaint  Patient presents with  . Leg Swelling    Brief Narrative: 60 year old gentleman with prior history of severe morbid obesity, obstructive sleep apnea, stage IIIb CKD, chronic diastolic heart failure presents from nephrologist office for hypoxia, weight gain and shortness of breath.  Patient also reports orthopnea.  Chest x-ray on admission shows mild pulmonary edema.  He was started on nasal cannula oxygen and admitted for evaluation of acute CHF. Pt seen and examined at bedside. He remains fluid overloaded. He remains on oxygen. Rhe reports he tried the CPAP at night and could not tolerate.    Assessment & Plan:   Active Problems:   Chronic diastolic (congestive) heart failure (HCC)   Essential hypertension   Morbid obesity (HCC)   Acute exacerbation of CHF (congestive heart failure) (HCC)   Obstructive sleep apnea   Hyperlipidemia   Acute on chronic diastolic heart failure currently requiring 2 to 3 lit of [place] oxygen at rest and on ambulation.  Evidence of hypoxemia, orthopnea,  dyspnea on exertion bilateral lower extremity edema and chest x-ray showing pulmonary edema. Repeat echocardiogram showed preserved LVef with diastolic dysfunction.  Started the patient on IV lasix BID 60 mg . Diuresed about 3.3 lit overnight.  Cardiology on board and recommendations given. Plan for RHC in am.  Monitor strict intake and output and daily weights, low-sodium diet. Continue with Coreg.  Hold Norvasc to allow room for blood pressure. Therapy evaluations recommending home healthPT .  Will get V/Q eval for evaluation of PE - pending.  CXR shows improvement in CHF. He will probably need oxygen on discharge.    Acute on Stage IIIb CKD Slight worsening of creatinine probably from diuresis.  Continue to monitor.    Hypokalemia Replaced. Check magnesium levels.    Hyperlipidemia LDL is 70 Continue with Crestor.   Prolonged QTC Recheck twelve-lead EKG. Magnesium is greater than 2, replace potassium keep it greater than 4.    Morbid obesity Body mass index is 54.73 kg/m. Follow-up with PCP for weight loss and lifestyle modification.  Bilateral lower extremity edema:  Venous duplex ordered for evaluation of DVT. But pt couldn't tolerate the exam .    OSA;  Pt on BIPAP at home but hasn't started it.  BIIPAP ordered here .   Venous stasis Dermatitis of the lower extremities:  Elevated the legs. A small bleb on the left leg.  worsening erythema, tenderness of the lower extremities.  Will start him on doxycycline for possible mild cellulitis.   DVT prophylaxis: Lovenox Code Status: Full code Family Communication: None at bedside  disposition:   Status is: Inpatient  Remains inpatient appropriate because:Ongoing diagnostic testing needed not appropriate for outpatient work up and IV treatments appropriate due to intensity of illness or inability to take PO   Dispo:  Patient From: Home  Planned Disposition: Home with Health Care Svc  Medically stable for discharge: No         Consultants:   Cardiology.   Procedures: Echocardiogram.  Venous duplex of the lower extremities.  RHC scheduled for tomorrow.  Antimicrobials: None  Subjective: He appears fluid overloaded.  No chest pain.   Objective: Vitals:   01/08/21 1408 01/08/21 2054 01/09/21 0033 01/09/21 0456  BP: 111/63 126/75  121/74  Pulse:  72 81 60  Resp: '18 17 18 19  '$ Temp: 98.8 F (37.1 C) 99.3  F (37.4 C)  98.5 F (36.9 C)  TempSrc: Oral Oral  Oral  SpO2: 96% 91% 99% 93%  Weight:    (!) 178 kg  Height:        Intake/Output Summary (Last 24 hours) at 01/09/2021 1103 Last data filed at 01/09/2021 [id number] Gross per 24 hour  Intake 750 ml  Output 3300 ml  Net -2550 ml   Filed Weights   01/07/21 0500 01/08/21 0500 01/09/21 0456  Weight: (!) 178.9 kg  (!) 178.1 kg (!) 178 kg    Examination:  General exam: Morbidly obese gentleman on 2 L of nasal cannula oxygen does not appear to be in distress Respiratory system: Diminished air entry at bases, no wheezing or rhonchi heard Cardiovascular system: S1-S2 heard, regular rate rhythm, pedal edema present Gastrointestinal system: Abdomen is soft nontender nondistended bowel sounds normal Central nervous system: Alert and oriented, grossly nonfocal Extremities: Venous stasis dermatitis and 3+ leg edema present  skin: Erythema and tenderness of lower extremities Psychiatry: Mood is appropriate    Data Reviewed: I have personally reviewed following labs and imaging studies  CBC: Recent Labs  Lab 01/05/21 1418 01/05/21 2200 01/06/21 0408  WBC 7.7  --  7.8  NEUTROABS 5.3  --   --   HGB 14.8 15.3 14.6  HCT 45.7 45.0 45.4  MCV 89.3  --  89.0  PLT 187  --  0000000    Basic Metabolic Panel: Recent Labs  Lab 01/05/21 1418 01/05/21 2200 01/06/21 0408 01/07/21 0230 01/08/21 0148 01/09/21 0111  NA 142 144 141 140 140 139  K 3.6 3.8 3.3* 3.3* 4.1 3.3*  CL 100  --  95* 94* 93* 95*  CO2 33*  --  37* 36* 37* 37*  GLUCOSE 100*  --  113* 98 96 97  BUN 25*  --  20 26* 31* 33*  CREATININE 1.56*  --  1.53* 1.84* 1.91* 1.81*  CALCIUM 9.2  --  9.3 9.5 9.5 9.1  MG  --   --  2.1 2.2  --   --   PHOS  --   --  4.1  --   --   --     GFR: Estimated Creatinine Clearance: 71.5 mL/min (A) (by C-G formula based on SCr of 1.81 mg/dL (H)).  Liver Function Tests: Recent Labs  Lab 01/05/21 1418  AST 19  ALT 16  ALKPHOS 39  BILITOT 0.9  PROT 7.7  ALBUMIN 3.7    CBG: No results for input(s): GLUCAP in the last 168 hours.   Recent Results (from the past 240 hour(s))  SARS CORONAVIRUS 2 (TAT 6-24 HRS) Nasopharyngeal Nasopharyngeal Swab     Status: None   Collection Time: 01/06/21  1:05 AM   Specimen: Nasopharyngeal Swab  Result Value Ref Range Status   SARS Coronavirus 2 NEGATIVE NEGATIVE  Final    Comment: (NOTE) SARS-CoV-2 target nucleic acids are NOT DETECTED.  The SARS-CoV-2 RNA is generally detectable in upper and lower respiratory specimens during the acute phase of infection. Negative results do not preclude SARS-CoV-2 infection, do not rule out co-infections with other pathogens, and should not be used as the sole basis for treatment or other patient management decisions. Negative results must be combined with clinical observations, patient history, and epidemiological information. The expected result is Negative.  Fact Sheet for Patients: SugarRoll.be  Fact Sheet for Healthcare Providers: https://www.woods-mathews.com/  This test is not yet approved or cleared by the [place] FDA and  has been  authorized for detection and/or diagnosis of SARS-CoV-2 by FDA under an Emergency Use Authorization (EUA). This EUA will remain  in effect (meaning this test can be used) for the duration of the COVID-19 declaration under Se ction 564(b)(1) of the Act, 21 U.S.C. section 360bbb-3(b)(1), unless the authorization is terminated or revoked sooner.  Performed at [facility], [place] [address], [place], [place] [address]          Radiology Studies: DG CHEST PORT 1 VIEW  Result Date: 01/07/2021 CLINICAL DATA:  Hypoxia EXAM: PORTABLE CHEST 1 VIEW COMPARISON:  01/05/2021 FINDINGS: Cardiac shadow is enlarged. Aortic calcifications are again noted. The lungs are well aerated bilaterally. Previously seen vascular congestion and interstitial edema has improved in the interval. No new focal infiltrate is seen. No bony abnormality is noted. IMPRESSION: Improved CHF when compare with the prior study. Electronically Signed   By: Inez Catalina M.D.   On: 01/07/2021 20:52        Scheduled Meds: . aspirin  81 mg Oral Daily  . carvedilol  12.5 mg Oral BID WC  . enoxaparin (LOVENOX) injection  90 mg Subcutaneous Q24H  . furosemide   60 mg Intravenous Daily  . potassium chloride  40 mEq Oral BID  . potassium chloride  40 mEq Oral Once  . rosuvastatin  10 mg Oral Daily   Continuous Infusions:   LOS: 4 days        Hosie Poisson, MD [facility]   To contact the attending provider between 7A-7P or the covering provider during after hours 7P-7A, please log into the web site [email or web address] and access using universal [place] password for that web site. If you do not have the password, please call the hospital operator.  01/09/2021, 11:03 AM

## 2021-01-09 NOTE — Progress Notes (Addendum)
Progress Note  Patient Name: Aaron Hale Date of Encounter: 01/09/2021  Primary Cardiologist: Donato Heinz, MD   Subjective   Feeling better today. Unable to use PPV overnight, did not tolerate with sx same as at home. Spoke to sister Bethena Roys on phone in room for 10 mins and discussed care and potential RHC.  Inpatient Medications    Scheduled Meds: . aspirin  81 mg Oral Daily  . carvedilol  12.5 mg Oral BID WC  . enoxaparin (LOVENOX) injection  90 mg Subcutaneous Q24H  . furosemide  60 mg Intravenous Daily  . rosuvastatin  10 mg Oral Daily   Continuous Infusions:  PRN Meds: acetaminophen, melatonin, ondansetron (ZOFRAN) IV   Vital Signs    Vitals:   01/08/21 1408 01/08/21 2054 01/09/21 0033 01/09/21 0456  BP: 111/63 126/75  121/74  Pulse:  72 81 60  Resp: '18 17 18 19  '$ Temp: 98.8 F (37.1 C) 99.3 F (37.4 C)  98.5 F (36.9 C)  TempSrc: Oral Oral  Oral  SpO2: 96% 91% 99% 93%  Weight:    (!) 178 kg  Height:        Intake/Output Summary (Last 24 hours) at 01/09/2021 0849 Last data filed at 01/09/2021 [id number] Gross per 24 hour  Intake 750 ml  Output 3300 ml  Net -2550 ml   Filed Weights   01/07/21 0500 01/08/21 0500 01/09/21 0456  Weight: (!) 178.9 kg (!) 178.1 kg (!) 178 kg    Telemetry    SR, lbbb PVCs- Personally Reviewed  ECG    No new - Personally Reviewed  Physical Exam   GEN: No acute distress.   Neck: cannot assess JVD Cardiac: RRR, no murmurs, rubs, or gallops.  Respiratory: Clear to auscultation bilaterally. GI: Soft, nontender, non-distended  MS: 3+ edema; venous stasis changes with fluid filled blister on L shin. Neuro:  Nonfocal  Psych: Normal affect    Labs    Chemistry Recent Labs  Lab 01/05/21 1418 01/05/21 2200 01/07/21 0230 01/08/21 0148 01/09/21 0111  NA 142   < > 140 140 139  K 3.6   < > 3.3* 4.1 3.3*  CL 100   < > 94* 93* 95*  CO2 33*   < > 36* 37* 37*  GLUCOSE 100*   < > 98 96 97  BUN 25*   < > 26* 31* 33*   CREATININE 1.56*   < > 1.84* 1.91* 1.81*  CALCIUM 9.2   < > 9.5 9.5 9.1  PROT 7.7  --   --   --   --   ALBUMIN 3.7  --   --   --   --   AST 19  --   --   --   --   ALT 16  --   --   --   --   ALKPHOS 39  --   --   --   --   BILITOT 0.9  --   --   --   --   GFRNONAA 51*   < > 41* 40* 42*  ANIONGAP 9   < > '10 10 7   '$ < > = values in this interval not displayed.     Hematology Recent Labs  Lab 01/05/21 1418 01/05/21 2200 01/06/21 0408  WBC 7.7  --  7.8  RBC 5.12  --  5.10  HGB 14.8 15.3 14.6  HCT 45.7 45.0 45.4  MCV 89.3  --  89.0  MCH  28.9  --  28.6  MCHC 32.4  --  32.2  RDW 14.1  --  14.1  PLT 187  --  175    Cardiac EnzymesNo results for input(s): TROPONINI in the last 168 hours. No results for input(s): TROPIPOC in the last 168 hours.   BNP Recent Labs  Lab 01/05/21 1418  BNP 31.0     DDimer No results for input(s): DDIMER in the last 168 hours.   Radiology    DG CHEST PORT 1 VIEW  Result Date: 01/07/2021 CLINICAL DATA:  Hypoxia EXAM: PORTABLE CHEST 1 VIEW COMPARISON:  01/05/2021 FINDINGS: Cardiac shadow is enlarged. Aortic calcifications are again noted. The lungs are well aerated bilaterally. Previously seen vascular congestion and interstitial edema has improved in the interval. No new focal infiltrate is seen. No bony abnormality is noted. IMPRESSION: Improved CHF when compare with the prior study. Electronically Signed   By: Inez Catalina M.D.   On: 01/07/2021 20:52    Cardiac Studies   Echo   1. Left ventricular ejection fraction, by estimation, is 55 to 60%. The  left ventricle has normal function. Left ventricular endocardial border  not optimally defined to evaluate regional wall motion. There is not well  visualized left ventricular  hypertrophy. Left ventricular diastolic parameters are indeterminate. Very  technically difficult study with globally preserved LV function.   2. Right ventricular systolic function was not well visualized. The right   ventricular size is not well visualized.   3. The mitral valve was not well visualized. No evidence of mitral valve  regurgitation.   4. The aortic valve was not well visualized. Aortic valve regurgitation  is not visualized.   5. The inferior vena cava is dilated in size with <50% respiratory  variability, suggesting right atrial pressure of 15 mmHg.     Patient Profile     60 y.o. male hx of chronic diastolic heart failure, HTN, HLD, CKD stage IV, OSA on BIPAP, obesity, who is being seen 01/07/2021 for the evaluation of CHF at the request of Dr. Karleen Hampshire.    Assessment & Plan   Active Problems:   Acute exacerbation of CHF (congestive heart failure) (HCC)   Acute on chronic diastolic heart failure -Presented with worsening shortness of breath, orthopnea, >30 pound weight gain in 1 month -Chest x-ray concerning for pulmonary edema  -BNP 31. Hs trop negativex1 -EKG without acute ischemic changes  - Echo 5/4 showed EF 55-60%, unremarkable changes comparing to 2020 Echo  - UOP 3.3L over the past 24 hours, weight is down from 402>> 394>>391 pounds (weight was 384 on 11/08/20 office visit) - on torsemide '40mg'$  daily at home, currently on IV Lasix '60mg'$  BID, intake and output not accurate per patient - Cr 1.8 today, resume lasix 60 mg IV daily and monitor I/O. Volume still up from baseline.  - RHC tomorrow to guide diuresis and get PA pressures with OSA. Shunt run appropriate given O2 requirement and poor quality echo images. INFORMED CONSENT: I have reviewed the risks, indications, and alternatives to right heart catheterization with the patient. Risks include but are not limited to bleeding, infection, vascular injury, stroke, myocardial infarction, arrhythmia, kidney injury, radiation-related injury in the case of prolonged fluoroscopy use, emergency cardiac surgery, and death. The patient understands the risks of serious complication is 1-2 in [id number] with diagnostic cardiac cath, and is willing to  proceed.   RBBB on ECG this admission, previous incomplete LBBB - noctural pasues >3 seconds noted on telemetry  overnight, without symptoms, while sleeping. Likely 2/2 to OSA. - EKG with query alternating LBBB and RBBB (rbbb noted on 01/05/21 ecgs) - reviewed with EP, suspect related to lead placement and less likely alternating bundle branch block. The only lead impacted seems to be lead V1, likely due to height of lead placement. - follow up outpatient with cardiology- recommend homegoing cardiac monitor.   AKI on CKD stage III - Cr baseline around 1.5 over the past year  - Cr 1.53 POA, now 1.84>>1.9 >>1.81 - RHC   Hypokalemia  - K 3.3 , replaced - Mag level nl, replace as needed    Acute on chronic hypoxic respiratory failure - he was weaned off oxygen >1 yr ago, currently uses 2 L nasal cannula oxygen at night because intolerant with BIPAP - he is now on [facility] during the day time, multifactorial 2/2 CHF, OSA, OHS, try wean as tolerated  - suspect the driver in his HF symptoms is OSA/OHS.   Hypertension -Blood pressure is controlled, continue carvedilol, ok with holding amlodipine in the setting of IV diuresis   Hyperlipidemia - ldl 70 - continue crestor    OSA - not complaint with BIPAP , only on 2LNC at night , discussed the importance of treated OSA   Morbid obesity - lifestyle discussed        For questions or updates, please contact [facility] Please consult [email or web address] for contact info under        Signed, Elouise Munroe, MD  01/09/2021, 8:49 AM

## 2021-01-09 NOTE — Progress Notes (Signed)
Pt unable to tolerate CPAP, placed back on 2L [place] by RN. RT will continue to monitor.

## 2021-01-10 DIAGNOSIS — E782 Mixed hyperlipidemia: Secondary | ICD-10-CM

## 2021-01-10 DIAGNOSIS — G4733 Obstructive sleep apnea (adult) (pediatric): Secondary | ICD-10-CM

## 2021-01-10 DIAGNOSIS — I10 Essential (primary) hypertension: Secondary | ICD-10-CM

## 2021-01-10 LAB — BASIC METABOLIC PANEL
Anion gap: 8 (ref 5–15)
BUN: 33 mg/dL — ABNORMAL HIGH (ref 6–20)
CO2: 35 mmol/L — ABNORMAL HIGH (ref 22–32)
Calcium: 9.3 mg/dL (ref 8.9–10.3)
Chloride: 97 mmol/L — ABNORMAL LOW (ref 98–111)
Creatinine, Ser: 1.8 mg/dL — ABNORMAL HIGH (ref 0.61–1.24)
GFR, Estimated: 43 mL/min — ABNORMAL LOW (ref >60)
Glucose, Bld: 104 mg/dL — ABNORMAL HIGH (ref 70–99)
Potassium: 4.1 mmol/L (ref 3.5–5.1)
Sodium: 140 mmol/L (ref 135–145)

## 2021-01-10 LAB — MAGNESIUM: Magnesium: 2.5 mg/dL — ABNORMAL HIGH (ref 1.7–2.4)

## 2021-01-10 MED ORDER — OXYMETAZOLINE HCL 0.05 % NA SOLN
1.0000 | Freq: Two times a day (BID) | NASAL | Status: AC
  Administered 2021-01-10 – 2021-01-11 (×3): 1 via NASAL
  Filled 2021-01-10 (×2): qty 30

## 2021-01-10 MED ORDER — DOXYCYCLINE HYCLATE 100 MG PO TABS
100.0000 mg | ORAL_TABLET | Freq: Two times a day (BID) | ORAL | Status: DC
  Administered 2021-01-10 – 2021-01-11 (×3): 100 mg via ORAL
  Filled 2021-01-10 (×3): qty 1

## 2021-01-10 MED ORDER — TRAMADOL HCL 50 MG PO TABS
50.0000 mg | ORAL_TABLET | Freq: Four times a day (QID) | ORAL | Status: DC | PRN
  Administered 2021-01-10: 50 mg via ORAL
  Filled 2021-01-10: qty 1

## 2021-01-10 NOTE — Progress Notes (Signed)
Progress Note  Patient Name: Aaron Hale Date of Encounter: 01/10/2021  Primary Cardiologist: Donato Heinz, MD   Subjective   Teary eyed emotional Willing to have right heart cath tomorrow. Procedure described in detail   Inpatient Medications    Scheduled Meds: . aspirin  81 mg Oral Daily  . carvedilol  12.5 mg Oral BID WC  . cephALEXin  500 mg Oral Q6H  . enoxaparin (LOVENOX) injection  90 mg Subcutaneous Q24H  . furosemide  60 mg Intravenous Daily  . rosuvastatin  10 mg Oral Daily  . sodium chloride flush  3 mL Intravenous Q12H   Continuous Infusions: . sodium chloride    . sodium chloride     PRN Meds: sodium chloride, acetaminophen, melatonin, ondansetron (ZOFRAN) IV, sodium chloride flush   Vital Signs    Vitals:   01/09/21 0456 01/09/21 0800 01/09/21 2121 01/10/21 0513  BP: 121/74  126/78 (!) 145/77  Pulse: 60  72 74  Resp: '19 20 19 20  '$ Temp: 98.5 F (36.9 C)  99.1 F (37.3 C) 97.9 F (36.6 C)  TempSrc: Oral  Oral Oral  SpO2: 93%  91% 93%  Weight: (!) 178 kg   (!) 177.6 kg  Height:        Intake/Output Summary (Last 24 hours) at 01/10/2021 0831 Last data filed at 01/10/2021 0500 Gross per 24 hour  Intake --  Output 1800 ml  Net -1800 ml   Filed Weights   01/08/21 0500 01/09/21 0456 01/10/21 0513  Weight: (!) 178.1 kg (!) 178 kg (!) 177.6 kg    Telemetry    SR, lbbb PVCs- Personally Reviewed  ECG    No new - Personally Reviewed  Physical Exam   Affect appropriate Morbidly obese male  HEENT: normal Neck supple with no adenopathy JVP normal no bruits no thyromegaly Lungs clear with no wheezing and good diaphragmatic motion Heart:  Distant heart sounds  Abdomen: benighn, BS positve, no tenderness, no AAA no bruit.  No HSM or HJR Distal pulses intact with no bruits Plus 2 bilateral  edema Neuro non-focal Skin warm and dry No muscular weakness    Labs    Chemistry Recent Labs  Lab 01/05/21 1418 01/05/21 2200  01/08/21 0148 01/09/21 0111 01/10/21 0122  NA 142   < > 140 139 140  K 3.6   < > 4.1 3.3* 4.1  CL 100   < > 93* 95* 97*  CO2 33*   < > 37* 37* 35*  GLUCOSE 100*   < > 96 97 104*  BUN 25*   < > 31* 33* 33*  CREATININE 1.56*   < > 1.91* 1.81* 1.80*  CALCIUM 9.2   < > 9.5 9.1 9.3  PROT 7.7  --   --   --   --   ALBUMIN 3.7  --   --   --   --   AST 19  --   --   --   --   ALT 16  --   --   --   --   ALKPHOS 39  --   --   --   --   BILITOT 0.9  --   --   --   --   GFRNONAA 51*   < > 40* 42* 43*  ANIONGAP 9   < > '10 7 8   '$ < > = values in this interval not displayed.     Hematology Recent Labs  Lab  01/05/21 1418 01/05/21 2200 01/06/21 0408  WBC 7.7  --  7.8  RBC 5.12  --  5.10  HGB 14.8 15.3 14.6  HCT 45.7 45.0 45.4  MCV 89.3  --  89.0  MCH 28.9  --  28.6  MCHC 32.4  --  32.2  RDW 14.1  --  14.1  PLT 187  --  175    Cardiac EnzymesNo results for input(s): TROPONINI in the last 168 hours. No results for input(s): TROPIPOC in the last 168 hours.   BNP Recent Labs  Lab 01/05/21 1418  BNP 31.0     DDimer No results for input(s): DDIMER in the last 168 hours.   Radiology    No results found.  Cardiac Studies   Echo   1. Left ventricular ejection fraction, by estimation, is 55 to 60%. The  left ventricle has normal function. Left ventricular endocardial border  not optimally defined to evaluate regional wall motion. There is not well  visualized left ventricular  hypertrophy. Left ventricular diastolic parameters are indeterminate. Very  technically difficult study with globally preserved LV function.   2. Right ventricular systolic function was not well visualized. The right  ventricular size is not well visualized.   3. The mitral valve was not well visualized. No evidence of mitral valve  regurgitation.   4. The aortic valve was not well visualized. Aortic valve regurgitation  is not visualized.   5. The inferior vena cava is dilated in size with <50%  respiratory  variability, suggesting right atrial pressure of 15 mmHg.     Patient Profile     60 y.o. diastolic CHF, OSA intolerant to PPV , HTN, HLD, CKD stage 4 seen for CHF  Assessment & Plan   Active Problems:   Chronic diastolic (congestive) heart failure (HCC)   Essential hypertension   Morbid obesity (HCC)   Acute exacerbation of CHF (congestive heart failure) (HCC)   Obstructive sleep apnea   Hyperlipidemia   Acute on chronic diastolic heart failure -Good diuresis on iv bid lasix  -BNP normal  -willing to have right heart cath not from leg !! Will try to have iv team Start antecubital iv today Assess pulmonary pressures r/o PHT and  Suspect PCWP won't be that high  INFORMED CONSENT: I have reviewed the risks, indications, and alternatives to right heart catheterization with the patient. Risks include but are not limited to bleeding, infection, vascular injury, stroke, myocardial infarction, arrhythmia, kidney injury, radiation-related injury in the case of prolonged fluoroscopy use, emergency cardiac surgery, and death. The patient understands the risks of serious complication is 1-2 in [id number] with diagnostic cardiac cath, and is willing to proceed.   RBBB on ECG this admission, previous incomplete LBBB - per EP no indication for PPM . LAD, ICLBBB no real RBBB tall R in V1 From lead placement    AKI on CKD stage III - Cr baseline 1.5 today 1.8 stable    Hypokalemia  - K 4.1 this am    Acute on chronic hypoxic respiratory failure - 2 L at night secondary to OSA. Can consider outpatient ENT evaluation for Inspire device    Hypertension -Well controlled.  Continue current medications and low sodium Dash type diet.     Hyperlipidemia - ldl 70 - continue crestor    OSA - not complaint with BIPAP  2 L [place] see above    Morbid obesity - Discussed diet and exercise outpatient referral to [facility]  For questions or updates, please contact  [place] Please consult [email or web address] for contact info under        Signed, Jenkins Rouge, MD  01/10/2021, 8:31 AM

## 2021-01-10 NOTE — Progress Notes (Signed)
Physical Therapy Treatment Patient Details Name: Aaron Hale MRN: [id number] DOB: 17-Mar-1961 Today's Date: 01/10/2021    History of Present Illness pt is a 60 y/o male admitted 5/4 from his nephrologist office hypoxic with O2 sats at 84%, pt with 30 pound weight gain over a month witgh worsening SOB with little exertion due to Acute axacerbation of diastolic CHF. Plan for heart cath 01/11/21. PMHx, morbid obesity, HTN, CHF.    PT Comments    Patient progressing well towards PT goals. Reports now feeling well today and pain in RUE. Noted to have stiffness in RUE and cervical spine with limited AROM in right sidebending and rotation. Tolerated gait training with supervision for safety only requiring 1 standing rest break due to 2-3/4 DOE.SP02 ranging from mid 80s to 93% on 2-3L/min 02 [place]. Encouraged walking hallway again with nursing later today. Plan for Cardiac cath tomorrow. Would benefit from exercise program to improve overall endurance and strength. Will follow.   Follow Up Recommendations  Home health PT;Supervision for mobility/OOB     Equipment Recommendations  None recommended by PT    Recommendations for Other Services       Precautions / Restrictions Precautions Precautions: Fall;Other (comment) Precaution Comments: watch 02 Restrictions Weight Bearing Restrictions: No    Mobility  Bed Mobility               General bed mobility comments: pt OOB in the recliner on arrival.    Transfers Overall transfer level: Needs assistance Equipment used: None Transfers: Sit to/from Stand           General transfer comment: Supervision for safety. Stood from Youth worker.  Ambulation/Gait Ambulation/Gait assistance: Supervision Gait Distance (Feet): 200 Feet Assistive device: None Gait Pattern/deviations: Step-through pattern;Decreased stride length;Wide base of support Gait velocity: decreased   General Gait Details: Slow, waddling like gait pattern with 1 standing  rest break; Sp02 dropped to mid 80s on 2L/min 02 [place], increased to 3L and able to maintain >90%. 2-3/4 DOE.Cues for pursed lip breathing,   Stairs             Wheelchair Mobility    Modified Rankin (Stroke Patients Only)       Balance Overall balance assessment: Needs assistance Sitting-balance support: No upper extremity supported;Feet supported Sitting balance-Leahy Scale: Good     Standing balance support: During functional activity Standing balance-Leahy Scale: Fair                              Cognition Arousal/Alertness: Awake/alert Behavior During Therapy: WFL for tasks assessed/performed Overall Cognitive Status: Within Functional Limits for tasks assessed                                 General Comments: Appears with down mood but laughing and conversing by end of session.      Exercises      General Comments General comments (skin integrity, edema, etc.): Sp02 ranged from mid 80s-93% on 2-3L/,min 02 [place].      Pertinent Vitals/Pain Pain Assessment: Faces Faces Pain Scale: Hurts little more Pain Location: RUE Pain Descriptors / Indicators: Aching;Sore Pain Intervention(s): Monitored during session    Home Living                      Prior Function  PT Goals (current goals can now be found in the care plan section) Progress towards PT goals: Progressing toward goals    Frequency    Min 3X/week      PT Plan Current plan remains appropriate    Co-evaluation              AM-PAC PT "6 Clicks" Mobility   Outcome Measure  Help needed turning from your back to your side while in a flat bed without using bedrails?: A Little Help needed moving from lying on your back to sitting on the side of a flat bed without using bedrails?: A Little Help needed moving to and from a bed to a chair (including a wheelchair)?: None Help needed standing up from a chair using your arms (e.g., wheelchair or  bedside chair)?: None Help needed to walk in hospital room?: A Little Help needed climbing 3-5 steps with a railing? : A Little 6 Click Score: 20    End of Session Equipment Utilized During Treatment: Oxygen Activity Tolerance: Treatment limited secondary to medical complications (Comment);Patient limited by fatigue (drop in Sp02) Patient left: in chair;with call bell/phone within reach Nurse Communication: Mobility status PT Visit Diagnosis: Other abnormalities of gait and mobility (R26.89);Difficulty in walking, not elsewhere classified (R26.2)     Time: 1034-1050 PT Time Calculation (min) (ACUTE ONLY): 16 min  Charges:  $Therapeutic Exercise: 8-22 mins                     Marisa Severin, PT, DPT Acute Rehabilitation Services Pager [phone] Office [place] 01/10/2021, 12:39 PM

## 2021-01-10 NOTE — Progress Notes (Signed)
This RN attempted to obtain informed consent form for right heart catheterization scheduled for this a.m. and pt stated that he is not sure that he wants to have the procedure today. Pt stated that he is not feeling well. Pt educated on importance of procedure in determining his plan of care. Will continue to monitor.    Elaina Hoops, RN

## 2021-01-10 NOTE — Progress Notes (Signed)
PROGRESS NOTE    Aaron Hale  [id number] DOB: April 25, 1961 DOA: 01/05/2021 PCP: Aretta Nip, MD    Chief Complaint  Patient presents with  . Leg Swelling    Brief Narrative: 59 year old gentleman with prior history of severe morbid obesity, obstructive sleep apnea, stage IIIb CKD, chronic diastolic heart failure presents from nephrologist office for hypoxia, weight gain and shortness of breath.  Patient also reports orthopnea.  Chest x-ray on admission shows mild pulmonary edema.  He was started on nasal cannula oxygen and admitted for evaluation of acute CHF. He remains fluid overloaded. He remains on oxygen. he reports he tried the CPAP at night and could not tolerate.  Pt scheduled for right heart cath in am.     Assessment & Plan:   Active Problems:   Chronic diastolic (congestive) heart failure (HCC)   Essential hypertension   Morbid obesity (HCC)   Acute exacerbation of CHF (congestive heart failure) (HCC)   Obstructive sleep apnea   Hyperlipidemia   Acute on chronic diastolic heart failure currently requiring 2 to 3 lit of [place] oxygen at rest and on ambulation.  Evidence of hypoxemia, orthopnea,  dyspnea on exertion bilateral lower extremity edema and chest x-ray showing pulmonary edema. Repeat echocardiogram showed preserved LVef with diastolic dysfunction.  Started the patient on IV lasix BID 60 mg . Diuresed about 5.1 lit overnight.  Cardiology on board and recommendations given. Plan for RHC in am.  Monitor strict intake and output and daily weights, low-sodium diet. Continue with Coreg.  Hold Norvasc to allow room for blood pressure. Therapy evaluations recommending home healthPT .  Will get V/Q eval for evaluation of PE - pending.  CXR shows improvement in CHF. He will probably need oxygen on discharge.    Acute on Stage IIIb CKD Slight worsening of creatinine probably from diuresis. Creatinine on admission at 1.53 to 1.8 and stable around 1.8.   Continue to monitor.    Hypokalemia Replaced.   Hyperlipidemia LDL is 70 Continue with Crestor.   Prolonged QTC Recheck twelve-lead EKG. Magnesium is greater than 2, replace potassium keep it greater than 4.    Morbid obesity Body mass index is 54.62 kg/m. Follow-up with PCP for weight loss and lifestyle modification.  Bilateral lower extremity edema:  Venous duplex ordered for evaluation of DVT. But pt couldn't tolerate the exam .    OSA;  Pt on BIPAP at home but hasn't started it.  BIIPAP ordered here .   Venous stasis Dermatitis of the lower extremities:  Elevated the legs. A small bleb on the left leg.  worsening erythema, tenderness of the lower extremities.  Will start him on doxycycline for possible mild cellulitis.   DVT prophylaxis: Lovenox Code Status: Full code Family Communication: None at bedside  disposition:   Status is: Inpatient  Remains inpatient appropriate because:Ongoing diagnostic testing needed not appropriate for outpatient work up and IV treatments appropriate due to intensity of illness or inability to take PO   Dispo:  Patient From: Home  Planned Disposition: Home with Health Care Svc  Medically stable for discharge: No         Consultants:   Cardiology.   Procedures: Echocardiogram.  Venous duplex of the lower extremities.  RHC scheduled for tomorrow.  Antimicrobials: None  Subjective: He Is worried about the RHC.   Objective: Vitals:   01/09/21 0800 01/09/21 2121 01/10/21 0513 01/10/21 0900  BP:  126/78 (!) 145/77 (!) 148/93  Pulse:  72 74 77  Resp: '20 19 20 20  '$ Temp:  99.1 F (37.3 C) 97.9 F (36.6 C) 98 F (36.7 C)  TempSrc:  Oral Oral Oral  SpO2:  91% 93% 94%  Weight:   (!) 177.6 kg   Height:        Intake/Output Summary (Last 24 hours) at 01/10/2021 1317 Last data filed at 01/10/2021 0900 Gross per 24 hour  Intake 300 ml  Output 1800 ml  Net -1500 ml   Filed Weights   01/08/21 0500 01/09/21 0456  01/10/21 0513  Weight: (!) 178.1 kg (!) 178 kg (!) 177.6 kg    Examination:  General exam: Morbidly obese gentleman on 2 L of nasal cannula oxygen, Respiratory system: Diminished air entry at bases, no wheezing or rhonchi Cardiovascular system: S1-S2 heard, regular rate rhythm, no JVD, pedal edema present Gastrointestinal system: Abdomen is soft, nontender bowel sounds normal Central nervous system: And oriented Extremities: Venous stasis dermatitis and 3+ leg edema present  skin: Erythema and tenderness of lower extremities Psychiatry: Mood is appropriate    Data Reviewed: I have personally reviewed following labs and imaging studies  CBC: Recent Labs  Lab 01/05/21 1418 01/05/21 2200 01/06/21 0408  WBC 7.7  --  7.8  NEUTROABS 5.3  --   --   HGB 14.8 15.3 14.6  HCT 45.7 45.0 45.4  MCV 89.3  --  89.0  PLT 187  --  0000000    Basic Metabolic Panel: Recent Labs  Lab 01/06/21 0408 01/07/21 0230 01/08/21 0148 01/09/21 0111 01/10/21 0122  NA 141 140 140 139 140  K 3.3* 3.3* 4.1 3.3* 4.1  CL 95* 94* 93* 95* 97*  CO2 37* 36* 37* 37* 35*  GLUCOSE 113* 98 96 97 104*  BUN 20 26* 31* 33* 33*  CREATININE 1.53* 1.84* 1.91* 1.81* 1.80*  CALCIUM 9.3 9.5 9.5 9.1 9.3  MG 2.1 2.2  --   --  2.5*  PHOS 4.1  --   --   --   --     GFR: Estimated Creatinine Clearance: 71.7 mL/min (A) (by C-G formula based on SCr of 1.8 mg/dL (H)).  Liver Function Tests: Recent Labs  Lab 01/05/21 1418  AST 19  ALT 16  ALKPHOS 39  BILITOT 0.9  PROT 7.7  ALBUMIN 3.7    CBG: No results for input(s): GLUCAP in the last 168 hours.   Recent Results (from the past 240 hour(s))  SARS CORONAVIRUS 2 (TAT 6-24 HRS) Nasopharyngeal Nasopharyngeal Swab     Status: None   Collection Time: 01/06/21  1:05 AM   Specimen: Nasopharyngeal Swab  Result Value Ref Range Status   SARS Coronavirus 2 NEGATIVE NEGATIVE Final    Comment: (NOTE) SARS-CoV-2 target nucleic acids are NOT DETECTED.  The SARS-CoV-2  RNA is generally detectable in upper and lower respiratory specimens during the acute phase of infection. Negative results do not preclude SARS-CoV-2 infection, do not rule out co-infections with other pathogens, and should not be used as the sole basis for treatment or other patient management decisions. Negative results must be combined with clinical observations, patient history, and epidemiological information. The expected result is Negative.  Fact Sheet for Patients: SugarRoll.be  Fact Sheet for Healthcare Providers: https://www.woods-mathews.com/  This test is not yet approved or cleared by the [place] FDA and  has been authorized for detection and/or diagnosis of SARS-CoV-2 by FDA under an Emergency Use Authorization (EUA). This EUA will remain  in effect (meaning this test can be used)  for the duration of the COVID-19 declaration under Se ction 564(b)(1) of the Act, 21 U.S.C. section 360bbb-3(b)(1), unless the authorization is terminated or revoked sooner.  Performed at [facility], [place] [address] [place], [place] [address]          Radiology Studies: No results found.      Scheduled Meds: . aspirin  81 mg Oral Daily  . carvedilol  12.5 mg Oral BID WC  . cephALEXin  500 mg Oral Q6H  . enoxaparin (LOVENOX) injection  90 mg Subcutaneous Q24H  . furosemide  60 mg Intravenous Daily  . rosuvastatin  10 mg Oral Daily  . sodium chloride flush  3 mL Intravenous Q12H   Continuous Infusions: . sodium chloride    . sodium chloride Stopped (01/10/21 1002)     LOS: 5 days        Hosie Poisson, MD [facility]   To contact the attending provider between 7A-7P or the covering provider during after hours 7P-7A, please log into the web site [email or web address] and access using universal [place] password for that web site. If you do not have the password, please call the hospital operator.  01/10/2021,  1:17 PM

## 2021-01-10 NOTE — Progress Notes (Signed)
RT to bedside to assist pt with BIPAP. Pt with periods of desaturation while wearing BIPAP the previous night, so he only used O2 for sleep. Pt's settings increased from 10/5 to 14/5 with 2L O2 bled in, and pt tried FFM as well as nasal mask, and was not comfortable with the fit of either mask. Pt to have partner bring his personal mask and tubing from home for comfort. Pt not sure of home BIPAP settings, so I advised pt to notify for RT once he had his mask and someone would come up to assist with adjusting settings so that pt was able to tolerate. Pt also complaining of "feeling like he needed to burp" each time pressure was delivered. Pt placed back on 2L [place] for the night. RT will continue to monitor.

## 2021-01-10 NOTE — Progress Notes (Signed)
Order came in for # 20 IV in RT A/C - Called Day Shift RN about patient progress notes from 0600 this am  that patient is not sure he wants Heart Cath done today. Taking order out and asked RN to reorder IV if patient is agreeable later today to have his procedure done.

## 2021-01-10 NOTE — Progress Notes (Signed)
Patient refused BIPAP/CPAP hs. Patient stated he does not like or machine. Patient stated he will get his brought in from home. Patient stated he is fine on [place].

## 2021-01-11 ENCOUNTER — Encounter (HOSPITAL_COMMUNITY)
Admission: EM | Disposition: A | Discharge status: Home Health | Payer: Self-pay | Source: Home / Self Care | Attending: Internal Medicine

## 2021-01-11 HISTORY — PX: RIGHT HEART CATH: CATH118263

## 2021-01-11 LAB — POCT I-STAT 7, (LYTES, BLD GAS, ICA,H+H)
Acid-Base Excess: 8 mmol/L — ABNORMAL HIGH (ref 0.0–2.0)
Acid-Base Excess: 9 mmol/L — ABNORMAL HIGH (ref 0.0–2.0)
Bicarbonate: 36.1 mmol/L — ABNORMAL HIGH (ref 20.0–28.0)
Bicarbonate: 37.2 mmol/L — ABNORMAL HIGH (ref 20.0–28.0)
Calcium, Ion: 1.15 mmol/L (ref 1.15–1.40)
Calcium, Ion: 1.24 mmol/L (ref 1.15–1.40)
HCT: 40 % (ref 39.0–52.0)
HCT: 42 % (ref 39.0–52.0)
Hemoglobin: 13.6 g/dL (ref 13.0–17.0)
Hemoglobin: 14.3 g/dL (ref 13.0–17.0)
O2 Saturation: 64 %
O2 Saturation: 67 %
Potassium: 3.9 mmol/L (ref 3.5–5.1)
Potassium: 4.2 mmol/L (ref 3.5–5.1)
Sodium: 141 mmol/L (ref 135–145)
Sodium: 142 mmol/L (ref 135–145)
TCO2: 38 mmol/L — ABNORMAL HIGH (ref 22–32)
TCO2: 39 mmol/L — ABNORMAL HIGH (ref 22–32)
pCO2 arterial: 66.3 mmHg (ref 32.0–48.0)
pCO2 arterial: 68.3 mmHg (ref 32.0–48.0)
pH, Arterial: 7.344 — ABNORMAL LOW (ref 7.350–7.450)
pH, Arterial: 7.344 — ABNORMAL LOW (ref 7.350–7.450)
pO2, Arterial: 37 mmHg — CL (ref 83.0–108.0)
pO2, Arterial: 38 mmHg — CL (ref 83.0–108.0)

## 2021-01-11 LAB — BASIC METABOLIC PANEL
Anion gap: 12 (ref 5–15)
BUN: 32 mg/dL — ABNORMAL HIGH (ref 6–20)
CO2: 33 mmol/L — ABNORMAL HIGH (ref 22–32)
Calcium: 9.5 mg/dL (ref 8.9–10.3)
Chloride: 94 mmol/L — ABNORMAL LOW (ref 98–111)
Creatinine, Ser: 1.72 mg/dL — ABNORMAL HIGH (ref 0.61–1.24)
GFR, Estimated: 45 mL/min — ABNORMAL LOW (ref >60)
Glucose, Bld: 95 mg/dL (ref 70–99)
Potassium: 3.9 mmol/L (ref 3.5–5.1)
Sodium: 139 mmol/L (ref 135–145)

## 2021-01-11 SURGERY — RIGHT HEART CATH

## 2021-01-11 MED ORDER — HYDRALAZINE HCL 20 MG/ML IJ SOLN: 10.0000 mg | INTRAMUSCULAR | Status: AC | PRN

## 2021-01-11 MED ORDER — HEPARIN (PORCINE) IN NACL 1000-0.9 UT/500ML-% IV SOLN
INTRAVENOUS | Status: AC
  Filled 2021-01-11: qty 500

## 2021-01-11 MED ORDER — SODIUM CHLORIDE 0.9 % IV SOLN: INTRAVENOUS | Status: DC

## 2021-01-11 MED ORDER — LIDOCAINE HCL (PF) 1 % IJ SOLN
INTRAMUSCULAR | Status: AC
  Filled 2021-01-11: qty 30

## 2021-01-11 MED ORDER — ONDANSETRON HCL 4 MG/2ML IJ SOLN: 4.0000 mg | Freq: Four times a day (QID) | INTRAMUSCULAR | Status: DC | PRN

## 2021-01-11 MED ORDER — SODIUM CHLORIDE 0.9% FLUSH
3.0000 mL | Freq: Two times a day (BID) | INTRAVENOUS | Status: DC
  Administered 2021-01-11 – 2021-01-13 (×5): 3 mL via INTRAVENOUS

## 2021-01-11 MED ORDER — SODIUM CHLORIDE 0.9% FLUSH: 3.0000 mL | INTRAVENOUS | Status: DC | PRN

## 2021-01-11 MED ORDER — HEPARIN (PORCINE) IN NACL 1000-0.9 UT/500ML-% IV SOLN
INTRAVENOUS | Status: DC | PRN
  Administered 2021-01-11: 500 mL

## 2021-01-11 MED ORDER — LABETALOL HCL 5 MG/ML IV SOLN: 10.0000 mg | INTRAVENOUS | Status: AC | PRN

## 2021-01-11 MED ORDER — LIDOCAINE HCL (PF) 1 % IJ SOLN
INTRAMUSCULAR | Status: DC | PRN
  Administered 2021-01-11: 2 mL

## 2021-01-11 MED ORDER — ACETAMINOPHEN 325 MG PO TABS
650.0000 mg | ORAL_TABLET | ORAL | Status: DC | PRN
  Administered 2021-01-12: 650 mg via ORAL
  Filled 2021-01-11: qty 2

## 2021-01-11 MED ORDER — SODIUM CHLORIDE 0.9 % IV SOLN: 250.0000 mL | INTRAVENOUS | Status: DC | PRN

## 2021-01-11 SURGICAL SUPPLY — 5 items
CATH SWAN GANZ 7F STRAIGHT (CATHETERS) ×2 IMPLANT
GLIDESHEATH SLENDER 7FR .021G (SHEATH) ×2 IMPLANT
MAT PREVALON FULL STRYKER (MISCELLANEOUS) ×2 IMPLANT
PACK CARDIAC CATHETERIZATION (CUSTOM PROCEDURE TRAY) ×2 IMPLANT
TRANSDUCER W/STOPCOCK (MISCELLANEOUS) ×2 IMPLANT

## 2021-01-11 NOTE — Progress Notes (Signed)
SATURATION QUALIFICATIONS: (This note is used to comply with regulatory documentation for home oxygen)  Patient Saturations on Room Air at Rest = 86%  Patient Saturations on Room Air while Ambulating = 86%  Patient Saturations on 3Liters of oxygen while Ambulating = 90%  Please briefly explain why patient needs home oxygen: Patient O2 saturations fluctuate between 86-97% depending on posture and level of exertion and amount of oxygen he's getting.. If he sits forward instead of back his O2 drops below 90% even on 3 Liters. Resting in his chair with 3 liters Nasal Cannula he is saturating at 92%.

## 2021-01-11 NOTE — TOC Initial Note (Addendum)
Transition of Care (TOC) - Initial/Assessment Note  Heart Failure   Patient Details  Name: Aaron Hale MRN: [id number] Date of Birth: 12-Dec-1960  [facility]) CM/SW Contact:    Martell, Sherman Phone Number: 01/11/2021, 4:54 PM  Clinical Narrative:                 CSW per RN HF Navigator spoke with patient at bedside about the Heart Failure Clinic and bridging the gap between inpatient and outpatient care. CSW completed a very brief SDOH with the patient and assessing immediate needs. The patient reported having a PCP, cardiologist and kidney doctor and he uses the [facility] and he is able to pick up his prescriptions and get to doctors appointments. CSW provided the patient with the social workers name, number and position and to reach out to CSW as other social needs arise.       TOC will continue to follow through discharge pending d/c needs.    Barriers to Discharge: Continued Medical Work up   Patient Goals and CMS Choice        Expected Discharge Plan and Services   In-house Referral: Clinical Social Work     Living arrangements for the past 2 months: Apartment                                      Prior Living Arrangements/Services Living arrangements for the past 2 months: Apartment   Patient language and need for interpreter reviewed:: Yes        Need for Family Participation in Patient Care: No (Comment) Care giver support system in place?: No (comment)   Criminal Activity/Legal Involvement Pertinent to Current Situation/Hospitalization: No - Comment as needed  Activities of Daily Living Home Assistive Devices/Equipment: Cane (specify quad or straight),BIPAP,Blood pressure cuff,Oxygen,Scales (straight cane) ADL Screening (condition at time of admission) Patient's cognitive ability adequate to safely complete daily activities?: Yes Is the patient deaf or have difficulty hearing?: No Does the patient have difficulty seeing, even  when wearing glasses/contacts?: No Does the patient have difficulty concentrating, remembering, or making decisions?: No Patient able to express need for assistance with ADLs?: Yes Does the patient have difficulty dressing or bathing?: No Independently performs ADLs?: Yes (appropriate for developmental age) Does the patient have difficulty walking or climbing stairs?: Yes (d/t back pain and BLE edema; uses a straight cane at home) Weakness of Legs: None Weakness of Arms/Hands: None  Permission Sought/Granted                  Emotional Assessment Appearance:: Appears stated age Attitude/Demeanor/Rapport: Engaged Affect (typically observed): Pleasant Orientation: : Oriented to Self,Oriented to Place,Oriented to  Time,Oriented to Situation   Psych Involvement: No (comment)  Admission diagnosis:  Dyspnea on exertion [R06.00] Hypoxia [R09.02] Acute exacerbation of CHF (congestive heart failure) ([place]) [I50.9] Patient Active Problem List   Diagnosis Date Noted  . Obstructive sleep apnea 01/09/2021  . Hyperlipidemia 01/09/2021  . Acute exacerbation of CHF (congestive heart failure) ([place]) 01/05/2021  . Chronic diastolic (congestive) heart failure ([place]) 08/11/2020  . Essential hypertension 08/11/2020  . CRI (chronic renal insufficiency), stage 4 (severe) ([place]) 08/11/2020  . Sleep apnea 08/11/2020  . Morbid obesity ([place]) 08/11/2020  . Shortness of breath 03/30/2020  . Cellulitis 05/03/2019   PCP:  Aretta Nip, MD Pharmacy:   [facility]), [place] [phone] [place] ([place]) [place] [address] Phone: [phone] Fax: [phone]     Social Determinants of Health (SDOH) Interventions Food Insecurity Interventions: Intervention Not Indicated Financial Strain Interventions: Intervention Not Indicated Housing Interventions: Intervention Not Indicated Transportation Interventions: Intervention Not Indicated  Readmission  Risk Interventions No flowsheet data found.  Carzell Saldivar, MSW, [place] Heart Failure Social Worker

## 2021-01-11 NOTE — Interval H&P Note (Signed)
History and Physical Interval Note:  01/11/2021 8:27 AM  Aaron Hale  has presented today for surgery, with the diagnosis of HF.  The various methods of treatment have been discussed with the patient and family. After consideration of risks, benefits and other options for treatment, the patient has consented to  Procedure(s): RIGHT HEART CATH (N/A) as a surgical intervention.  The patient's history has been reviewed, patient examined, no change in status, stable for surgery.  I have reviewed the patient's chart and labs.  Questions were answered to the patient's satisfaction.     Jameria Bradway

## 2021-01-11 NOTE — H&P (View-Only) (Signed)
Progress Note  Patient Name: Aaron Hale Date of Encounter: 01/11/2021  Primary Cardiologist: Donato Heinz, MD   Subjective   Better disposition seen prior to going for right heart cath  Cath not to be done from femoral vein   Inpatient Medications    Scheduled Meds: . [MAR Hold] aspirin  81 mg Oral Daily  . [MAR Hold] carvedilol  12.5 mg Oral BID WC  . [MAR Hold] cephALEXin  500 mg Oral Q6H  . [MAR Hold] doxycycline  100 mg Oral Q12H  . [MAR Hold] enoxaparin (LOVENOX) injection  90 mg Subcutaneous Q24H  . [MAR Hold] furosemide  60 mg Intravenous Daily  . [MAR Hold] oxymetazoline  1 spray Each Nare BID  . [MAR Hold] rosuvastatin  10 mg Oral Daily  . sodium chloride flush  3 mL Intravenous Q12H   Continuous Infusions: . sodium chloride    . sodium chloride Stopped (01/10/21 1002)  . sodium chloride 10 mL/hr at 01/11/21 0617   PRN Meds: sodium chloride, [MAR Hold] acetaminophen, Heparin (Porcine) in NaCl, [MAR Hold] melatonin, [MAR Hold] ondansetron (ZOFRAN) IV, sodium chloride flush, [MAR Hold] traMADol   Vital Signs    Vitals:   01/10/21 2009 01/11/21 0413 01/11/21 0737 01/11/21 0754  BP: (!) 143/74 (!) 153/72 (!) 160/112   Pulse: 70 79 98   Resp: 17 15    Temp: 98.1 F (36.7 C) 97.8 F (36.6 C)    TempSrc: Oral Oral    SpO2: 96% 94%  95%  Weight:  (!) 177.5 kg    Height:        Intake/Output Summary (Last 24 hours) at 01/11/2021 0818 Last data filed at 01/11/2021 0000 Gross per 24 hour  Intake 1320 ml  Output 1200 ml  Net 120 ml   Filed Weights   01/09/21 0456 01/10/21 0513 01/11/21 0413  Weight: (!) 178 kg (!) 177.6 kg (!) 177.5 kg    Telemetry    SR, lbbb PVCs- Personally Reviewed  ECG    No new - Personally Reviewed  Physical Exam   Affect appropriate Morbidly obese male  HEENT: normal Neck supple with no adenopathy JVP normal no bruits no thyromegaly Lungs clear with no wheezing and good diaphragmatic motion Heart:  Distant  heart sounds  Abdomen: benighn, BS positve, no tenderness, no AAA no bruit.  No HSM or HJR Distal pulses intact with no bruits Plus 2 bilateral  edema Neuro non-focal Skin warm and dry No muscular weakness    Labs    Chemistry Recent Labs  Lab 01/05/21 1418 01/05/21 2200 01/09/21 0111 01/10/21 0122 01/11/21 0512  NA 142   < > 139 140 139  K 3.6   < > 3.3* 4.1 3.9  CL 100   < > 95* 97* 94*  CO2 33*   < > 37* 35* 33*  GLUCOSE 100*   < > 97 104* 95  BUN 25*   < > 33* 33* 32*  CREATININE 1.56*   < > 1.81* 1.80* 1.72*  CALCIUM 9.2   < > 9.1 9.3 9.5  PROT 7.7  --   --   --   --   ALBUMIN 3.7  --   --   --   --   AST 19  --   --   --   --   ALT 16  --   --   --   --   ALKPHOS 39  --   --   --   --  BILITOT 0.9  --   --   --   --   GFRNONAA 51*   < > 42* 43* 45*  ANIONGAP 9   < > '7 8 12   '$ < > = values in this interval not displayed.     Hematology Recent Labs  Lab 01/05/21 1418 01/05/21 2200 01/06/21 0408  WBC 7.7  --  7.8  RBC 5.12  --  5.10  HGB 14.8 15.3 14.6  HCT 45.7 45.0 45.4  MCV 89.3  --  89.0  MCH 28.9  --  28.6  MCHC 32.4  --  32.2  RDW 14.1  --  14.1  PLT 187  --  175    Cardiac EnzymesNo results for input(s): TROPONINI in the last 168 hours. No results for input(s): TROPIPOC in the last 168 hours.   BNP Recent Labs  Lab 01/05/21 1418  BNP 31.0     DDimer No results for input(s): DDIMER in the last 168 hours.   Radiology    No results found.  Cardiac Studies   Echo   1. Left ventricular ejection fraction, by estimation, is 55 to 60%. The  left ventricle has normal function. Left ventricular endocardial border  not optimally defined to evaluate regional wall motion. There is not well  visualized left ventricular  hypertrophy. Left ventricular diastolic parameters are indeterminate. Very  technically difficult study with globally preserved LV function.   2. Right ventricular systolic function was not well visualized. The right   ventricular size is not well visualized.   3. The mitral valve was not well visualized. No evidence of mitral valve  regurgitation.   4. The aortic valve was not well visualized. Aortic valve regurgitation  is not visualized.   5. The inferior vena cava is dilated in size with <50% respiratory  variability, suggesting right atrial pressure of 15 mmHg.     Patient Profile     60 y.o. diastolic CHF, OSA intolerant to PPV , HTN, HLD, CKD stage 4 seen for CHF  Assessment & Plan   Active Problems:   Chronic diastolic (congestive) heart failure (HCC)   Essential hypertension   Morbid obesity (HCC)   Acute exacerbation of CHF (congestive heart failure) (HCC)   Obstructive sleep apnea   Hyperlipidemia   Acute on chronic diastolic heart failure -Good diuresis on iv bid lasix  -BNP normal  -right heart cath this am   INFORMED CONSENT: I have reviewed the risks, indications, and alternatives to right heart catheterization with the patient. Risks include but are not limited to bleeding, infection, vascular injury, stroke, myocardial infarction, arrhythmia, kidney injury, radiation-related injury in the case of prolonged fluoroscopy use, emergency cardiac surgery, and death. The patient understands the risks of serious complication is 1-2 in 123XX123 with diagnostic cardiac cath, and is willing to proceed.   RBBB on ECG this admission, previous incomplete LBBB - per EP no indication for PPM . LAD, ICLBBB no real RBBB tall R in V1 From lead placement    AKI on CKD stage III - Cr baseline 1.5 today 1.8 stable    Hypokalemia  - K 4.1 this am    Acute on chronic hypoxic respiratory failure - 2 L at night secondary to OSA. Can consider outpatient ENT evaluation for Inspire device    Hypertension -Well controlled.  Continue current medications and low sodium Dash type diet.     Hyperlipidemia - ldl 70 - continue crestor    OSA - not complaint  with BIPAP  2 L [place] see above    Morbid  obesity - Discussed diet and exercise outpatient referral to [facility]        For questions or updates, please contact [place] Please consult [email or web address] for contact info under        Signed, Jenkins Rouge, MD  01/11/2021, 8:18 AM

## 2021-01-11 NOTE — Progress Notes (Signed)
PROGRESS NOTE    Lauris Umbach  [id number] DOB: May 27, 1961 DOA: 01/05/2021 PCP: Aretta Nip, MD    Chief Complaint  Patient presents with  . Leg Swelling    Brief Narrative: 60 year old gentleman with prior history of severe morbid obesity, obstructive sleep apnea, stage IIIb CKD, chronic diastolic heart failure presents from nephrologist office for hypoxia, weight gain and shortness of breath.  Patient also reports orthopnea.  Chest x-ray on admission shows mild pulmonary edema.  He was started on nasal cannula oxygen and admitted for evaluation of acute CHF. He remains fluid overloaded. He remains on oxygen. he reports he tried the CPAP at night and could not tolerate.  Pt scheduled for right heart cath in am.     Assessment & Plan:   Active Problems:   Chronic diastolic (congestive) heart failure (HCC)   Essential hypertension   Morbid obesity (HCC)   Acute exacerbation of CHF (congestive heart failure) (HCC)   Obstructive sleep apnea   Hyperlipidemia   Acute on chronic diastolic heart failure currently requiring 2 to 3 lit of [place] oxygen at rest and on ambulation.  Evidence of hypoxemia, orthopnea,  dyspnea on exertion bilateral lower extremity edema and chest x-ray showing pulmonary edema. Repeat echocardiogram showed preserved LVef with diastolic dysfunction.   Started the patient on IV lasix BID 60 mg , decreased the dose of lasix to 60 mg daily, with possible transition to oral tomorrow.  Cardiology on board and he underwent RHC on 01/11/21, showing mild pulmonary hypertension.  Monitor strict intake and output and daily weights, low-sodium diet. Continue with Coreg 12.5 mg BID.  Hold Norvasc to allow room for blood pressure. Therapy evaluations recommending home health PT .  Will get V/Q eval for evaluation of PE - pending.  CXR shows improvement in CHF. He will probably need oxygen on discharge. Check ambulating oxygen levels today.    Acute on Stage IIIb  CKD Slight worsening of creatinine probably from diuresis. Decreased the dose of lasix to daily.  Creatinine on admission at 1.53 , peaked to 1.9, currently at 1.7 Continue to monitor.    Hypokalemia Replaced.   Hyperlipidemia LDL is 70 Continue with Crestor.   Prolonged QTC Recheck twelve-lead EKG. Magnesium is greater than 2, replace potassium keep it greater than 4.    Morbid obesity Body mass index is 54.59 kg/m. Follow-up with PCP for weight loss and lifestyle modification.  Bilateral lower extremity edema:  Venous duplex ordered for evaluation of DVT. But pt couldn't tolerate the exam .    OSA;  Pt on BIPAP at home but hasn't started it.  BIIPAP ordered here .   Venous stasis Dermatitis of the lower extremities:  Elevated the legs. A small bleb on the left leg.   with worsening erythema, tenderness of the lower extremities.  Will start him on doxycycline for possible mild cellulitis.  Venous duplex of the lower extremities ordered but he couldn't tolerate the exam due to pain.  DVT prophylaxis: Lovenox Code Status: Full code Family Communication: None at bedside  disposition:   Status is: Inpatient  Remains inpatient appropriate because:Ongoing diagnostic testing needed not appropriate for outpatient work up and IV treatments appropriate due to intensity of illness or inability to take PO   Dispo:  Patient From: Home  Planned Disposition: Home with Health Care Svc  Medically stable for discharge: No         Consultants:   Cardiology.   Procedures: Echocardiogram.  Venous duplex of  the lower extremities.  RHC scheduled for tomorrow.  Antimicrobials: None  Subjective: No new complaints, wants to know when he can go home.   Objective: Vitals:   01/11/21 0840 01/11/21 0845 01/11/21 0850 01/11/21 0918  BP: (!) 147/86 (!) 145/79 (!) 150/87 110/76  Pulse: 79 69 74 76  Resp: '13 15 12 12  '$ Temp:    98 F (36.7 C)  TempSrc:    Oral  SpO2: 98%  99% (!) 0% (!) 88%  Weight:      Height:        Intake/Output Summary (Last 24 hours) at 01/11/2021 1217 Last data filed at 01/11/2021 0000 Gross per 24 hour  Intake 1020 ml  Output 1200 ml  Net -180 ml   Filed Weights   01/09/21 0456 01/10/21 0513 01/11/21 0413  Weight: (!) 178 kg (!) 177.6 kg (!) 177.5 kg    Examination:  General exam: Morbidly obese gentleman not in any kind of distress on 2 L of nasal cannula oxygen Respiratory system: Diminished air entry at bases no wheezing or rhonchi Cardiovascular system: S1-S2 heard, regular rate rhythm, pedal edema present Gastrointestinal system: Abdomen is soft nontender nondistended bowel sounds normal Central nervous system: Alert and oriented, grossly nonfocal Extremities: Venous stasis dermatitis and 3+ leg edema present skin: Erythema and tenderness of the lower extremities Psychiatry: Mood is appropriate    Data Reviewed: I have personally reviewed following labs and imaging studies  CBC: Recent Labs  Lab 01/05/21 1418 01/05/21 2200 01/06/21 0408 01/11/21 0845  WBC 7.7  --  7.8  --   NEUTROABS 5.3  --   --   --   HGB 14.8 15.3 14.6 14.3  13.6  HCT 45.7 45.0 45.4 42.0  40.0  MCV 89.3  --  89.0  --   PLT 187  --  175  --     Basic Metabolic Panel: Recent Labs  Lab 01/06/21 0408 01/07/21 0230 01/08/21 0148 01/09/21 0111 01/10/21 0122 01/11/21 0512 01/11/21 0845  NA 141 140 140 139 140 139 141  142  K 3.3* 3.3* 4.1 3.3* 4.1 3.9 4.2  3.9  CL 95* 94* 93* 95* 97* 94*  --   CO2 37* 36* 37* 37* 35* 33*  --   GLUCOSE 113* 98 96 97 104* 95  --   BUN 20 26* 31* 33* 33* 32*  --   CREATININE 1.53* 1.84* 1.91* 1.81* 1.80* 1.72*  --   CALCIUM 9.3 9.5 9.5 9.1 9.3 9.5  --   MG 2.1 2.2  --   --  2.5*  --   --   PHOS 4.1  --   --   --   --   --   --     GFR: Estimated Creatinine Clearance: 75.1 mL/min (A) (by C-G formula based on SCr of 1.72 mg/dL (H)).  Liver Function Tests: Recent Labs  Lab 01/05/21 1418   AST 19  ALT 16  ALKPHOS 39  BILITOT 0.9  PROT 7.7  ALBUMIN 3.7    CBG: No results for input(s): GLUCAP in the last 168 hours.   Recent Results (from the past 240 hour(s))  SARS CORONAVIRUS 2 (TAT 6-24 HRS) Nasopharyngeal Nasopharyngeal Swab     Status: None   Collection Time: 01/06/21  1:05 AM   Specimen: Nasopharyngeal Swab  Result Value Ref Range Status   SARS Coronavirus 2 NEGATIVE NEGATIVE Final    Comment: (NOTE) SARS-CoV-2 target nucleic acids are NOT DETECTED.  The SARS-CoV-2  RNA is generally detectable in upper and lower respiratory specimens during the acute phase of infection. Negative results do not preclude SARS-CoV-2 infection, do not rule out co-infections with other pathogens, and should not be used as the sole basis for treatment or other patient management decisions. Negative results must be combined with clinical observations, patient history, and epidemiological information. The expected result is Negative.  Fact Sheet for Patients: SugarRoll.be  Fact Sheet for Healthcare Providers: https://www.woods-mathews.com/  This test is not yet approved or cleared by the [place] FDA and  has been authorized for detection and/or diagnosis of SARS-CoV-2 by FDA under an Emergency Use Authorization (EUA). This EUA will remain  in effect (meaning this test can be used) for the duration of the COVID-19 declaration under Se ction 564(b)(1) of the Act, 21 U.S.C. section 360bbb-3(b)(1), unless the authorization is terminated or revoked sooner.  Performed at [facility], [place] [address] [place], [place] [address]          Radiology Studies: CARDIAC CATHETERIZATION  Result Date: 01/11/2021 Findings: RA = 9 RV = 42/13 PA = 38/10 (28) PCW = 13 Fick cardiac output/index = 6.1/2.2 Thermo CO/CI = 8.4/3.0 PVR = 2.5 (Fick), 1.8 (Thermo) Ao sat = 96% PA sat = 64%, 68% Assessment: 1. 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 Switch to po per primary team. Consider SGLT2i. Glori Bickers, MD 8:58 AM       Scheduled Meds: . aspirin  81 mg Oral Daily  . carvedilol  12.5 mg Oral BID WC  . cephALEXin  500 mg Oral Q6H  . doxycycline  100 mg Oral Q12H  . enoxaparin (LOVENOX) injection  90 mg Subcutaneous Q24H  . furosemide  60 mg Intravenous Daily  . oxymetazoline  1 spray Each Nare BID  . rosuvastatin  10 mg Oral Daily  . sodium chloride flush  3 mL Intravenous Q12H   Continuous Infusions: . sodium chloride 10 mL/hr at 01/11/21 0617  . sodium chloride       LOS: 6 days        Hosie Poisson, MD [facility]   To contact the attending provider between 7A-7P or the covering provider during after hours 7P-7A, please log into the web site [email or web address] and access using universal [place] password for that web site. If you do not have the password, please call the hospital operator.  01/11/2021, 12:17 PM

## 2021-01-11 NOTE — Progress Notes (Signed)
Progress Note  Patient Name: Aaron Hale Date of Encounter: 01/11/2021  Primary Cardiologist: Donato Heinz, MD   Subjective   Better disposition seen prior to going for right heart cath  Cath not to be done from femoral vein   Inpatient Medications    Scheduled Meds: . [MAR Hold] aspirin  81 mg Oral Daily  . [MAR Hold] carvedilol  12.5 mg Oral BID WC  . [MAR Hold] cephALEXin  500 mg Oral Q6H  . [MAR Hold] doxycycline  100 mg Oral Q12H  . [MAR Hold] enoxaparin (LOVENOX) injection  90 mg Subcutaneous Q24H  . [MAR Hold] furosemide  60 mg Intravenous Daily  . [MAR Hold] oxymetazoline  1 spray Each Nare BID  . [MAR Hold] rosuvastatin  10 mg Oral Daily  . sodium chloride flush  3 mL Intravenous Q12H   Continuous Infusions: . sodium chloride    . sodium chloride Stopped (01/10/21 1002)  . sodium chloride 10 mL/hr at 01/11/21 0617   PRN Meds: sodium chloride, [MAR Hold] acetaminophen, Heparin (Porcine) in NaCl, [MAR Hold] melatonin, [MAR Hold] ondansetron (ZOFRAN) IV, sodium chloride flush, [MAR Hold] traMADol   Vital Signs    Vitals:   01/10/21 2009 01/11/21 0413 01/11/21 0737 01/11/21 0754  BP: (!) 143/74 (!) 153/72 (!) 160/112   Pulse: 70 79 98   Resp: 17 15    Temp: 98.1 F (36.7 C) 97.8 F (36.6 C)    TempSrc: Oral Oral    SpO2: 96% 94%  95%  Weight:  (!) 177.5 kg    Height:        Intake/Output Summary (Last 24 hours) at 01/11/2021 0818 Last data filed at 01/11/2021 0000 Gross per 24 hour  Intake 1320 ml  Output 1200 ml  Net 120 ml   Filed Weights   01/09/21 0456 01/10/21 0513 01/11/21 0413  Weight: (!) 178 kg (!) 177.6 kg (!) 177.5 kg    Telemetry    SR, lbbb PVCs- Personally Reviewed  ECG    No new - Personally Reviewed  Physical Exam   Affect appropriate Morbidly obese male  HEENT: normal Neck supple with no adenopathy JVP normal no bruits no thyromegaly Lungs clear with no wheezing and good diaphragmatic motion Heart:  Distant  heart sounds  Abdomen: benighn, BS positve, no tenderness, no AAA no bruit.  No HSM or HJR Distal pulses intact with no bruits Plus 2 bilateral  edema Neuro non-focal Skin warm and dry No muscular weakness    Labs    Chemistry Recent Labs  Lab 01/05/21 1418 01/05/21 2200 01/09/21 0111 01/10/21 0122 01/11/21 0512  NA 142   < > 139 140 139  K 3.6   < > 3.3* 4.1 3.9  CL 100   < > 95* 97* 94*  CO2 33*   < > 37* 35* 33*  GLUCOSE 100*   < > 97 104* 95  BUN 25*   < > 33* 33* 32*  CREATININE 1.56*   < > 1.81* 1.80* 1.72*  CALCIUM 9.2   < > 9.1 9.3 9.5  PROT 7.7  --   --   --   --   ALBUMIN 3.7  --   --   --   --   AST 19  --   --   --   --   ALT 16  --   --   --   --   ALKPHOS 39  --   --   --   --  BILITOT 0.9  --   --   --   --   GFRNONAA 51*   < > 42* 43* 45*  ANIONGAP 9   < > '7 8 12   '$ < > = values in this interval not displayed.     Hematology Recent Labs  Lab 01/05/21 1418 01/05/21 2200 01/06/21 0408  WBC 7.7  --  7.8  RBC 5.12  --  5.10  HGB 14.8 15.3 14.6  HCT 45.7 45.0 45.4  MCV 89.3  --  89.0  MCH 28.9  --  28.6  MCHC 32.4  --  32.2  RDW 14.1  --  14.1  PLT 187  --  175    Cardiac EnzymesNo results for input(s): TROPONINI in the last 168 hours. No results for input(s): TROPIPOC in the last 168 hours.   BNP Recent Labs  Lab 01/05/21 1418  BNP 31.0     DDimer No results for input(s): DDIMER in the last 168 hours.   Radiology    No results found.  Cardiac Studies   Echo   1. Left ventricular ejection fraction, by estimation, is 55 to 60%. The  left ventricle has normal function. Left ventricular endocardial border  not optimally defined to evaluate regional wall motion. There is not well  visualized left ventricular  hypertrophy. Left ventricular diastolic parameters are indeterminate. Very  technically difficult study with globally preserved LV function.   2. Right ventricular systolic function was not well visualized. The right   ventricular size is not well visualized.   3. The mitral valve was not well visualized. No evidence of mitral valve  regurgitation.   4. The aortic valve was not well visualized. Aortic valve regurgitation  is not visualized.   5. The inferior vena cava is dilated in size with <50% respiratory  variability, suggesting right atrial pressure of 15 mmHg.     Patient Profile     60 y.o. diastolic CHF, OSA intolerant to PPV , HTN, HLD, CKD stage 4 seen for CHF  Assessment & Plan   Active Problems:   Chronic diastolic (congestive) heart failure (HCC)   Essential hypertension   Morbid obesity (HCC)   Acute exacerbation of CHF (congestive heart failure) (HCC)   Obstructive sleep apnea   Hyperlipidemia   Acute on chronic diastolic heart failure -Good diuresis on iv bid lasix  -BNP normal  -right heart cath this am   INFORMED CONSENT: I have reviewed the risks, indications, and alternatives to right heart catheterization with the patient. Risks include but are not limited to bleeding, infection, vascular injury, stroke, myocardial infarction, arrhythmia, kidney injury, radiation-related injury in the case of prolonged fluoroscopy use, emergency cardiac surgery, and death. The patient understands the risks of serious complication is 1-2 in 123XX123 with diagnostic cardiac cath, and is willing to proceed.   RBBB on ECG this admission, previous incomplete LBBB - per EP no indication for PPM . LAD, ICLBBB no real RBBB tall R in V1 From lead placement    AKI on CKD stage III - Cr baseline 1.5 today 1.8 stable    Hypokalemia  - K 4.1 this am    Acute on chronic hypoxic respiratory failure - 2 L at night secondary to OSA. Can consider outpatient ENT evaluation for Inspire device    Hypertension -Well controlled.  Continue current medications and low sodium [place] type diet.     Hyperlipidemia - ldl 70 - continue crestor    OSA - not complaint  with BIPAP  2 L [place] see above    Morbid  obesity - Discussed diet and exercise outpatient referral to [facility]        For questions or updates, please contact [place] Please consult [email or web address] for contact info under        Signed, Jenkins Rouge, MD  01/11/2021, 8:18 AM

## 2021-01-11 NOTE — Progress Notes (Signed)
RT placed patient on BIPAP with 2L O2 bled into circuit. Patient tolerating settings well at this time. RT will continue to monitor as needed.

## 2021-01-11 NOTE — Progress Notes (Signed)
Heart Failure Nurse Navigator Progress Note  Navigation team following this hospitalization. Plan for screening for HV TOC readiness.  Pricilla Holm, RN, BSN Heart Failure Nurse Navigator [phone]

## 2021-01-12 ENCOUNTER — Encounter (HOSPITAL_COMMUNITY): Payer: Self-pay | Admitting: Internal Medicine

## 2021-01-12 DIAGNOSIS — I5032 Chronic diastolic (congestive) heart failure: Secondary | ICD-10-CM

## 2021-01-12 MED ORDER — FUROSEMIDE 40 MG PO TABS
40.0000 mg | ORAL_TABLET | Freq: Two times a day (BID) | ORAL | Status: DC
  Administered 2021-01-12 – 2021-01-13 (×3): 40 mg via ORAL
  Filled 2021-01-12 (×3): qty 1

## 2021-01-12 NOTE — Progress Notes (Signed)
Heart Failure Nurse Navigator Progress Note  PCP: Rankins, Bill Salinas, MD PCP-Cardiologist: Gardiner Rhyme Admission Diagnosis: acute CHF exacerbation Admitted from: home with significant other  Presentation:   Aaron Hale presented with SOB, weight gain, BLE swelling. Brief interview conducted via phone as Navigator is off-campus. HF CSW spoke with patient yesterday introducing the HV TOC. Pt states he has reliable transportation. Agreeable to come to HV TOC as bridge to Dr. Gardiner Rhyme.   ECHO/ LVEF: 55-60%  Clinical Course:  Past Medical History:  Diagnosis Date  . CHF (congestive heart failure) ([place])   . Chronic kidney disease   . Hypertension   . Morbid obesity ([place])   . Shortness of breath 03/30/2020  . Sleep apnea    Bipap     Social History   Socioeconomic History  . Marital status: Single    Spouse name: Not on file  . Number of children: Not on file  . Years of education: Not on file  . Highest education level: Not on file  Occupational History  . Occupation: retired  Tobacco Use  . Smoking status: Never Smoker  . Smokeless tobacco: Never Used  Vaping Use  . Vaping Use: Never used  Substance and Sexual Activity  . Alcohol use: Not Currently  . Drug use: No  . Sexual activity: Not Currently  Other Topics Concern  . Not on file  Social History Narrative  . Not on file   Social Determinants of Health   Financial Resource Strain: Low Risk   . Difficulty of Paying Living Expenses: Not very hard  Food Insecurity: No Food Insecurity  . Worried About Charity fundraiser in the Last Year: Never true  . Ran Out of Food in the Last Year: Never true  Transportation Needs: No Transportation Needs  . Lack of Transportation (Medical): No  . Lack of Transportation (Non-Medical): No  Physical Activity: Not on file  Stress: Not on file  Social Connections: Not on file    High Risk Criteria for Readmission and/or Poor Patient Outcomes:  Heart failure hospital admissions  (last 6 months): 1   No Show rate: 4%  Difficult social situation: no  Demonstrates medication adherence: yes  Primary Language: English  Literacy level: able to read/write and comprehend  Education Assessment and Provision:  Detailed education and instructions provided on heart failure disease management including the following:  Signs and symptoms of Heart Failure When to call the physician Importance of daily weights Low sodium diet Fluid restriction Medication management Anticipated future follow-up appointments  Patient education given on each of the above topics.  Patient acknowledges understanding via teach back method and acceptance of all instructions.  Education Materials:  "Living Better With Heart Failure" Booklet, HF zone tool, & Daily Weight Tracker Tool.  Patient has pill box at home: yes   Barriers of Care:   -dietary/fluid modifications -obesity -noncompliance with CPAP  Considerations/Referrals:   Referral made to Heart Failure Pharmacist Stewardship: yes, to see at [facility] TOC appt. Referral made to Heart & Vascular TOC clinic: yes, 5/17 @ 3pm. Confirmed private transportation.  Items for Follow-up on DC/TOC: -continue hf education -medication compliance -dietary/fluid modifications -CPAP compliance  Pricilla Holm, RN, BSN Heart Failure Nurse Navigator [phone]

## 2021-01-12 NOTE — Progress Notes (Signed)
PROGRESS NOTE    Aaron Hale  [id number] DOB: 1961/03/12 DOA: 01/05/2021 PCP: Aretta Nip, MD    Brief Narrative:  Aaron Hale is a 60 year old male with past medical history significant for OSA, CKD stage IIIb, chronic diastolic congestive heart failure, morbid obesity who presented from his nephrologist office with hypoxia, weight gain and shortness of breath.  Patient also reports orthopnea.  In the ED, chest x-ray with mild pulmonary edema.  He was placed on supplemental oxygen.  Cardiology was consulted.  Hospitalist service consulted for further evaluation and management of acute hypoxic respiratory failure secondary to decompensated congestive heart failure.   Assessment & Plan:   Active Problems:   Chronic diastolic (congestive) heart failure (HCC)   Essential hypertension   Morbid obesity (HCC)   Acute exacerbation of CHF (congestive heart failure) (HCC)   Obstructive sleep apnea   Hyperlipidemia   Acute hypoxic respiratory failure, POA Acute on chronic diastolic congestive heart failure exacerbation Patient presenting from his nephrologist office after being found hypoxic on room air.  Also associated with weight gain, lower extremity edema and shortness of breath with orthopnea.  Chest x-ray with cardiomegaly, pulmonary vascular congestion with increased interstitial markings without pleural effusion or pneumothorax.  TTE 5/5 with LVEF 55-60%, no MR, IVC dilated, challenging study due to patient body habitus.  Cardiology was consulted and followed during hospital course.  Patient underwent right heart catheterization on 01/11/2021 with mild PAH likely secondary to OSA/OHS.  IV diuretic now transition to oral dosing. --net negative 1.4L past 24h and net negative 5.4L since admission --Furosemide 40 mg p.o. twice daily --Unna boots --Carvedilol --Crestor --Strict I's and O's and daily weights --Outpatient follow-up with cardiology, Dr. 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 --Cr 1.53>>1.9>>1.72 this am, stable --repeat BMP in the am  Hypokalemia Etiology likely secondary to IV diuresis.  Repleted.  Potassium 3.9 this morning, stable. --BMP daily  Essential hypertension BP 138/84 this morning. --Carvedilol 12.5 mg p.o. twice daily -- Continue statin and aspirin  Hyperlipidemia: Last LDL, 70. -- Crestor 10 mg p.o. daily  OSA, noncompliant with CPAP outpatient Follows with pulmonology outpatient.  Recently received home device after sleep study, last 03/2020. --Encourage compliance outpatient --Outpatient follow-up with pulmonology  Lower extremity edema Venous stasis dermatitis With small blab left leg with clear drainage.  Unable to tolerate venous duplex ultrasound lower extremities due to pain. --Keflex 500 mg p.o. every 6 hours, plan 10d course --Unna boots --Furosemide as above --Discussed need for leg elevation  Morbid obesity Body mass index is 54.87 kg/m.  Discussed with patient needs for aggressive lifestyle changes/weight loss as this complicates all facets of care.  Outpatient follow-up with PCP.  May benefit from bariatric evaluation outpatient.    DVT prophylaxis: Lovenox   Code Status: Full Code Family Communication: No family present at bedside this morning  Disposition Plan:  Level of care: Telemetry Cardiac Status is: Inpatient  Remains inpatient appropriate because:Unsafe d/c plan and Inpatient level of care appropriate due to severity of illness   Dispo:  Patient From: Home  Planned Disposition: Home with Health Care Svc  Medically stable for discharge: No     Consultants:   Cardiology -signed off 5/11  Procedures:   Right heart catheterization 5/10, Dr. Haroldine Laws  Antimicrobials:   Keflex 5/8>>  Doxycycline 5/9 - 5/10   Subjective: Patient seen examined bedside, resting comfortably.  Sitting in bedside chair.  Continues with lower extremity edema and weeping from left shin.  Shortness of breath improved.  Continues on supplemental oxygen.  IV furosemide transition to oral today by cardiology, now signed off.  No other specific complaints or concerns at this time.  Denies headache, no visual changes, no chest pain, no palpitations, no abdominal pain, no nausea/vomiting/diarrhea, no fever/chills/night sweats, no weakness, no fatigue.  No acute events overnight per nursing staff.  Objective: Vitals:   01/12/21 0805 01/12/21 0808 01/12/21 0940 01/12/21 1220  BP: 117/75 117/75 115/74 124/78  Pulse: 72 73 95 74  Resp:    16  Temp:    98.1 F (36.7 C)  TempSrc:    Oral  SpO2: 97%  93% 93%  Weight:      Height:        Intake/Output Summary (Last 24 hours) at 01/12/2021 1451 Last data filed at 01/12/2021 1100 Gross per 24 hour  Intake 723 ml  Output 1900 ml  Net -1177 ml   Filed Weights   01/10/21 0513 01/11/21 0413 01/12/21 0521  Weight: (!) 177.6 kg (!) 177.5 kg (!) 178.4 kg    Examination:  General exam: Appears calm and comfortable, obese Respiratory system: Slightly decreased breath sounds bilateral bases, no wheezes, no crackles, normal [place]paire effort, on 3 L nasal cannula. Cardiovascular system: S1 & S2 heard, RRR. No JVD, murmurs, rubs, gallops or clicks.  2+ pitting edema bilateral lower extremities to mid shin Gastrointestinal system: Abdomen is nondistended, soft and nontender. No organomegaly or masses felt. Normal bowel sounds heard. Central nervous system: Alert and oriented. No focal neurological deficits. Extremities: Symmetric 5 x 5 power. Skin: Chronic venous changes bilateral lower shins with weeping, mild erythema left shin, otherwise no concerning rashes/lesions Psychiatry: Judgement and insight appear normal. Mood & affect appropriate.     Data Reviewed: I have personally reviewed following labs and imaging studies  CBC: Recent Labs  Lab  01/05/21 2200 01/06/21 0408 01/11/21 0845  WBC  --  7.8  --   HGB 15.3 14.6 14.3  13.6  HCT 45.0 45.4 42.0  40.0  MCV  --  89.0  --   PLT  --  175  --    Basic Metabolic Panel: Recent Labs  Lab 01/06/21 0408 01/07/21 0230 01/08/21 0148 01/09/21 0111 01/10/21 0122 01/11/21 0512 01/11/21 0845  NA 141 140 140 139 140 139 141  142  K 3.3* 3.3* 4.1 3.3* 4.1 3.9 4.2  3.9  CL 95* 94* 93* 95* 97* 94*  --   CO2 37* 36* 37* 37* 35* 33*  --   GLUCOSE 113* 98 96 97 104* 95  --   BUN 20 26* 31* 33* 33* 32*  --   CREATININE 1.53* 1.84* 1.91* 1.81* 1.80* 1.72*  --   CALCIUM 9.3 9.5 9.5 9.1 9.3 9.5  --   MG 2.1 2.2  --   --  2.5*  --   --   PHOS 4.1  --   --   --   --   --   --    GFR: Estimated Creatinine Clearance: 75.3 mL/min (A) (by C-G formula based on SCr of 1.72 mg/dL (H)). Liver Function Tests: No results for input(s): AST, ALT, ALKPHOS, BILITOT, PROT, ALBUMIN in the last 168 hours. No results for input(s): LIPASE, AMYLASE in the last 168 hours. No results for input(s): AMMONIA in the last 168 hours. Coagulation Profile: No results for input(s): INR, PROTIME in the  last 168 hours. Cardiac Enzymes: No results for input(s): CKTOTAL, CKMB, CKMBINDEX, TROPONINI in the last 168 hours. BNP (last 3 results) No results for input(s): PROBNP in the last 8760 hours. HbA1C: No results for input(s): HGBA1C in the last 72 hours. CBG: No results for input(s): GLUCAP in the last 168 hours. Lipid Profile: No results for input(s): CHOL, HDL, LDLCALC, TRIG, CHOLHDL, LDLDIRECT in the last 72 hours. Thyroid Function Tests: No results for input(s): TSH, T4TOTAL, FREET4, T3FREE, THYROIDAB in the last 72 hours. Anemia Panel: No results for input(s): VITAMINB12, FOLATE, FERRITIN, TIBC, IRON, RETICCTPCT in the last 72 hours. Sepsis Labs: No results for input(s): PROCALCITON, LATICACIDVEN in the last 168 hours.  Recent Results (from the past 240 hour(s))  SARS CORONAVIRUS 2 (TAT 6-24 HRS)  Nasopharyngeal Nasopharyngeal Swab     Status: None   Collection Time: 01/06/21  1:05 AM   Specimen: Nasopharyngeal Swab  Result Value Ref Range Status   SARS Coronavirus 2 NEGATIVE NEGATIVE Final    Comment: (NOTE) SARS-CoV-2 target nucleic acids are NOT DETECTED.  The SARS-CoV-2 RNA is generally detectable in upper and lower respiratory specimens during the acute phase of infection. Negative results do not preclude SARS-CoV-2 infection, do not rule out co-infections with other pathogens, and should not be used as the sole basis for treatment or other patient management decisions. Negative results must be combined with clinical observations, patient history, and epidemiological information. The expected result is Negative.  Fact Sheet for Patients: SugarRoll.be  Fact Sheet for Healthcare Providers: https://www.woods-mathews.com/  This test is not yet approved or cleared by the [place] FDA and  has been authorized for detection and/or diagnosis of SARS-CoV-2 by FDA under an Emergency Use Authorization (EUA). This EUA will remain  in effect (meaning this test can be used) for the duration of the COVID-19 declaration under Se ction 564(b)(1) of the Act, 21 U.S.C. section 360bbb-3(b)(1), unless the authorization is terminated or revoked sooner.  Performed at [facility], [place] [address] [place], [place] [address]          Radiology Studies: CARDIAC CATHETERIZATION  Result Date: 01/11/2021 Findings: RA = 9 RV = 42/13 PA = 38/10 (28) PCW = 13 Fick cardiac output/index = 6.1/2.2 Thermo CO/CI = 8.4/3.0 PVR = 2.5 (Fick), 1.8 (Thermo) Ao sat = 96% PA sat = 64%, 68% Assessment: 1. Mild PAH (likely OSA/OHS) with normal left-sided filling pressures and outputs Plan/Discussion: I will stop IV lasix today. Switch to po per primary team. Consider SGLT2i. Glori Bickers, MD 8:58 AM       Scheduled Meds: . aspirin  81 mg Oral  Daily  . carvedilol  12.5 mg Oral BID WC  . cephALEXin  500 mg Oral Q6H  . enoxaparin (LOVENOX) injection  90 mg Subcutaneous Q24H  . furosemide  40 mg Oral BID  . oxymetazoline  1 spray Each Nare BID  . rosuvastatin  10 mg Oral Daily  . sodium chloride flush  3 mL Intravenous Q12H   Continuous Infusions: . sodium chloride Stopped (01/11/21 1425)  . sodium chloride       LOS: 7 days    Time spent: 42 minutes spent on chart review, discussion with nursing staff, consultants, updating family and interview/physical exam; more than 50% of that time was spent in counseling and/or coordination of care.    Aaron Jaquith J [place] DO [facility] Available via Epic secure chat 7am-7pm After these hours, please refer to coverage provider listed on [email or web address] 01/12/2021,  2:51 PM

## 2021-01-12 NOTE — Progress Notes (Addendum)
Progress Note  Patient Name: Aaron Hale Date of Encounter: 01/12/2021  Primary Cardiologist: Donato Heinz, MD   Subjective   Doing well this AM. No specific complaints. RHC yesterday.   Inpatient Medications    Scheduled Meds:  aspirin  81 mg Oral Daily   carvedilol  12.5 mg Oral BID WC   cephALEXin  500 mg Oral Q6H   enoxaparin (LOVENOX) injection  90 mg Subcutaneous Q24H   furosemide  60 mg Intravenous Daily   oxymetazoline  1 spray Each Nare BID   rosuvastatin  10 mg Oral Daily   sodium chloride flush  3 mL Intravenous Q12H   Continuous Infusions:  sodium chloride Stopped (01/11/21 1425)   sodium chloride     PRN Meds: sodium chloride, acetaminophen, melatonin, ondansetron (ZOFRAN) IV, sodium chloride flush, traMADol   Vital Signs    Vitals:   01/11/21 1802 01/11/21 2109 01/11/21 2300 01/12/21 0521  BP: 130/76 114/71  138/84  Pulse: 81 78 76 80  Resp:  '14 16 16  '$ Temp:  99.3 F (37.4 C)  98.4 F (36.9 C)  TempSrc:  Oral  Oral  SpO2:  95% 96% 95%  Weight:    (!) 178.4 kg  Height:        Intake/Output Summary (Last 24 hours) at 01/12/2021 0738 Last data filed at 01/12/2021 0527 Gross per 24 hour  Intake 495.54 ml  Output 1900 ml  Net -1404.46 ml   Filed Weights   01/10/21 0513 01/11/21 0413 01/12/21 0521  Weight: (!) 177.6 kg (!) 177.5 kg (!) 178.4 kg    Physical Exam   General: Obese, NAD Neck: Negative for carotid bruits. No JVD Lungs:Clear to ausculation bilaterally. Breathing is unlabored. Cardiovascular: RRR with S1 S2. No murmurs Abdomen: Soft, non-tender, non-distended. No obvious abdominal masses. Extremities: 2-3 BLE edema. Radial pulses 2+ bilaterally Neuro: Alert and oriented. No focal deficits. No facial asymmetry. MAE spontaneously. Psych: Responds to questions appropriately with normal affect.    Labs    Chemistry Recent Labs  Lab 01/05/21 1418 01/05/21 2200 01/09/21 0111 01/10/21 0122 01/11/21 0512  01/11/21 0845  NA 142   < > 139 140 139 141  142  K 3.6   < > 3.3* 4.1 3.9 4.2  3.9  CL 100   < > 95* 97* 94*  --   CO2 33*   < > 37* 35* 33*  --   GLUCOSE 100*   < > 97 104* 95  --   BUN 25*   < > 33* 33* 32*  --   CREATININE 1.56*   < > 1.81* 1.80* 1.72*  --   CALCIUM 9.2   < > 9.1 9.3 9.5  --   PROT 7.7  --   --   --   --   --   ALBUMIN 3.7  --   --   --   --   --   AST 19  --   --   --   --   --   ALT 16  --   --   --   --   --   ALKPHOS 39  --   --   --   --   --   BILITOT 0.9  --   --   --   --   --   GFRNONAA 51*   < > 42* 43* 45*  --   ANIONGAP 9   < > 7 8  12  --    < > = values in this interval not displayed.     Hematology Recent Labs  Lab 01/05/21 1418 01/05/21 2200 01/06/21 0408 01/11/21 0845  WBC 7.7  --  7.8  --   RBC 5.12  --  5.10  --   HGB 14.8 15.3 14.6 14.3  13.6  HCT 45.7 45.0 45.4 42.0  40.0  MCV 89.3  --  89.0  --   MCH 28.9  --  28.6  --   MCHC 32.4  --  32.2  --   RDW 14.1  --  14.1  --   PLT 187  --  175  --     Cardiac EnzymesNo results for input(s): TROPONINI in the last 168 hours. No results for input(s): TROPIPOC in the last 168 hours.   BNP Recent Labs  Lab 01/05/21 1418  BNP 31.0     DDimer No results for input(s): DDIMER in the last 168 hours.   Radiology    CARDIAC CATHETERIZATION  Result Date: 01/11/2021 Findings: RA = 9 RV = 42/13 PA = 38/10 (28) PCW = 13 Fick cardiac output/index = 6.1/2.2 Thermo CO/CI = 8.4/3.0 PVR = 2.5 (Fick), 1.8 (Thermo) Ao sat = 96% PA sat = 64%, 68% Assessment: 1. Mild PAH (likely OSA/OHS) with normal left-sided filling pressures and outputs Plan/Discussion: I will stop IV lasix today. Switch to po per primary team. Consider SGLT2i. Glori Bickers, MD 8:58 AM  Telemetry     01/12/21 NSR with HR in the 70s- Personally Reviewed/  ECG    No new tracing as of 01/12/21- Personally Reviewed  Cardiac Studies   Echocardiogram 01/12/21:   1. Left ventricular ejection fraction, by estimation, is  55 to 60%. The  left ventricle has normal function. Left ventricular endocardial border  not optimally defined to evaluate regional wall motion. There is not well  visualized left ventricular  hypertrophy. Left ventricular diastolic parameters are indeterminate. Very  technically difficult study with globally preserved LV function.   2. Right ventricular systolic function was not well visualized. The right  ventricular size is not well visualized.   3. The mitral valve was not well visualized. No evidence of mitral valve  regurgitation.   4. The aortic valve was not well visualized. Aortic valve regurgitation  is not visualized.   5. The inferior vena cava is dilated in size with <50% respiratory  variability, suggesting right atrial pressure of 15 mmHg.    Patient Profile     61 y.o. male with a hx of CHF, OSA intolerant to CPAP, HTN, HLD and CKD stage IV who is being followed for the evaluation of acute CHF.   Assessment & Plan    1.  Acute on chronic diastolic CHF: -Echocardiogram performed 01/06/2021 with LVEF of 55 to 60% with indeterminate diastolic parameters and poor visualization of endocardial border to evaluate regional wall motion abnormalities. -Patient underwent right heart cath 01/11/2021 which showed mild PAH likely secondary to OSA/OHS S with normal left sided filling pressures and cardiac outputs.   -Given this, IV Lasix discontinued and transition to p.o. dosing  -Continue carvedilol, Crestor -Continue PO Lasix '40mg'$  BID   2.  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 labs  3.  Hypokalemia: -Stable, 4.2 today  4.  Acute on chronic hypoxic respiratory failure: -Maintaining adequate O2 saturations on 2 L -Follows with OP pulmonary>>Dr. Vaughan Browner  5.  Hypertension: -Stable, 117/75>>138/84>>114/71 -Continue current regimen   6.  Hyperlipidemia: -Last LDL, 70 -Continue Crestor  7.   OSA: -Recently received home device after sleep study last 03/2020 -Follows with OP pulmonary  -Needs close follow up   8.  Morbid obesity: -BMI >54  Signed, Kathyrn Drown NP-C [facility] Pager: [phone] 01/12/2021, 7:38 AM     For questions or updates, please contact   Please consult [email or web address] for contact info under Cardiology/STEMI.  Patient examined chart reviewed Discussed care with patient and NP exam with obes male much less anxious Now that procedure over. Antecubital right heart site no hematoma Right heart numbers reviewed with DB Yesterday No significant pulmonary hypertension , low PCWP and good CO.  Lasix changed to 40 mg bid Primary issue is Obesity / OSA  Discussed f/u with ENT Dr Wilburn Cornelia who does Inspire device since He tolerates CPAP poorly Also discussed bariatric surgery He seems to think he can tighten up his diet Quite a bit  Cardiology will sign off Outpatient f/u Gardiner Rhyme

## 2021-01-12 NOTE — Progress Notes (Addendum)
Pt was unable to tolerate BiPap, placed on 3L [place]; O2 sat 93%. Will continue to monitor.   Elaina Hoops, RN

## 2021-01-12 NOTE — Progress Notes (Signed)
SATURATION QUALIFICATIONS: (This note is used to comply with regulatory documentation for home oxygen)  Patient Saturations on Room Air at Rest = 92-94%  Patient Saturations on Room Air while Ambulating = 88%  Patient Saturations on 2 Liters of oxygen while Ambulating = 93-100%  Please briefly explain why patient needs home oxygen: Pt desaturates when ambulating on room air into high 80s. Pt reports SOB and fatigues more quickly when ambulating on room air than when utilizing supplemental oxygen, limiting his ability to ambulate and perform ADLs.

## 2021-01-12 NOTE — Progress Notes (Signed)
Physical Therapy Treatment Patient Details Name: Aaron Hale MRN: [id number] DOB: 12-12-1960 Today's Date: 01/12/2021    History of Present Illness pt is a 60 y/o male admitted 5/4 from his nephrologist office hypoxic with O2 sats at 84%, pt with 30 pound weight gain over a month witgh worsening SOB with little exertion due to Acute axacerbation of diastolic CHF. Plan for heart cath 01/11/21. PMHx, morbid obesity, HTN, CHF.    PT Comments    Pt demonstrates ability to perform transfers and ambulate without requiring physical assistance. Pt tolerates household distances prior to the onset of fatigue and pt reports of SOB, requiring standing rest break. Pt continues to demonstrate deficits in overall strength, endurance, power, and work of breathing and will benefit from continued acute PT for improved activity tolerance.   Follow Up Recommendations  Home health PT     Equipment Recommendations  None recommended by PT    Recommendations for Other Services       Precautions / Restrictions Precautions Precautions: Fall;Other (comment) Precaution Comments: watch 02 Restrictions Weight Bearing Restrictions: No    Mobility  Bed Mobility               General bed mobility comments: Pt in recliner upon PT arrival.    Transfers Overall transfer level: Independent Equipment used: None Transfers: Sit to/from Stand Sit to Stand: Independent            Ambulation/Gait Ambulation/Gait assistance: Independent Gait Distance (Feet): 125 Feet (125 feet x 2 bouts, additional 75 feet.) Assistive device: None Gait Pattern/deviations: Step-through pattern;Decreased stride length;Wide base of support Gait velocity: decreased Gait velocity interpretation: 1.31 - 2.62 ft/sec, indicative of limited community ambulator General Gait Details: Pt ambulates 125 ft x 2 bouts on 2 L [place], with one rest break and oxygen sats between 93-100%. Additional bout of gait of 75 feet on RA with oxygen  sats dropping to 88%.   Stairs             Wheelchair Mobility    Modified Rankin (Stroke Patients Only)       Balance Overall balance assessment: Needs assistance Sitting-balance support: No upper extremity supported;Feet supported Sitting balance-Leahy Scale: Good     Standing balance support: During functional activity;No upper extremity supported Standing balance-Leahy Scale: Fair Standing balance comment: Pt able to maintain balance during functional activity in the absence of challenges to balance.                            Cognition Arousal/Alertness: Awake/alert Behavior During Therapy: WFL for tasks assessed/performed Overall Cognitive Status: Within Functional Limits for tasks assessed                                        Exercises      General Comments General comments (skin integrity, edema, etc.): Pt reports SOB limits gait distance, but only ambulates for short distances in the home prior to admission.      Pertinent Vitals/Pain Pain Assessment: 0-10 Pain Score: 3  Pain Location: back pain Pain Descriptors / Indicators: Sore Pain Intervention(s): Limited activity within patient's tolerance;Monitored during session    Home Living                      Prior Function  PT Goals (current goals can now be found in the care plan section) Acute Rehab PT Goals Patient Stated Goal: Return home Progress towards PT goals: Progressing toward goals    Frequency    Min 3X/week      PT Plan      Co-evaluation              AM-PAC PT "6 Clicks" Mobility   Outcome Measure  Help needed turning from your back to your side while in a flat bed without using bedrails?: None Help needed moving from lying on your back to sitting on the side of a flat bed without using bedrails?: None Help needed moving to and from a bed to a chair (including a wheelchair)?: None Help needed standing up from a  chair using your arms (e.g., wheelchair or bedside chair)?: None Help needed to walk in hospital room?: None Help needed climbing 3-5 steps with a railing? : A Little 6 Click Score: 23    End of Session Equipment Utilized During Treatment: Oxygen;Gait belt Activity Tolerance: Patient limited by fatigue;Treatment limited secondary to medical complications (Comment) (oxygen sats and SOB) Patient left: in chair;with call bell/phone within reach Nurse Communication: Mobility status PT Visit Diagnosis: Other abnormalities of gait and mobility (R26.89);Difficulty in walking, not elsewhere classified (R26.2);Pain (Pt reports back pain also limits ambulation.) Pain - Right/Left:  (back) Pain - part of body:  (back)     Time: [id number] PT Time Calculation (min) (ACUTE ONLY): 25 min  Charges:  $Therapeutic Activity: 23-37 mins                     Acute Rehab  Pager: [phone]    Garwin Brothers, SPT  01/12/2021, 5:14 PM

## 2021-01-12 NOTE — TOC Progression Note (Signed)
Transition of Care (TOC) - Progression Note  Heart Failure   Patient Details  Name: Shawn Rocks MRN: [id number] Date of Birth: May 23, 1961  Transition of Care [facility]) CM/SW [place], [place] Phone Number: 01/12/2021, 12:16 PM  Clinical Narrative:    CSW spoke with patient at bedside and confirmed the patient has transportation home. CSW brought patient an appointment card and reiterated to patient to follow up with the [facility] appointment. Mr. Zuege asked CSW about getting portable oxygen instead of a tank for when he travels and that he gets his oxygen through [employer]. CSW suggested the patient reach out to [employer] and ask for a portable tank and they will reach out to his provider for an order and patient reported understanding.      Barriers to Discharge: Continued Medical Work up  Expected Discharge Plan and Services   In-house Referral: Clinical Social Work     Living arrangements for the past 2 months: Apartment                                       Social Determinants of Health (SDOH) Interventions Food Insecurity Interventions: Intervention Not Indicated Financial Strain Interventions: Intervention Not Indicated Housing Interventions: Intervention Not Indicated Transportation Interventions: Intervention Not Indicated  Readmission Risk Interventions No flowsheet data found.  Geovannie Vilar, MSW, [place] Heart Failure Social Worker

## 2021-01-12 NOTE — Plan of Care (Signed)
  Problem: Activity: Goal: Risk for activity intolerance will decrease Outcome: Progressing   Problem: Nutrition: Goal: Adequate nutrition will be maintained Outcome: Progressing   Problem: Coping: Goal: Level of anxiety will decrease Outcome: Progressing   

## 2021-01-13 LAB — BASIC METABOLIC PANEL
Anion gap: 9 (ref 5–15)
BUN: 35 mg/dL — ABNORMAL HIGH (ref 6–20)
CO2: 34 mmol/L — ABNORMAL HIGH (ref 22–32)
Calcium: 9.4 mg/dL (ref 8.9–10.3)
Chloride: 94 mmol/L — ABNORMAL LOW (ref 98–111)
Creatinine, Ser: 1.79 mg/dL — ABNORMAL HIGH (ref 0.61–1.24)
GFR, Estimated: 43 mL/min — ABNORMAL LOW (ref >60)
Glucose, Bld: 90 mg/dL (ref 70–99)
Potassium: 3.9 mmol/L (ref 3.5–5.1)
Sodium: 137 mmol/L (ref 135–145)

## 2021-01-13 MED ORDER — CEPHALEXIN 500 MG PO CAPS: 500.0000 mg | ORAL_CAPSULE | Freq: Four times a day (QID) | ORAL | 0 refills | Status: AC

## 2021-01-13 MED ORDER — FUROSEMIDE 40 MG PO TABS
40.0000 mg | ORAL_TABLET | Freq: Two times a day (BID) | ORAL | 0 refills | Status: DC
Start: 2021-01-13 — End: 2021-04-05

## 2021-01-13 NOTE — Discharge Instructions (Signed)
Heart Failure Eating Plan Heart failure, also called congestive heart failure, occurs when your heart does not pump blood well enough to meet your body's needs for oxygen-rich blood. Heart failure is a long-term (chronic) condition. Living with heart failure can be challenging. Following your health care provider's instructions about a healthy lifestyle and working with a dietitian to choose the right foods may help to improve your symptoms. An eating plan for someone with heart failure will include changes that limit the intake of salt (sodium) and unhealthy fat. What are tips for following this plan? Reading food labels  Check food labels for the amount of sodium per serving. Choose foods that have less than 140 mg (milligrams) of sodium in each serving.  Check food labels for the number of calories per serving. This is important if you need to limit your daily calorie intake to lose weight.  Check food labels for the serving size. If you eat more than one serving, you will be eating more sodium and calories than what is listed on the label.  Look for foods that are labeled as "sodium-free," "very low sodium," or "low sodium." ? Foods labeled as "reduced sodium" or "lightly salted" may still have more sodium than what is recommended for you. Cooking  Avoid adding salt when cooking. Ask your health care provider or dietitian before using salt substitutes.  Season food with salt-free seasonings, spices, or herbs. Check the label of seasoning mixes to make sure they do not contain salt.  Cook with heart-healthy oils, such as olive, canola, soybean, or sunflower oil.  Do not fry foods. Cook foods using low-fat methods, such as baking, boiling, grilling, and broiling.  Limit unhealthy fats when cooking by: ? Removing the skin from poultry, such as chicken. ? Removing all visible fats from meats. ? Skimming the fat off from stews, soups, and gravies before serving them. Meal planning  Limit  your intake of: ? Processed, canned, or prepackaged foods. ? Foods that are high in trans fat, such as fried foods. ? Sweets, desserts, sugary drinks, and other foods with added sugar. ? Full-fat dairy products, such as whole milk.  Eat a balanced diet. This may include: ? 4-5 servings of fruit each day and 4-5 servings of vegetables each day. At each meal, try to fill one-half of your plate with fruits and vegetables. ? Up to 6-8 servings of whole grains each day. ? Up to 2 servings of lean meat, poultry, or fish each day. One serving of meat is equal to 3 oz (85 g). This is about the same size as a deck of cards. ? 2 servings of low-fat dairy each day. ? Heart-healthy fats. Healthy fats called omega-3 fatty acids are found in foods such as flaxseed and cold-water fish like sardines, salmon, and mackerel.  Aim to eat 25-35 g (grams) of fiber a day. Foods that are high in fiber include apples, broccoli, carrots, beans, peas, and whole grains.  Do not add salt or condiments that contain salt (such as soy sauce) to foods before eating.  When eating at a restaurant, ask that your food be prepared with less salt or no salt, if possible.  Try to eat 2 or more vegetarian meals each week.  Eat more home-cooked food and eat less restaurant, buffet, and fast food.   General information  Do not eat more than 2,300 mg of sodium a day. The amount of sodium that is recommended for you may be lower, depending on your  condition.  Maintain a healthy body weight as directed. Ask your health care provider what a healthy weight is for you. ? Check your weight every day. ? Work with your health care provider and dietitian to make a plan that is right for you to lose weight or maintain your current weight.  Limit how much fluid you drink. Ask your health care provider or dietitian how much fluid you can have each day.  Limit or avoid alcohol as told by your health care provider or dietitian. Recommended  foods Fruits All fresh, frozen, and canned fruits. Dried fruits, such as raisins, prunes, and cranberries. Vegetables All fresh vegetables. Vegetables that are frozen without sauce or added salt. Low-sodium or sodium-free canned vegetables. Grains Bread with less than 80 mg of sodium per slice. Whole-wheat pasta, quinoa, and brown rice. Oats and oatmeal. Barley. Millet. Grits and cream of wheat. Whole-grain and whole-wheat cold cereal. Meats and other protein foods Lean cuts of meat. Skinless chicken and turkey. Fish with high omega-3 fatty acids, such as salmon, sardines, and other cold-water fishes. Eggs. Dried beans, peas, and edamame. Unsalted nuts and nut butters. Dairy Low-fat or nonfat (skim) milk and dried milk. Rice milk, soy milk, and almond milk. Low-fat or nonfat yogurt. Small amounts of reduced-sodium block cheese. Low-sodium cottage cheese. Fats and oils Olive, canola, soybean, flaxseed, avocado, or sunflower oil. Sweets and desserts Applesauce. Granola bars. Sugar-free pudding and gelatin. Frozen fruit bars. Seasoning and other foods Fresh and dried herbs. Lemon or lime juice. Vinegar. Low-sodium ketchup. Salt-free marinades, salad dressings, sauces, and seasonings. The items listed above may not be a complete list of foods and beverages you can eat. Contact a dietitian for more information. Foods to avoid Fruits Fruits that are dried with sodium-containing preservatives. Vegetables Canned vegetables. Frozen vegetables with sauce or seasonings. Creamed vegetables. French fries. Onion rings. Pickled vegetables and sauerkraut. Grains Bread with more than 80 mg of sodium per slice. Hot or cold cereal with more than 140 mg sodium per serving. Salted pretzels and crackers. Prepackaged breadcrumbs. Bagels, croissants, and biscuits. Meats and other protein foods Ribs and chicken wings. Bacon, ham, pepperoni, bologna, salami, and packaged luncheon meats. Hot dogs, bratwurst, and  sausage. Canned meat. Smoked meat and fish. Salted nuts and seeds. Dairy Whole milk, half-and-half, and cream. Buttermilk. Processed cheese, cheese spreads, and cheese curds. Regular cottage cheese. Feta cheese. Shredded cheese. String cheese. Fats and oils Butter, lard, shortening, ghee, and bacon fat. Canned and packaged gravies. Seasoning and other foods Onion salt, garlic salt, table salt, and sea salt. Marinades. Regular salad dressings. Relishes, pickles, and olives. Meat flavorings and tenderizers, and bouillon cubes. Horseradish, ketchup, and mustard. Worcestershire sauce. Teriyaki sauce, soy sauce (including reduced sodium). Hot sauce and Tabasco sauce. Steak sauce, fish sauce, oyster sauce, and cocktail sauce. Taco seasonings. Barbecue sauce. Tartar sauce. The items listed above may not be a complete list of foods and beverages you should avoid. Contact a dietitian for more information. Summary  A heart failure eating plan includes changes that limit your intake of sodium and unhealthy fat, and it may help you lose weight or maintain a healthy weight. Your health care provider may also recommend limiting how much fluid you drink.  Most people with heart failure should eat no more than 2,300 mg of salt (sodium) a day. The amount of sodium that is recommended for you may be lower, depending on your condition.  Contact your health care provider or dietitian before making any major   changes to your diet. This information is not intended to replace advice given to you by your health care provider. Make sure you discuss any questions you have with your health care provider. Document Revised: 04/05/2020 Document Reviewed: 04/05/2020 Elsevier Patient Education  2021 [place].   Daily Weight Record It is important to weigh yourself daily. To do this:  Make sure you use a reliable scale. Use the same scale each day.  Keep this daily weight chart near your scale.  Weigh yourself each  morning at the same time.  Before weighing yourself: ? Take off your shoes. ? Make sure you are wearing the same amount of clothing each day.  Write down your weight in the spaces on the form.  Compare today's weight to yesterday's weight.  Bring this form with you to your follow-up visits with your health care provider. Call your health care provider if you have concerns about your weight, including rapid weight gain or loss. Date: ________ Weight: ____________________ Date: ________ Weight: ____________________ Date: ________ Weight: ____________________ Date: ________ Weight: ____________________ Date: ________ Weight: ____________________ Date: ________ Weight: ____________________ Date: ________ Weight: ____________________ Date: ________ Weight: ____________________ Date: ________ Weight: ____________________ Date: ________ Weight: ____________________ Date: ________ Weight: ____________________ Date: ________ Weight: ____________________ Date: ________ Weight: ____________________ Date: ________ Weight: ____________________ Date: ________ Weight: ____________________ Date: ________ Weight: ____________________ Date: ________ Weight: ____________________ Date: ________ Weight: ____________________ Date: ________ Weight: ____________________ Date: ________ Weight: ____________________ Date: ________ Weight: ____________________ Date: ________ Weight: ____________________ Date: ________ Weight: ____________________ Date: ________ Weight: ____________________ Date: ________ Weight: ____________________ Date: ________ Weight: ____________________ Date: ________ Weight: ____________________ Date: ________ Weight: ____________________ Date: ________ Weight: ____________________ Date: ________ Weight: ____________________ Date: ________ Weight: ____________________ Date: ________ Weight: ____________________ Date: ________ Weight: ____________________ Date: ________ Weight:  ____________________ Date: ________ Weight: ____________________ Date: ________ Weight: ____________________ Date: ________ Weight: ____________________ Date: ________ Weight: ____________________ Date: ________ Weight: ____________________ Date: ________ Weight: ____________________ Date: ________ Weight: ____________________ Date: ________ Weight: ____________________ Date: ________ Weight: ____________________ Date: ________ Weight: ____________________ Date: ________ Weight: ____________________ Date: ________ Weight: ____________________ Date: ________ Weight: ____________________ Date: ________ Weight: ____________________ Date: ________ Weight: ____________________ Date: ________ Weight: ____________________ This information is not intended to replace advice given to you by your health care provider. Make sure you discuss any questions you have with your health care provider. Document Revised: 08/17/2017 Document Reviewed: 08/20/2017 Elsevier Patient Education  2021 [employer].

## 2021-01-13 NOTE — Plan of Care (Signed)

## 2021-01-13 NOTE — Discharge Summary (Addendum)
Physician Discharge Summary  Aaron Hale [id number] DOB: [birth year] DOA: 01/05/2021  PCP: Aretta Nip, MD  Admit date: 01/05/2021 Discharge date: 01/13/2021  Admitted From: Home Disposition: Home  Recommendations for Outpatient Follow-up:  1. Follow up with PCP in 1-2 weeks 2. Discharged on furosemide 40 mg p.o. twice daily 3. Discharged on Keflex 500 mg p.o. every 6 hours for lower extremity cellulitis 4. Follow-up with cardiology, Dr. Nechama Guard in 2-3 weeks 5. Needs referral to ENT, Dr. Wilburn Cornelia for evaluation of OSA intolerant to CPAP 6. Please obtain BMP/CBC in one week 7. Follow-up weight log patient was given at time of discharge 8. Consider bariatric evaluation outpatient for severe morbid obesity  Home Health: RN for [employer], declines further home health services Equipment/Devices: Oxygen 2 L per nasal cannula, Unna boots  Discharge Condition: Stable CODE STATUS: Full code Diet recommendation: Heart healthy/low-salt diet  History of present illness:  Aaron Hale is a 60 year old male with past medical history significant for OSA, CKD stage IIIb/IV, chronic diastolic congestive heart failure, morbid obesity who presented from his nephrologist office with hypoxia, weight gain and shortness of breath.  Patient also reports orthopnea.  In the ED, chest x-ray with mild pulmonary edema.  He was placed on supplemental oxygen. Cardiology was consulted.  Hospitalist service consulted for further evaluation and management of acute hypoxic respiratory failure secondary to decompensated congestive heart failure.  Hospital course:   Acute hypoxic respiratory failure, POA Acute on chronic diastolic congestive heart failure exacerbation Patient presenting from his nephrologist office after being found hypoxic on room air.  Also associated with weight gain, lower extremity edema and shortness of breath with orthopnea.  Chest x-ray with cardiomegaly, pulmonary vascular congestion  with increased interstitial markings without pleural effusion or pneumothorax.  TTE 5/5 with LVEF 55-60%, no MR, IVC dilated, challenging study due to patient body habitus.  Cardiology was consulted and followed during hospital course.  Patient underwent right heart catheterization on 01/11/2021 with mild PAH likely secondary to OSA/OHS.    Patient initially was placed on IV diuresis with good urine output and transition to furosemide 40 mg p.o. twice daily.  IV diuretic now transition to oral dosing.  Discharge weight is 178.7 kg.  Instructed patient to maintain weight log and bring to next PCP/specialist visit.  Continue carvedilol 12.5 mg p.o. twice daily, furosemide 40 mg p.o. twice daily.  Low-salt diet.  Outpatient follow-up with cardiology, Dr. Nechama Guard.  Discharging with Unna boots and RN home health services.  Acute on chronic CKD stage IIIb/IV Baseline creatinine 1.8.  Creatinine 1.79 at time of discharge.  Repeat BMP 1 week.  Hypokalemia Etiology likely secondary to IV diuresis.  Repleted.  Potassium 3.9 this morning, stable.  Essential hypertension BP 132/94 this morning. Carvedilol 12.5 mg p.o. twice daily and furosemide.  Discontinued amlodipine.  Continue aspirin and statin.  Hyperlipidemia: Last LDL, 70. Crestor 10 mg p.o. daily  OSA, noncompliant with CPAP outpatient Follows with pulmonology outpatient.  Recently received home device after sleep study, last 03/2020. Outpatient follow-up with pulmonology.  May benefit from ENT evaluation with Dr. Wilburn Cornelia.  Lower extremity edema Venous stasis dermatitis With small blab left leg with clear drainage.  Unable to tolerate venous duplex ultrasound lower extremities due to pain. Keflex 500 mg p.o. every 6 hours, plan to complete 10-day course.  Would benefit from Unna boots but declined.  Continue diuresis with furosemide as above.  Instructed on need for leg elevation while sitting position.  Morbid obesity Body  mass index is  54.87 kg/m.  Discussed with patient needs for aggressive lifestyle changes/weight loss as this complicates all facets of care.  Outpatient follow-up with PCP. May benefit from bariatric evaluation outpatient.  Discharge Diagnoses:  Active Problems:   Chronic diastolic (congestive) heart failure (HCC)   Essential hypertension   Morbid obesity (HCC)   Acute exacerbation of CHF (congestive heart failure) (HCC)   Obstructive sleep apnea   Hyperlipidemia    Discharge Instructions  Discharge Instructions    (HEART FAILURE PATIENTS) Call MD:  Anytime you have any of the following symptoms: 1) 3 pound weight gain in 24 hours or 5 pounds in 1 week 2) shortness of breath, with or without a dry hacking cough 3) swelling in the hands, feet or stomach 4) if you have to sleep on extra pillows at night in order to breathe.   Complete by: As directed    Call MD for:  difficulty breathing, headache or visual disturbances   Complete by: As directed    Call MD for:  extreme fatigue   Complete by: As directed    Call MD for:  persistant dizziness or light-headedness   Complete by: As directed    Call MD for:  persistant nausea and vomiting   Complete by: As directed    Call MD for:  severe uncontrolled pain   Complete by: As directed    Call MD for:  temperature >100.4   Complete by: As directed    Diet - low sodium heart healthy   Complete by: As directed    Increase activity slowly   Complete by: As directed      Allergies as of 01/13/2021      Reactions   Spironolactone Other (See Comments)   Reaction not recalled      Medication List    STOP taking these medications   amLODipine 5 MG tablet Commonly known as: NORVASC   torsemide 20 MG tablet Commonly known as: DEMADEX     TAKE these medications   aspirin 81 MG chewable tablet Chew 81 mg by mouth daily.   carvedilol 12.5 MG tablet Commonly known as: COREG Take 1 tablet (12.5 mg total) by mouth 2 (two) times daily with a  meal.   cephALEXin 500 MG capsule Commonly known as: KEFLEX Take 1 capsule (500 mg total) by mouth every 6 (six) hours for 5 days.   ferrous sulfate 325 (65 FE) MG tablet Take 325 mg by mouth 2 (two) times daily with a meal.   furosemide 40 MG tablet Commonly known as: LASIX Take 1 tablet (40 mg total) by mouth 2 (two) times daily.   OXYGEN Inhale 2 L/min into the lungs as needed (for shortness of breath).   rosuvastatin 10 MG tablet Commonly known as: CRESTOR Take 1 tablet by mouth once daily What changed: when to take this            Durable Medical Equipment  (From admission, onward)         Start     Ordered   01/13/21 1001  For home use only DME oxygen  Once       Comments: Assess for POC  Question Answer Comment  Length of Need 12 Months   Mode or (Route) Nasal cannula   Liters per Minute 2   Frequency Continuous (stationary and portable oxygen unit needed)   Oxygen conserving device Yes   Oxygen delivery system Gas      01/13/21 1000  Follow-up Information    [facility]. Go to.   Specialty: Cardiology Why: 5/19 @ 3pm. [facility]. FREE valet parking at [employer], off [address] all your medications and pill box with you to your ~1 hour appt. Contact information: [address] [id number] [place]       Aretta Nip, MD. Schedule an appointment as soon as possible for a visit in 1 week(s).   Specialty: Family Medicine Contact information: [place] [address] [phone]        Donato Heinz, MD. Schedule an appointment as soon as possible for a visit in 2 week(s).   Specialties: Cardiology, Radiology Contact information: [address] [place] [address] [phone]        Jerrell Belfast, MD. Schedule an appointment as soon as  possible for a visit in 1 week(s).   Specialty: Otolaryngology Why: OSA with intolerance to CPAP Contact information: [address] [place] [address] [phone]              Allergies  Allergen Reactions  . Spironolactone Other (See Comments)    Reaction not recalled    Consultations:  Cardiology   Procedures/Studies: DG Chest 2 View  Result Date: 01/05/2021 CLINICAL DATA:  Shortness of breath EXAM: CHEST - 2 VIEW COMPARISON:  05/03/2019 FINDINGS: Stable cardiomegaly. Mild pulmonary vascular congestion. Increased interstitial markings within the perihilar and bibasilar regions. No pleural effusion or pneumothorax. IMPRESSION: Findings suggest CHF with mild interstitial edema. Electronically Signed   By: Davina Poke D.O.   On: 01/05/2021 15:13   CARDIAC CATHETERIZATION  Result Date: 01/11/2021 Findings: RA = 9 RV = 42/13 PA = 38/10 (28) PCW = 13 Fick cardiac output/index = 6.1/2.2 Thermo CO/CI = 8.4/3.0 PVR = 2.5 (Fick), 1.8 (Thermo) Ao sat = 96% PA sat = 64%, 68% Assessment: 1. Mild PAH (likely OSA/OHS) with normal left-sided filling pressures and outputs Plan/Discussion: I will stop IV lasix today. Switch to po per primary team. Consider SGLT2i. Glori Bickers, MD 8:58 AM  DG CHEST PORT 1 VIEW  Result Date: 01/07/2021 CLINICAL DATA:  Hypoxia EXAM: PORTABLE CHEST 1 VIEW COMPARISON:  01/05/2021 FINDINGS: Cardiac shadow is enlarged. Aortic calcifications are again noted. The lungs are well aerated bilaterally. Previously seen vascular congestion and interstitial edema has improved in the interval. No new focal infiltrate is seen. No bony abnormality is noted. IMPRESSION: Improved CHF when compare with the prior study. Electronically Signed   By: Inez Catalina M.D.   On: 01/07/2021 20:52   ECHOCARDIOGRAM COMPLETE  Result Date: 01/06/2021    ECHOCARDIOGRAM REPORT   Patient Name:   KORDAE BENSMAN Date of Exam: 01/06/2021 Medical Rec #:  [id number]   Height:        71.0 in Accession #:    [id number]  Weight:       395.7 lb Date of Birth:  October 28, 1960  BSA:          2.818 m Patient Age:    40 years    BP:           120/79 mmHg Patient Gender: M           HR:           78 bpm. Exam Location:  Inpatient Procedure: 2D Echo and Intracardiac Opacification Agent Indications:    Congestive Heart Failure  History:        Patient has prior history of Echocardiogram examinations, most                 recent 05/04/2019. CHF; Risk Factors:Hypertension.  Sonographer:    Mikki Santee RDCS (AE) Referring Phys: [id number] [facility]  Sonographer Comments: Suboptimal parasternal window, suboptimal apical window, suboptimal subcostal window, Technically difficult study due to poor echo windows, patient is morbidly obese and Technically challenging study due to limited acoustic windows.  Image acquisition challenging due to patient body habitus. IMPRESSIONS  1. Left ventricular ejection fraction, by estimation, is 55 to 60%. The left ventricle has normal function. Left ventricular endocardial border not optimally defined to evaluate regional wall motion. There is not well visualized left ventricular hypertrophy. Left ventricular diastolic parameters are indeterminate. Very technically difficult study with globally preserved LV function.  2. Right ventricular systolic function was not well visualized. The right ventricular size is not well visualized.  3. The mitral valve was not well visualized. No evidence of mitral valve regurgitation.  4. The aortic valve was not well visualized. Aortic valve regurgitation is not visualized.  5. The inferior vena cava is dilated in size with <50% respiratory variability, suggesting right atrial pressure of 15 mmHg. Comparison(s): A prior study was performed on 05/03/21. Prior images reviewed side by side. Prior study also very limited but LV function appears slightly lower from prior. FINDINGS  Left Ventricle: Left ventricular ejection fraction, by  estimation, is 55 to 60%. The left ventricle has normal function. Left ventricular endocardial border not optimally defined to evaluate regional wall motion. The left ventricular internal cavity size was normal in size. There is not well visualized left ventricular hypertrophy. Left ventricular diastolic parameters are indeterminate. Right Ventricle: The right ventricular size is not well visualized. Right vetricular wall thickness was not assessed. Right ventricular systolic function was not well visualized. Left Atrium: Left atrial size was not well visualized. Right Atrium: Right atrial size was not well visualized. Pericardium: Trivial pericardial effusion is present. Mitral Valve: The mitral valve was not well visualized. No evidence of mitral valve regurgitation. Tricuspid Valve: The tricuspid valve is not well visualized. Tricuspid valve regurgitation is not demonstrated. Aortic Valve: The aortic valve was not well visualized. Aortic valve regurgitation is not visualized. Pulmonic Valve: The pulmonic valve was not well visualized. Pulmonic valve regurgitation is not visualized. Aorta: The ascending aorta was not well visualized and the aortic root was not well visualized. Venous: The inferior vena cava is dilated in size with less than 50% respiratory variability, suggesting right atrial pressure of 15 mmHg. IAS/Shunts: The interatrial septum was not well visualized. Rudean Haskell MD Electronically signed by Rudean Haskell MD Signature Date/Time: 01/06/2021/2:56:28 PM    Final       Subjective: Patient seen and examined bedside, resting comfortably.  Sitting in bedside chair.  No specific complaints this morning.  Ready for discharge home.  Discussed need to comply with low-salt diet, leg elevation and his home diuretic regimen.  Patient requesting portable oxygen device, social work aware and will be evaluated by his home O2 supply company, [employer].  Patient's family denies headache, no  fever/chills/night sweats, no nausea cefonicid diarrhea, no chest pain, no palpitations, no shortness of breath more than his normal baseline, no abdominal pain, no weakness, no fatigue, no paresthesias.  No acute events overnight per nursing staff.  Discharge Exam: Vitals:   01/13/21 0021 01/13/21 0454  BP:  (!) 132/94  Pulse: 76  77  Resp: 16 18  Temp:  97.7 F (36.5 C)  SpO2: 93% 92%   Vitals:   01/12/21 2139 01/13/21 0021 01/13/21 0451 01/13/21 0454  BP: 112/66   (!) 132/94  Pulse: 75 76  77  Resp: '18 16  18  '$ Temp: 98.8 F (37.1 C)   97.7 F (36.5 C)  TempSrc: Oral   Oral  SpO2: 94% 93%  92%  Weight:   (!) 178.7 kg   Height:        General: Pt is alert, awake, not in acute distress, morbidly obese Cardiovascular: RRR, S1/S2 +, no rubs, no gallops Respiratory: CTA bilaterally, no wheezing, no rhonchi, on 2 L nasal cannula with SPO2 92% Abdominal: Soft, NT, ND, bowel sounds + Extremities: Chronic venous changes bilateral lower extremities with some weeping left shin with mild erythema, otherwise no concerning rashes/lesions, 1+ pitting edema bilateral lower extremity, no cyanosis    The results of significant diagnostics from this hospitalization (including imaging, microbiology, ancillary and laboratory) are listed below for reference.     Microbiology: Recent Results (from the past 240 hour(s))  SARS CORONAVIRUS 2 (TAT 6-24 HRS) Nasopharyngeal Nasopharyngeal Swab     Status: None   Collection Time: 01/06/21  1:05 AM   Specimen: Nasopharyngeal Swab  Result Value Ref Range Status   SARS Coronavirus 2 NEGATIVE NEGATIVE Final    Comment: (NOTE) SARS-CoV-2 target nucleic acids are NOT DETECTED.  The SARS-CoV-2 RNA is generally detectable in upper and lower respiratory specimens during the acute phase of infection. Negative results do not preclude SARS-CoV-2 infection, do not rule out co-infections with other pathogens, and should not be used as the sole basis for  treatment or other patient management decisions. Negative results must be combined with clinical observations, patient history, and epidemiological information. The expected result is Negative.  Fact Sheet for Patients: SugarRoll.be  Fact Sheet for Healthcare Providers: https://www.woods-mathews.com/  This test is not yet approved or cleared by the [place] FDA and  has been authorized for detection and/or diagnosis of SARS-CoV-2 by FDA under an Emergency Use Authorization (EUA). This EUA will remain  in effect (meaning this test can be used) for the duration of the COVID-19 declaration under Se ction 564(b)(1) of the Act, 21 U.S.C. section 360bbb-3(b)(1), unless the authorization is terminated or revoked sooner.  Performed at [facility], [place] [address] [place], [place] [address]      Labs: BNP (last 3 results) Recent Labs    01/05/21 1418  BNP 0000000   Basic Metabolic Panel: Recent Labs  Lab 01/07/21 0230 01/08/21 0148 01/09/21 0111 01/10/21 0122 01/11/21 0512 01/11/21 0845 01/13/21 0229  NA 140 140 139 140 139 141  142 137  K 3.3* 4.1 3.3* 4.1 3.9 4.2  3.9 3.9  CL 94* 93* 95* 97* 94*  --  94*  CO2 36* 37* 37* 35* 33*  --  34*  GLUCOSE 98 96 97 104* 95  --  90  BUN 26* 31* 33* 33* 32*  --  35*  CREATININE 1.84* 1.91* 1.81* 1.80* 1.72*  --  1.79*  CALCIUM 9.5 9.5 9.1 9.3 9.5  --  9.4  MG 2.2  --   --  2.5*  --   --   --    Liver Function Tests: No results for input(s): AST, ALT, ALKPHOS, BILITOT, PROT, ALBUMIN in the last 168 hours. No results for input(s): LIPASE, AMYLASE in the last 168 hours. No results for input(s): AMMONIA  in the last 168 hours. CBC: Recent Labs  Lab 01/11/21 0845  HGB 14.3  13.6  HCT 42.0  40.0   Cardiac Enzymes: No results for input(s): CKTOTAL, CKMB, CKMBINDEX, TROPONINI in the last 168 hours. BNP: Invalid input(s): POCBNP CBG: No results for input(s): GLUCAP in the last  168 hours. D-Dimer No results for input(s): DDIMER in the last 72 hours. Hgb A1c No results for input(s): HGBA1C in the last 72 hours. Lipid Profile No results for input(s): CHOL, HDL, LDLCALC, TRIG, CHOLHDL, LDLDIRECT in the last 72 hours. Thyroid function studies No results for input(s): TSH, T4TOTAL, T3FREE, THYROIDAB in the last 72 hours.  Invalid input(s): FREET3 Anemia work up No results for input(s): VITAMINB12, FOLATE, FERRITIN, TIBC, IRON, RETICCTPCT in the last 72 hours. Urinalysis    Component Value Date/Time   COLORURINE YELLOW (A) 05/04/2019 0034   APPEARANCEUR HAZY (A) 05/04/2019 0034   LABSPEC 1.016 05/04/2019 0034   PHURINE 5.0 05/04/2019 0034   GLUCOSEU NEGATIVE 05/04/2019 0034   HGBUR MODERATE (A) 05/04/2019 0034   BILIRUBINUR NEGATIVE 05/04/2019 0034   KETONESUR NEGATIVE 05/04/2019 0034   PROTEINUR NEGATIVE 05/04/2019 0034   NITRITE NEGATIVE 05/04/2019 0034   LEUKOCYTESUR NEGATIVE 05/04/2019 0034   Sepsis Labs Invalid input(s): PROCALCITONIN,  WBC,  LACTICIDVEN Microbiology Recent Results (from the past 240 hour(s))  SARS CORONAVIRUS 2 (TAT 6-24 HRS) Nasopharyngeal Nasopharyngeal Swab     Status: None   Collection Time: 01/06/21  1:05 AM   Specimen: Nasopharyngeal Swab  Result Value Ref Range Status   SARS Coronavirus 2 NEGATIVE NEGATIVE Final    Comment: (NOTE) SARS-CoV-2 target nucleic acids are NOT DETECTED.  The SARS-CoV-2 RNA is generally detectable in upper and lower respiratory specimens during the acute phase of infection. Negative results do not preclude SARS-CoV-2 infection, do not rule out co-infections with other pathogens, and should not be used as the sole basis for treatment or other patient management decisions. Negative results must be combined with clinical observations, patient history, and epidemiological information. The expected result is Negative.  Fact Sheet for Patients: SugarRoll.be  Fact  Sheet for Healthcare Providers: https://www.woods-mathews.com/  This test is not yet approved or cleared by the [place] FDA and  has been authorized for detection and/or diagnosis of SARS-CoV-2 by FDA under an Emergency Use Authorization (EUA). This EUA will remain  in effect (meaning this test can be used) for the duration of the COVID-19 declaration under Se ction 564(b)(1) of the Act, 21 U.S.C. section 360bbb-3(b)(1), unless the authorization is terminated or revoked sooner.  Performed at [facility], [place] [address] [place], [place] [address]      Time coordinating discharge: Over 30 minutes  SIGNED:   Heela Heishman J [place] DO  [facility] 01/13/2021, 10:05 AM

## 2021-01-13 NOTE — Progress Notes (Signed)
Pt is alert and oriented. Discharge instructions/ AVS given to pt. 

## 2021-01-13 NOTE — TOC Initial Note (Signed)
Transition of Care [facility]) - Initial/Assessment Note    Patient Details  Name: Aaron Hale MRN: [id number] Date of Birth: 1961/05/12  Transition of Care [facility]) CM/SW Contact:    Bethena Roys, RN Phone Number: 01/13/2021, 3:50 PM  Clinical Narrative:  Late Entry: Case Manager spoke with patient regarding home health services for unna boot therapy. Initially patient was hesitant regarding home health services. Patient wanted to call his significant other to see if she could change the dressing. Patient has [place] and the only agency willing to accept the patient is [facility] for unna boot therapy. Initially [facility] stated that the patient will need ABIs before they could see him. Patient needs unna boots for fluid. Liaison then called back and stated they would be able to see the patient for home care unna boot care without the ABIs. MD is aware of plan of care and now the patient is agreeable to [facility] RN services. Patient declined [facility] PT services. Oxygen was received from [facility] and an order was written for respiratory to assess the patient in 2 weeks for a portable oxygen concentrator (POC). Case Manager faxed the patient's Medicare card to admissions to be filed.  The patient was transported home via private vehicle.                  Expected Discharge Plan: [place] Barriers to Discharge: No Barriers Identified   Patient Goals and CMS Choice Patient states their goals for this hospitalization and ongoing recovery are:: to return home   Choice offered to / list presented to : Patient (Patient did not have a preference for agency.)  Expected Discharge Plan and Services Expected Discharge Plan: [place] In-house Referral: Clinical Social Work Discharge Planning Services: CM Consult Post Acute Care Choice: [place] arrangements for the past 2 months: Apartment Expected Discharge Date: 01/13/21               DME Arranged:  Oxygen (Respiratory will reass for POC in 2 weeks.) DME Agency: [facility] Date DME Agency Contacted: 01/13/21 Time DME Agency Contacted: 0900 Representative spoke with at DME Agency: Freda Munro HH Arranged: RN,Disease Management [place] Agency: [facility] ([place]) Date [place]: 01/13/21 Time [place]: 1000 Representative spoke with at [place]: [place]  Prior Living Arrangements/Services Living arrangements for the past 2 months: Apartment Lives with:: Self,Significant Other Patient language and need for interpreter reviewed:: Yes Do you feel safe going back to the place where you live?: Yes      Need for Family Participation in Patient Care: Yes (Comment) Care giver support system in place?: Yes (comment)   Criminal Activity/Legal Involvement Pertinent to Current Situation/Hospitalization: No - Comment as needed  Activities of Daily Living Home Assistive Devices/Equipment: Cane (specify quad or straight),BIPAP,Blood pressure cuff,Oxygen,Scales (straight cane) ADL Screening (condition at time of admission) Patient's cognitive ability adequate to safely complete daily activities?: Yes Is the patient deaf or have difficulty hearing?: No Does the patient have difficulty seeing, even when wearing glasses/contacts?: No Does the patient have difficulty concentrating, remembering, or making decisions?: No Patient able to express need for assistance with ADLs?: Yes Does the patient have difficulty dressing or bathing?: No Independently performs ADLs?: Yes (appropriate for developmental age) Does the patient have difficulty walking or climbing stairs?: Yes (d/t back pain and BLE edema; uses a straight cane at home) Weakness of Legs: None Weakness of Arms/Hands: None  Permission Sought/Granted Permission sought to share information with : Family Arboriculturist granted to share information  with : Yes, Verbal Permission Granted     Permission granted to share info w AGENCY: [facility]        Emotional Assessment Appearance:: Appears stated age Attitude/Demeanor/Rapport: Engaged Affect (typically observed): Appropriate Orientation: : Oriented to Self,Oriented to Place,Oriented to  Time,Oriented to Situation Alcohol / Substance Use: Not Applicable Psych Involvement: No (comment)  Admission diagnosis:  Dyspnea on exertion [R06.00] Hypoxia [R09.02] Acute exacerbation of CHF (congestive heart failure) ([place]) [I50.9] Patient Active Problem List   Diagnosis Date Noted  . Obstructive sleep apnea 01/09/2021  . Hyperlipidemia 01/09/2021  . Acute exacerbation of CHF (congestive heart failure) ([place]) 01/05/2021  . Chronic diastolic (congestive) heart failure ([place]) 08/11/2020  . Essential hypertension 08/11/2020  . CRI (chronic renal insufficiency), stage 4 (severe) ([place]) 08/11/2020  . Sleep apnea 08/11/2020  . Morbid obesity ([place]) 08/11/2020  . Shortness of breath 03/30/2020  . Cellulitis 05/03/2019   PCP:  Aretta Nip, MD Pharmacy:   [facility] [place] [id number] [place]  [id number] Phone: [phone] Fax: [phone]     Social Determinants of Health (SDOH) Interventions Food Insecurity Interventions: Intervention Not Indicated Financial Strain Interventions: Intervention Not Indicated Housing Interventions: Intervention Not Indicated Transportation Interventions: Intervention Not Indicated  Readmission Risk Interventions No flowsheet data found.

## 2021-01-13 NOTE — Progress Notes (Signed)
Mobility Specialist: Progress Note   01/13/21 1205  Mobility  Activity Ambulated in hall  Level of Assistance Independent  Assistive Device None  Distance Ambulated (ft) 240 ft  Mobility Ambulated with assistance in hallway  Mobility Response Tolerated well  Mobility performed by Mobility specialist  Bed Position Chair  $Mobility charge 1 Mobility   Pt ambulated on 2 L/min . Pt c/o BLE soreness during ambulation, otherwise asx. Pt to chair after walk and is hopeful for discharge soon.   [facility] Nakeem Murnane Mobility Specialist Mobility Specialist Phone: [phone]

## 2021-01-13 NOTE — Plan of Care (Signed)
  Problem: Health Behavior/Discharge Planning: Goal: Ability to manage health-related needs will improve 01/13/2021 1007 by Camillia Herter, RN Outcome: Progressing 01/13/2021 0714 by Camillia Herter, RN Outcome: Progressing   Problem: Clinical Measurements: Goal: Ability to maintain clinical measurements within normal limits will improve 01/13/2021 1007 by Camillia Herter, RN Outcome: Progressing 01/13/2021 0714 by Camillia Herter, RN Outcome: Progressing Goal: Will remain free from infection 01/13/2021 1007 by Camillia Herter, RN Outcome: Progressing 01/13/2021 0714 by Camillia Herter, RN Outcome: Progressing Goal: Diagnostic test results will improve 01/13/2021 1007 by Camillia Herter, RN Outcome: Progressing 01/13/2021 0714 by Camillia Herter, RN Outcome: Progressing Goal: Respiratory complications will improve 01/13/2021 1007 by Camillia Herter, RN Outcome: Progressing 01/13/2021 0714 by Camillia Herter, RN Outcome: Progressing Goal: Cardiovascular complication will be avoided 01/13/2021 1007 by Camillia Herter, RN Outcome: Progressing 01/13/2021 0714 by Camillia Herter, RN Outcome: Progressing   Problem: Activity: Goal: Risk for activity intolerance will decrease 01/13/2021 1007 by Camillia Herter, RN Outcome: Progressing 01/13/2021 0714 by Camillia Herter, RN Outcome: Progressing   Problem: Nutrition: Goal: Adequate nutrition will be maintained 01/13/2021 1007 by Camillia Herter, RN Outcome: Progressing 01/13/2021 0714 by Camillia Herter, RN Outcome: Progressing   Problem: Coping: Goal: Level of anxiety will decrease 01/13/2021 1007 by Camillia Herter, RN Outcome: Progressing 01/13/2021 0714 by Camillia Herter, RN Outcome: Progressing   Problem: Elimination: Goal: Will not experience complications related to bowel motility 01/13/2021 1007 by Camillia Herter, RN Outcome: Progressing 01/13/2021 0714 by Camillia Herter, RN Outcome: Progressing Goal: Will not experience complications related  to urinary retention 01/13/2021 1007 by Camillia Herter, RN Outcome: Progressing 01/13/2021 0714 by Camillia Herter, RN Outcome: Progressing   Problem: Pain Managment: Goal: General experience of comfort will improve 01/13/2021 1007 by Camillia Herter, RN Outcome: Progressing 01/13/2021 0714 by Camillia Herter, RN Outcome: Progressing   Problem: Safety: Goal: Ability to remain free from injury will improve 01/13/2021 1007 by Camillia Herter, RN Outcome: Progressing 01/13/2021 0714 by Camillia Herter, RN Outcome: Progressing   Problem: Skin Integrity: Goal: Risk for impaired skin integrity will decrease 01/13/2021 1007 by Camillia Herter, RN Outcome: Progressing 01/13/2021 0714 by Camillia Herter, RN Outcome: Progressing   Problem: Education: Goal: Ability to demonstrate management of disease process will improve 01/13/2021 1007 by Camillia Herter, RN Outcome: Progressing 01/13/2021 0714 by Camillia Herter, RN Outcome: Progressing Goal: Ability to verbalize understanding of medication therapies will improve 01/13/2021 1007 by Camillia Herter, RN Outcome: Progressing 01/13/2021 0714 by Camillia Herter, RN Outcome: Progressing Goal: Individualized Educational Video(s) 01/13/2021 1007 by Camillia Herter, RN Outcome: Progressing 01/13/2021 0714 by Camillia Herter, RN Outcome: Progressing   Problem: Activity: Goal: Capacity to carry out activities will improve 01/13/2021 1007 by Camillia Herter, RN Outcome: Progressing 01/13/2021 0714 by Camillia Herter, RN Outcome: Progressing   Problem: Cardiac: Goal: Ability to achieve and maintain adequate cardiopulmonary perfusion will improve 01/13/2021 1007 by Camillia Herter, RN Outcome: Progressing 01/13/2021 0714 by Camillia Herter, RN Outcome: Progressing

## 2021-01-13 NOTE — Care Management (Signed)
01-13-21 1128 Medicare Insurance card faxed to admitting. Bethena Roys, RN,BSN Case Manager

## 2021-01-13 NOTE — Progress Notes (Signed)
Orthopedic Tech Progress Note Patient Details:  Aaron Hale 10-06-1960 [id number]  Ortho Devices Type of Ortho Device: Louretta Parma boot Ortho Device/Splint Location: BLE Ortho Device/Splint Interventions: Ordered,Application   Post Interventions Patient Tolerated: Well Instructions Provided: Care of device   [name] 01/13/2021, 12:37 PM

## 2021-01-19 NOTE — Progress Notes (Signed)
HEART & VASCULAR TRANSITION OF CARE CONSULT NOTE     Referring Physician: Dr Johnsie Cancel  Primary Care: Primary Cardiologist: Dr Landry Dyke Pulmonary: Dr Wonda Amis  Nephrology: Dr Hollie Salk   HPI: Referred to clinic by Dr Johnsie Cancel for heart failure consultation.   Aaron Hale is a 60 y.o. male with a hx of chronic diastolic heart failure, HTN, HLD, CKD stage IV, OSA on BIPAP,and  Obesity.   Admitted [id number] with A/D diastolic heart failure and a/c respiratory failure. Also treated for lower extremity cellulitis.  Diuresed with IV lasix and transitioned to lasix 40 mg twice a day. Of note he is intolerant CPAP.  Discharged weight 394 pounds on 01/13/21.   Presented with his sister. Overall feeling fine. Remains SOB with exertion. Denies PND/Orthopnea. On 2 liters oxygen. Appetite ok. No fever or chills. Weight at home 388  Pounds. Using BIPAP at home.  Taking all medications.   Social:  Lives with his girlfriend. Able drive. Able to get all medications.   Cardiac Testing 01/2021 ECHO EF 55-60% RV normal 2020 EF 60-65% Normal RV  RHC 01/11/21 Findings: RA = 9 RV = 42/13 PA = 38/10 (28) PCW = 13 Fick cardiac output/index = 6.1/2.2 Thermo CO/CI = 8.4/3.0 PVR = 2.5 (Fick), 1.8 (Thermo) Ao sat = 96% PA sat = 64%, 68% Assessment: 1. Mild PAH (likely OSA/OHS) with normal left-sided filling pressures and outputs      Review of Systems: [y] = yes, '[ ]'$  = no   . General: Weight gain '[ ]'$ ; Weight loss '[ ]'$ ; Anorexia '[ ]'$ ; Fatigue [Y ]; Fever '[ ]'$ ; Chills '[ ]'$ ; Weakness [Y ]  . Cardiac: Chest pain/pressure '[ ]'$ ; Resting SOB '[ ]'$ ; Exertional SOB [Y ]; Orthopnea '[ ]'$ ; Pedal Edema [ Y]; Palpitations '[ ]'$ ; Syncope '[ ]'$ ; Presyncope '[ ]'$ ; Paroxysmal nocturnal dyspnea'[ ]'$   . Pulmonary: Cough '[ ]'$ ; Wheezing'[ ]'$ ; Hemoptysis'[ ]'$ ; Sputum '[ ]'$ ; Snoring '[ ]'$   . GI: Vomiting'[ ]'$ ; Dysphagia'[ ]'$ ; Melena'[ ]'$ ; Hematochezia '[ ]'$ ; Heartburn'[ ]'$ ; Abdominal pain '[ ]'$ ; Constipation '[ ]'$ ; Diarrhea '[ ]'$ ; BRBPR '[ ]'$   . GU: Hematuria'[ ]'$ ; Dysuria  '[ ]'$ ; Nocturia'[ ]'$   . Vascular: Pain in legs with walking [ Y]; Pain in feet with lying flat '[ ]'$ ; Non-healing sores '[ ]'$ ; Stroke '[ ]'$ ; TIA '[ ]'$ ; Slurred speech '[ ]'$ ;  . Neuro: Headaches'[ ]'$ ; Vertigo'[ ]'$ ; Seizures'[ ]'$ ; Paresthesias'[ ]'$ ;Blurred vision '[ ]'$ ; Diplopia '[ ]'$ ; Vision changes '[ ]'$   . Ortho/Skin: Arthritis '[ ]'$ ; Joint pain [Y ]; Muscle pain '[ ]'$ ; Joint swelling '[ ]'$ ; Back Pain [ Y]; Rash '[ ]'$   . Psych: Depression'[ ]'$ ; Anxiety'[ ]'$   . Heme: Bleeding problems '[ ]'$ ; Clotting disorders '[ ]'$ ; Anemia '[ ]'$   . Endocrine: Diabetes '[ ]'$ ; Thyroid dysfunction'[ ]'$    Past Medical History:  Diagnosis Date  . CHF (congestive heart failure) ([place])   . Chronic kidney disease   . Hypertension   . Morbid obesity ([place])   . Shortness of breath 03/30/2020  . Sleep apnea    Bipap    Current Outpatient Medications  Medication Sig Dispense Refill  . aspirin 81 MG chewable tablet Chew 81 mg by mouth daily.    . carvedilol (COREG) 12.5 MG tablet Take 1 tablet (12.5 mg total) by mouth 2 (two) times daily with a meal. 180 tablet 3  . ferrous sulfate 325 (65 FE) MG tablet Take 325 mg by mouth 2 (two) times daily with a meal.    .  furosemide (LASIX) 40 MG tablet Take 1 tablet (40 mg total) by mouth 2 (two) times daily. 180 tablet 0  . OXYGEN Inhale 2 L/min into the lungs as needed (for shortness of breath).    . rosuvastatin (CRESTOR) 10 MG tablet Take 1 tablet by mouth once daily 90 tablet 0   No current facility-administered medications for this encounter.    Allergies  Allergen Reactions  . Spironolactone Other (See Comments)    Reaction not recalled      Social History   Socioeconomic History  . Marital status: Single    Spouse name: Not on file  . Number of children: Not on file  . Years of education: Not on file  . Highest education level: Not on file  Occupational History  . Occupation: retired  Tobacco Use  . Smoking status: Never Smoker  . Smokeless tobacco: Never Used  Vaping Use  . Vaping Use: Never  used  Substance and Sexual Activity  . Alcohol use: Not Currently  . Drug use: No  . Sexual activity: Not Currently  Other Topics Concern  . Not on file  Social History Narrative  . Not on file   Social Determinants of Health   Financial Resource Strain: Low Risk   . Difficulty of Paying Living Expenses: Not very hard  Food Insecurity: No Food Insecurity  . Worried About Charity fundraiser in the Last Year: Never true  . Ran Out of Food in the Last Year: Never true  Transportation Needs: No Transportation Needs  . Lack of Transportation (Medical): No  . Lack of Transportation (Non-Medical): No  Physical Activity: Not on file  Stress: Not on file  Social Connections: Not on file  Intimate Partner Violence: Not on file      Family History  Problem Relation Age of Onset  . CAD Mother     Vitals:   01/20/21 1519  BP: 114/60  Pulse: 85  SpO2: 94%  Weight: (!) 176.5 kg  Height: '5\' 11"'$  (1.803 m)   Wt Readings from Last 3 Encounters:  01/20/21 (!) 176.5 kg  01/13/21 (!) 178.7 kg  11/08/20 (!) 174.2 kg     PHYSICAL EXAM: General:  Arrived in the wheel chair. No respiratory difficulty HEENT: normal Neck: supple. JVP difficult to assess. Carotids 2+ bilat; no bruits. No lymphadenopathy or thryomegaly appreciated. Cor: PMI nondisplaced. Regular rate & rhythm. No rubs, gallops or murmurs. Lungs: clear on 2 liters [place]  Abdomen: obese, soft, nontender, nondistended. No hepatosplenomegaly. No bruits or masses. Good bowel sounds. Extremities: no cyanosis, clubbing, rash, edema Neuro: alert & oriented x 3, cranial nerves grossly intact. moves all 4 extremities w/o difficulty. Affect pleasant.     ASSESSMENT & PLAN: 1. Chronic Diastolic Heart Failure -ECHO 2022 - EF 55-60% RV normal  -NYHA III. Volume status stable.  -Continue lasix 40 mg twice a day. Asked to take an extra 40 mg po lasix as needed for 3  Pounds weight gain in 24 hours. Discussed low salt food choices and  to limit fluid intake to < 2 liters per day. -Continue carvedilol 12.5 mg twice a day  - Check BMET   2.OSA -Continue Bipap daily.  -Set up follow up with Pulmonary.   3.Obesity  Body mass index is 54.28 kg/m.  -Discussed portion control.   4. CKD Stage IIIb  -Creatinine baseline 1.8  - Check BMET - Established with Dr Hollie Salk.   5. Chronic Hypoxemic Respiratory Failure On 2 liters oxygen.  Referred to HFSW- NO  Refer to Pharmacy:  No Refer to Home Health: No Refer to Advanced Heart Failure Clinic: NO   Refer to General Cardiology: He has follow up with general cardiology.   Follow up with HF as needed.    Abdulahad Mederos Np_C  5:01 PM

## 2021-01-20 ENCOUNTER — Telehealth (HOSPITAL_COMMUNITY): Payer: Self-pay

## 2021-01-20 ENCOUNTER — Encounter (HOSPITAL_COMMUNITY): Payer: Self-pay

## 2021-01-20 ENCOUNTER — Ambulatory Visit (HOSPITAL_COMMUNITY)
Admit: 2021-01-20 | Discharge: 2021-01-20 | Disposition: A | Discharge status: Home / Self Care | Payer: Medicare Other | Attending: Internal Medicine | Admitting: Internal Medicine

## 2021-01-20 ENCOUNTER — Other Ambulatory Visit: Payer: Self-pay

## 2021-01-20 VITALS — BP 114/60 | HR 85 | Ht 71.0 in | Wt 389.2 lb

## 2021-01-20 DIAGNOSIS — N184 Chronic kidney disease, stage 4 (severe): Secondary | ICD-10-CM | POA: Diagnosis not present

## 2021-01-20 DIAGNOSIS — Z6841 Body Mass Index (BMI) 40.0 and over, adult: Secondary | ICD-10-CM | POA: Insufficient documentation

## 2021-01-20 DIAGNOSIS — E669 Obesity, unspecified: Secondary | ICD-10-CM | POA: Diagnosis not present

## 2021-01-20 DIAGNOSIS — Z7982 Long term (current) use of aspirin: Secondary | ICD-10-CM | POA: Diagnosis not present

## 2021-01-20 DIAGNOSIS — J9611 Chronic respiratory failure with hypoxia: Secondary | ICD-10-CM | POA: Diagnosis not present

## 2021-01-20 DIAGNOSIS — N183 Chronic kidney disease, stage 3 unspecified: Secondary | ICD-10-CM | POA: Diagnosis not present

## 2021-01-20 DIAGNOSIS — Z79899 Other long term (current) drug therapy: Secondary | ICD-10-CM | POA: Diagnosis not present

## 2021-01-20 DIAGNOSIS — I5032 Chronic diastolic (congestive) heart failure: Secondary | ICD-10-CM | POA: Diagnosis present

## 2021-01-20 DIAGNOSIS — I130 Hypertensive heart and chronic kidney disease with heart failure and stage 1 through stage 4 chronic kidney disease, or unspecified chronic kidney disease: Secondary | ICD-10-CM | POA: Insufficient documentation

## 2021-01-20 DIAGNOSIS — G4733 Obstructive sleep apnea (adult) (pediatric): Secondary | ICD-10-CM | POA: Diagnosis not present

## 2021-01-20 DIAGNOSIS — Z8249 Family history of ischemic heart disease and other diseases of the circulatory system: Secondary | ICD-10-CM | POA: Insufficient documentation

## 2021-01-20 DIAGNOSIS — R0689 Other abnormalities of breathing: Secondary | ICD-10-CM | POA: Diagnosis not present

## 2021-01-20 DIAGNOSIS — E785 Hyperlipidemia, unspecified: Secondary | ICD-10-CM | POA: Diagnosis not present

## 2021-01-20 LAB — BASIC METABOLIC PANEL
Anion gap: 8 (ref 5–15)
BUN: 23 mg/dL — ABNORMAL HIGH (ref 6–20)
CO2: 32 mmol/L (ref 22–32)
Calcium: 9.3 mg/dL (ref 8.9–10.3)
Chloride: 98 mmol/L (ref 98–111)
Creatinine, Ser: 1.57 mg/dL — ABNORMAL HIGH (ref 0.61–1.24)
GFR, Estimated: 50 mL/min — ABNORMAL LOW (ref >60)
Glucose, Bld: 90 mg/dL (ref 70–99)
Potassium: 4.1 mmol/L (ref 3.5–5.1)
Sodium: 138 mmol/L (ref 135–145)

## 2021-01-20 LAB — BRAIN NATRIURETIC PEPTIDE: B Natriuretic Peptide: 11.9 pg/mL (ref 0.0–100.0)

## 2021-01-20 NOTE — Telephone Encounter (Signed)
Heart Failure Nurse Navigator Progress Note  Called to confirm Heart & Vascular Transitions of Care appointment at 3pm. Patient reminded to bring all medications and pill box organizer with them. Confirmed patient has transportation. Gave directions, instructed to utilize valet parking.  Confirmed appointment prior to ending call.   Anjel Pardo, RN, BSN Heart Failure Nurse Navigator [phone]  

## 2021-01-20 NOTE — Patient Instructions (Addendum)
Be sure to follow up with Dr Tammi Klippel for a follow up [facility] Address:  [address] [place], [place] [address] Phone: [phone]  Labs today We will only contact you if something comes back abnormal or we need to make some changes. Otherwise no news is good news!  Please call us follow up when needed

## 2021-02-08 DIAGNOSIS — N184 Chronic kidney disease, stage 4 (severe): Secondary | ICD-10-CM | POA: Diagnosis not present

## 2021-02-08 DIAGNOSIS — J9621 Acute and chronic respiratory failure with hypoxia: Secondary | ICD-10-CM | POA: Diagnosis not present

## 2021-02-08 DIAGNOSIS — Z9181 History of falling: Secondary | ICD-10-CM | POA: Diagnosis not present

## 2021-02-08 DIAGNOSIS — I130 Hypertensive heart and chronic kidney disease with heart failure and stage 1 through stage 4 chronic kidney disease, or unspecified chronic kidney disease: Secondary | ICD-10-CM | POA: Diagnosis not present

## 2021-02-08 DIAGNOSIS — I872 Venous insufficiency (chronic) (peripheral): Secondary | ICD-10-CM | POA: Diagnosis not present

## 2021-02-08 DIAGNOSIS — I5033 Acute on chronic diastolic (congestive) heart failure: Secondary | ICD-10-CM | POA: Diagnosis not present

## 2021-02-08 DIAGNOSIS — E785 Hyperlipidemia, unspecified: Secondary | ICD-10-CM | POA: Diagnosis not present

## 2021-02-08 DIAGNOSIS — Z7982 Long term (current) use of aspirin: Secondary | ICD-10-CM | POA: Diagnosis not present

## 2021-02-08 DIAGNOSIS — G4733 Obstructive sleep apnea (adult) (pediatric): Secondary | ICD-10-CM | POA: Diagnosis not present

## 2021-02-08 DIAGNOSIS — N179 Acute kidney failure, unspecified: Secondary | ICD-10-CM | POA: Diagnosis not present

## 2021-02-08 DIAGNOSIS — Z9981 Dependence on supplemental oxygen: Secondary | ICD-10-CM | POA: Diagnosis not present

## 2021-02-09 DIAGNOSIS — J9611 Chronic respiratory failure with hypoxia: Secondary | ICD-10-CM | POA: Diagnosis not present

## 2021-02-09 DIAGNOSIS — I509 Heart failure, unspecified: Secondary | ICD-10-CM | POA: Diagnosis not present

## 2021-02-11 DIAGNOSIS — Z9981 Dependence on supplemental oxygen: Secondary | ICD-10-CM | POA: Diagnosis not present

## 2021-02-11 DIAGNOSIS — J9621 Acute and chronic respiratory failure with hypoxia: Secondary | ICD-10-CM | POA: Diagnosis not present

## 2021-02-11 DIAGNOSIS — G4733 Obstructive sleep apnea (adult) (pediatric): Secondary | ICD-10-CM | POA: Diagnosis not present

## 2021-02-11 DIAGNOSIS — I130 Hypertensive heart and chronic kidney disease with heart failure and stage 1 through stage 4 chronic kidney disease, or unspecified chronic kidney disease: Secondary | ICD-10-CM | POA: Diagnosis not present

## 2021-02-11 DIAGNOSIS — Z7982 Long term (current) use of aspirin: Secondary | ICD-10-CM | POA: Diagnosis not present

## 2021-02-11 DIAGNOSIS — Z9181 History of falling: Secondary | ICD-10-CM | POA: Diagnosis not present

## 2021-02-11 DIAGNOSIS — N184 Chronic kidney disease, stage 4 (severe): Secondary | ICD-10-CM | POA: Diagnosis not present

## 2021-02-11 DIAGNOSIS — I5033 Acute on chronic diastolic (congestive) heart failure: Secondary | ICD-10-CM | POA: Diagnosis not present

## 2021-02-11 DIAGNOSIS — N179 Acute kidney failure, unspecified: Secondary | ICD-10-CM | POA: Diagnosis not present

## 2021-02-11 DIAGNOSIS — E785 Hyperlipidemia, unspecified: Secondary | ICD-10-CM | POA: Diagnosis not present

## 2021-02-11 DIAGNOSIS — I872 Venous insufficiency (chronic) (peripheral): Secondary | ICD-10-CM | POA: Diagnosis not present

## 2021-02-15 ENCOUNTER — Ambulatory Visit: Payer: 59 | Admitting: Primary Care

## 2021-02-15 DIAGNOSIS — N179 Acute kidney failure, unspecified: Secondary | ICD-10-CM | POA: Diagnosis not present

## 2021-02-15 DIAGNOSIS — Z7982 Long term (current) use of aspirin: Secondary | ICD-10-CM | POA: Diagnosis not present

## 2021-02-15 DIAGNOSIS — N184 Chronic kidney disease, stage 4 (severe): Secondary | ICD-10-CM | POA: Diagnosis not present

## 2021-02-15 DIAGNOSIS — G4733 Obstructive sleep apnea (adult) (pediatric): Secondary | ICD-10-CM | POA: Diagnosis not present

## 2021-02-15 DIAGNOSIS — Z9181 History of falling: Secondary | ICD-10-CM | POA: Diagnosis not present

## 2021-02-15 DIAGNOSIS — E785 Hyperlipidemia, unspecified: Secondary | ICD-10-CM | POA: Diagnosis not present

## 2021-02-15 DIAGNOSIS — Z9981 Dependence on supplemental oxygen: Secondary | ICD-10-CM | POA: Diagnosis not present

## 2021-02-15 DIAGNOSIS — J9621 Acute and chronic respiratory failure with hypoxia: Secondary | ICD-10-CM | POA: Diagnosis not present

## 2021-02-15 DIAGNOSIS — I872 Venous insufficiency (chronic) (peripheral): Secondary | ICD-10-CM | POA: Diagnosis not present

## 2021-02-15 DIAGNOSIS — I5033 Acute on chronic diastolic (congestive) heart failure: Secondary | ICD-10-CM | POA: Diagnosis not present

## 2021-02-15 DIAGNOSIS — I130 Hypertensive heart and chronic kidney disease with heart failure and stage 1 through stage 4 chronic kidney disease, or unspecified chronic kidney disease: Secondary | ICD-10-CM | POA: Diagnosis not present

## 2021-02-16 DIAGNOSIS — N184 Chronic kidney disease, stage 4 (severe): Secondary | ICD-10-CM | POA: Diagnosis not present

## 2021-02-16 DIAGNOSIS — G4733 Obstructive sleep apnea (adult) (pediatric): Secondary | ICD-10-CM | POA: Diagnosis not present

## 2021-02-16 DIAGNOSIS — I872 Venous insufficiency (chronic) (peripheral): Secondary | ICD-10-CM | POA: Diagnosis not present

## 2021-02-16 DIAGNOSIS — Z9181 History of falling: Secondary | ICD-10-CM | POA: Diagnosis not present

## 2021-02-16 DIAGNOSIS — E785 Hyperlipidemia, unspecified: Secondary | ICD-10-CM | POA: Diagnosis not present

## 2021-02-16 DIAGNOSIS — N179 Acute kidney failure, unspecified: Secondary | ICD-10-CM | POA: Diagnosis not present

## 2021-02-16 DIAGNOSIS — Z7982 Long term (current) use of aspirin: Secondary | ICD-10-CM | POA: Diagnosis not present

## 2021-02-16 DIAGNOSIS — J9621 Acute and chronic respiratory failure with hypoxia: Secondary | ICD-10-CM | POA: Diagnosis not present

## 2021-02-16 DIAGNOSIS — Z9981 Dependence on supplemental oxygen: Secondary | ICD-10-CM | POA: Diagnosis not present

## 2021-02-16 DIAGNOSIS — I5033 Acute on chronic diastolic (congestive) heart failure: Secondary | ICD-10-CM | POA: Diagnosis not present

## 2021-02-16 DIAGNOSIS — I130 Hypertensive heart and chronic kidney disease with heart failure and stage 1 through stage 4 chronic kidney disease, or unspecified chronic kidney disease: Secondary | ICD-10-CM | POA: Diagnosis not present

## 2021-02-20 ENCOUNTER — Other Ambulatory Visit: Payer: Self-pay | Admitting: Cardiology

## 2021-02-21 NOTE — Progress Notes (Signed)
Cardiology Clinic Note   Patient Name: Aaron Hale Date of Encounter: 02/22/2021  Primary Care Provider:  Aretta Nip, MD Primary Cardiologist:  Donato Heinz, MD  Patient Profile    Aaron Hale 60 year old male presents the clinic today for follow-up evaluation of his chronic diastolic CHF and hypertension.  Past Medical History    Past Medical History:  Diagnosis Date   CHF (congestive heart failure) ([place])    Chronic kidney disease    Hypertension    Morbid obesity ([place])    Shortness of breath 03/30/2020   Sleep apnea    Bipap   Past Surgical History:  Procedure Laterality Date   RIGHT HEART CATH N/A 01/11/2021   Procedure: RIGHT HEART CATH;  Surgeon: Jolaine Artist, MD;  Location: [facility];  Service: Cardiovascular;  Laterality: N/A;    Allergies  Allergies  Allergen Reactions   Spironolactone Other (See Comments)    Reaction not recalled    History of Present Illness    Aaron Hale has a PMH of chronic diastolic CHF, essential hypertension, OSA on BiPAP, chronic renal insufficiency stage IV, morbid obesity, and hyperlipidemia.  He was admitted to the hospital on [id number] acute diastolic CHF and acute respiratory failure.  He also received treatment for lower extremity cellulitis.  He received IV diuresis and was transitioned to furosemide 40 mg p.o. twice daily.  He is CPAP intolerant.  His discharge weight was 394 pounds on 01/13/2021.  He followed up with the transitional care clinic on 01/20/2021.  He presented with his sister.  He reported feeling well.  He continued with shortness of breath on exertion.  He denied orthopnea and PND.  He was noted to be on 2 L of oxygen.  He reported that his appetite was okay.  He denied fever and chills.  His home weight was 388 pounds.  He reported compliance with his BiPAP and medications.  His echocardiogram 5/22 showed an EF of 55-60% with normal RV function.  His echocardiogram 2020 showed an  EF of 60-65% with normal right ventricular function.  He presents the clinic today for follow-up evaluation states that he is doing well.  He is working with physical therapy once per week and completing the physical therapy exercises the other days of the week when physical therapy has not there.  He continues to watch his diet and avoid salt as much as he can.  His weight remains stable.  He continues to use 2 L of oxygen.  He was sent the wrong components with his BiPAP device.  It did not allow for supplemental oxygen to be attached.  He will start using his BiPAP device in the near future.  We will have him maintain his physical activity, maintain his low-salt heart healthy diet, continue daily weights, and follow-up with Dr. Nechama Guard in 3 months.  Today he denies chest pain, shortness of breath, lower extremity edema, fatigue, palpitations, melena, hematuria, hemoptysis, diaphoresis, weakness, presyncope, syncope, orthopnea, and PND.     Home Medications    Prior to Admission medications   Medication Sig Start Date End Date Taking? Authorizing Provider  aspirin 81 MG chewable tablet Chew 81 mg by mouth daily.    [provider]  carvedilol (COREG) 12.5 MG tablet Take 1 tablet (12.5 mg total) by mouth 2 (two) times daily with a meal. 08/11/20   [place], Doreene Burke, PA-C  ferrous sulfate 325 (65 FE) MG tablet Take 325 mg by mouth 2 (two)  times daily with a meal.    [provider]  furosemide (LASIX) 40 MG tablet Take 1 tablet (40 mg total) by mouth 2 (two) times daily. 01/13/21 04/13/21  [place] Donnamarie Poag, DO  OXYGEN Inhale 2 L/min into the lungs as needed (for shortness of breath).    [provider]  rosuvastatin (CRESTOR) 10 MG tablet Take 1 tablet by mouth once daily 11/09/20   Donato Heinz, MD    Family History    Family History  Problem Relation Age of Onset   CAD Mother    He indicated that the status of his mother is unknown.  Social History     Social History   Socioeconomic History   Marital status: Single    Spouse name: Not on file   Number of children: Not on file   Years of education: Not on file   Highest education level: Not on file  Occupational History   Occupation: retired  Tobacco Use   Smoking status: Never   Smokeless tobacco: Never  Vaping Use   Vaping Use: Never used  Substance and Sexual Activity   Alcohol use: Not Currently   Drug use: No   Sexual activity: Not Currently  Other Topics Concern   Not on file  Social History Narrative   Not on file   Social Determinants of Health   Financial Resource Strain: Low Risk    Difficulty of Paying Living Expenses: Not very hard  Food Insecurity: No Food Insecurity   Worried About Charity fundraiser in the Last Year: Never true   Ran Out of Food in the Last Year: Never true  Transportation Needs: No Transportation Needs   Lack of Transportation (Medical): No   Lack of Transportation (Non-Medical): No  Physical Activity: Not on file  Stress: Not on file  Social Connections: Not on file  Intimate Partner Violence: Not on file     Review of Systems    General:  No chills, fever, night sweats or weight changes.  Cardiovascular:  No chest pain, dyspnea on exertion, edema, orthopnea, palpitations, paroxysmal nocturnal dyspnea. Dermatological: No rash, lesions/masses Respiratory: No cough, dyspnea Urologic: No hematuria, dysuria Abdominal:   No nausea, vomiting, diarrhea, bright red blood per rectum, melena, or hematemesis Neurologic:  No visual changes, wkns, changes in mental status. All other systems reviewed and are otherwise negative except as noted above.  Physical Exam    VS:  BP 130/78 (BP Location: Left Arm)   Pulse 84   Ht '5\' 11"'$  (1.803 m)   Wt (!) 389 lb (176.4 kg)   SpO2 94%   BMI 54.25 kg/m  , BMI Body mass index is 54.25 kg/m. GEN: Well nourished, well developed, in no acute distress. HEENT: normal. Neck: Supple, no JVD, carotid  bruits, or masses. Cardiac: RRR, no murmurs, rubs, or gallops. No clubbing, cyanosis, edema.  Radials/DP/PT 2+ and equal bilaterally.  Respiratory:  Respirations regular and unlabored, clear to auscultation bilaterally. GI: Soft, nontender, nondistended, BS + x 4. MS: no deformity or atrophy. Skin: warm and dry, no rash. Neuro:  Strength and sensation are intact. Psych: Normal affect.  Accessory Clinical Findings    Recent Labs: 01/05/2021: ALT 16 01/06/2021: Platelets 175 01/10/2021: Magnesium 2.5 01/11/2021: Hemoglobin 13.6; Hemoglobin 14.3 01/20/2021: B Natriuretic Peptide 11.9; BUN 23; Creatinine, Ser 1.57; Potassium 4.1; Sodium 138   Recent Lipid Panel    Component Value Date/Time   CHOL 130 01/08/2021 0148   TRIG 111 01/08/2021 0148  HDL 38 (L) 01/08/2021 0148   CHOLHDL 3.4 01/08/2021 0148   VLDL 22 01/08/2021 0148   LDLCALC 70 01/08/2021 0148    ECG personally reviewed by me today- none today.   Echocardiogram 01/06/2021 IMPRESSIONS     1. Left ventricular ejection fraction, by estimation, is 55 to 60%. The  left ventricle has normal function. Left ventricular endocardial border  not optimally defined to evaluate regional wall motion. There is not well  visualized left ventricular  hypertrophy. Left ventricular diastolic parameters are indeterminate. Very  technically difficult study with globally preserved LV function.   2. Right ventricular systolic function was not well visualized. The right  ventricular size is not well visualized.   3. The mitral valve was not well visualized. No evidence of mitral valve  regurgitation.   4. The aortic valve was not well visualized. Aortic valve regurgitation  is not visualized.   5. The inferior vena cava is dilated in size with <50% respiratory  variability, suggesting right atrial pressure of 15 mmHg.   Comparison(s): A prior study was performed on 05/03/21. Prior images  reviewed side by side. Prior study also very limited but  LV function  appears slightly lower from prior.    Assessment & Plan   1.  Chronic diastolic CHF-no increased DOE or activity intolerance.  Continues on 2 L nasal cannula.  Weight today 389.  Echocardiogram 5/22 showed LVEF of 55-60% and intermediate diastolic parameters.  Continue furosemide, carvedilol Heart healthy low-sodium diet-salty 6 given Increase physical activity as tolerated  Essential hypertension-BP today 130/78.  Well-controlled.120-130/80's Continue carvedilol Heart healthy low-sodium diet-salty 6 given Increase physical activity as tolerated  Hyperlipidemia-01/08/2021: Cholesterol 130; HDL 38; LDL Cholesterol 70; Triglycerides 111; VLDL 22 Continue aspirin, rosuvastatin Heart healthy low-sodium high-fiber diet.   Increase physical activity as tolerated  OSA-sent wrong components with bipap initially. Planning to try in near future.  Continue BiPAP use Follows with pulmonology  Obesity-weight today 389 lbs Heart healthy low-sodium diet-salty 6 given Increase physical activity as tolerated  CKD stage IIIb-creatinine 1.57.  Baseline around 1.8 Follows with nephrology   Disposition: Follow-up with Dr. Gardiner Rhyme in 3 months.   Jossie Ng. Josanna Hefel NP-C    02/22/2021, 2:26 PM [place] [address] Office [phone] Fax [phone]  Notice: This dictation was prepared with Dragon dictation along with smaller phrase technology. Any transcriptional errors that result from this process are unintentional and may not be corrected upon review.  I spent 12 minutes examining this patient, reviewing medications, and using patient centered shared decision making involving her cardiac care.  Prior to her visit I spent greater than 20 minutes reviewing her past medical history,  medications, and prior cardiac tests.

## 2021-02-22 ENCOUNTER — Other Ambulatory Visit: Payer: Self-pay

## 2021-02-22 ENCOUNTER — Ambulatory Visit: Payer: Medicare Other | Admitting: General Practice

## 2021-02-22 ENCOUNTER — Encounter: Payer: Self-pay | Admitting: General Practice

## 2021-02-22 VITALS — BP 130/78 | HR 84 | Ht 71.0 in | Wt 389.0 lb

## 2021-02-22 DIAGNOSIS — I5032 Chronic diastolic (congestive) heart failure: Secondary | ICD-10-CM | POA: Diagnosis not present

## 2021-02-22 DIAGNOSIS — E785 Hyperlipidemia, unspecified: Secondary | ICD-10-CM

## 2021-02-22 DIAGNOSIS — I10 Essential (primary) hypertension: Secondary | ICD-10-CM | POA: Diagnosis not present

## 2021-02-22 DIAGNOSIS — G4733 Obstructive sleep apnea (adult) (pediatric): Secondary | ICD-10-CM | POA: Diagnosis not present

## 2021-02-22 DIAGNOSIS — N183 Chronic kidney disease, stage 3 unspecified: Secondary | ICD-10-CM

## 2021-02-22 NOTE — Patient Instructions (Signed)
Medication Instructions:  The current medical regimen is effective;  continue present plan and medications as directed. Please refer to the Current Medication list given to you today. *If you need a refill on your cardiac medications before your next appointment, please call your pharmacy*  Lab Work:   Testing/Procedures:  NONE    NONE  Special Instructions TAKE AND LOG YOUR WEIGHT DAILY  PLEASE READ AND FOLLOW SALTY 6-ATTACHED-1,'800mg'$  daily  PLEASE MAINTAIN PHYSICAL ACTIVITY AS TOLERATED  START USING YOUR BIPAP MACHINE  Follow-Up: Your next appointment:  3 month(s) In Person with Oswaldo Milian, MD OR IF UNAVAILABLE [place], FNP-C   At [facility], you and your health needs are our priority.  As part of our continuing mission to provide you with exceptional heart care, we have created designated Provider Care Teams.  These Care Teams include your primary Cardiologist (physician) and Advanced Practice Providers (APPs -  Physician Assistants and Nurse Practitioners) who all work together to provide you with the care you need, when you need it.            6 SALTY THINGS TO AVOID     1,'800MG'$  DAILY

## 2021-02-24 DIAGNOSIS — G4733 Obstructive sleep apnea (adult) (pediatric): Secondary | ICD-10-CM | POA: Diagnosis not present

## 2021-02-24 DIAGNOSIS — Z9981 Dependence on supplemental oxygen: Secondary | ICD-10-CM | POA: Diagnosis not present

## 2021-02-24 DIAGNOSIS — N2581 Secondary hyperparathyroidism of renal origin: Secondary | ICD-10-CM | POA: Diagnosis not present

## 2021-02-24 DIAGNOSIS — I129 Hypertensive chronic kidney disease with stage 1 through stage 4 chronic kidney disease, or unspecified chronic kidney disease: Secondary | ICD-10-CM | POA: Diagnosis not present

## 2021-02-24 DIAGNOSIS — N184 Chronic kidney disease, stage 4 (severe): Secondary | ICD-10-CM | POA: Diagnosis not present

## 2021-02-24 DIAGNOSIS — G4730 Sleep apnea, unspecified: Secondary | ICD-10-CM | POA: Diagnosis not present

## 2021-02-24 DIAGNOSIS — I5030 Unspecified diastolic (congestive) heart failure: Secondary | ICD-10-CM | POA: Diagnosis not present

## 2021-02-24 DIAGNOSIS — D649 Anemia, unspecified: Secondary | ICD-10-CM | POA: Diagnosis not present

## 2021-02-25 DIAGNOSIS — I130 Hypertensive heart and chronic kidney disease with heart failure and stage 1 through stage 4 chronic kidney disease, or unspecified chronic kidney disease: Secondary | ICD-10-CM | POA: Diagnosis not present

## 2021-02-25 DIAGNOSIS — J9621 Acute and chronic respiratory failure with hypoxia: Secondary | ICD-10-CM | POA: Diagnosis not present

## 2021-02-25 DIAGNOSIS — Z9981 Dependence on supplemental oxygen: Secondary | ICD-10-CM | POA: Diagnosis not present

## 2021-02-25 DIAGNOSIS — E785 Hyperlipidemia, unspecified: Secondary | ICD-10-CM | POA: Diagnosis not present

## 2021-02-25 DIAGNOSIS — Z7982 Long term (current) use of aspirin: Secondary | ICD-10-CM | POA: Diagnosis not present

## 2021-02-25 DIAGNOSIS — I872 Venous insufficiency (chronic) (peripheral): Secondary | ICD-10-CM | POA: Diagnosis not present

## 2021-02-25 DIAGNOSIS — Z9181 History of falling: Secondary | ICD-10-CM | POA: Diagnosis not present

## 2021-02-25 DIAGNOSIS — G4733 Obstructive sleep apnea (adult) (pediatric): Secondary | ICD-10-CM | POA: Diagnosis not present

## 2021-02-25 DIAGNOSIS — N184 Chronic kidney disease, stage 4 (severe): Secondary | ICD-10-CM | POA: Diagnosis not present

## 2021-02-25 DIAGNOSIS — N179 Acute kidney failure, unspecified: Secondary | ICD-10-CM | POA: Diagnosis not present

## 2021-02-25 DIAGNOSIS — I5033 Acute on chronic diastolic (congestive) heart failure: Secondary | ICD-10-CM | POA: Diagnosis not present

## 2021-03-01 ENCOUNTER — Ambulatory Visit: Payer: Medicare Other | Admitting: Primary Care

## 2021-03-01 NOTE — Progress Notes (Deleted)
$'@Patient'V$  ID: Aaron Hale, male    DOB: 01/11/1961, 60 y.o.   MRN: [id number]  No chief complaint on file.   Referring provider: Aretta Nip, MD  HPI: 60 year old male, never smoked.  Past medical history significant for obesity, OSA, CHF, hypertension, chronic kidney disease, dyspnea, acute on chronic hypoxia.  Patient of Dr. Vaughan Browner last seen in office on March 31, 2020.  Previous [facility] pulmonary encounter: 60 year old with history of obesity, CHF, hypertension, CKD Evaluation for dyspnea, hypoxia. He had a hospitalization in [id number] for diastolic heart failure treated with diuresis.  He will start on supplemental oxygen at that time and continues on 2 L oxygen prescribed adequate diuresis and management of heart failure.  Hence he was sent to [facility] for [facility] evaluation   Complains of mild dyspnea on exertion.  No cough, sputum production, fevers, chills   Pets: Has birds at home.  He had a cockatiel for many years.  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  No mold, hot tub, Jacuzzi.  No down pillows or comforter Smoking history: Never smoker Travel history: Originally from [place].  Moved to [place] in 2013 Relevant family history: No significant family history of lung disease.   03/31/20- Dr. Vaughan Browner  States that dyspnea is doing well.  No issues Had a split-night sleep study yesterday and results are awaited   Had an episode of dizziness while doing PFTs and test was stopped early He is now recovered and back to baseline.  Thinks that he got claustrophobic while in the plethysmography box  03/01/2021- Interim Patient presents today for an overdue follow-up.  He was hospitalized from 01/05/21-01/13/21 for acute hypoxia respiratory failure. Presented from nephrologist office and found to be hypoxic on room air with associated weight gain, lower extremity edema and  shortness of breath. CXR with cardiomegaly, pulmonary vascular congestion with increased interstitial markings. TTE on 5/5/ showed 55-60%, no MR, IVD dilated. Cardiology consulted. He underwent right heart catheterization on 01/11/21 with mild PAH likely secondary to OSA/OHS. He was placed on IV diuresis with good urine output and transitioned oral furosemide  '40mg'$  BID    Allergies  Allergen Reactions   Spironolactone Other (See Comments)    Reaction not recalled     There is no immunization history on file for this patient.  Past Medical History:  Diagnosis Date   CHF (congestive heart failure) ([facility])    Chronic kidney disease    Hypertension    Morbid obesity ([place])    Shortness of breath 03/30/2020   Sleep apnea    Bipap    Tobacco History: Social History   Tobacco Use  Smoking Status Never  Smokeless Tobacco Never   Counseling given: Not Answered   Outpatient Medications Prior to Visit  Medication Sig Dispense Refill   aspirin 81 MG chewable tablet Chew 81 mg by mouth daily.     carvedilol (COREG) 12.5 MG tablet Take 1 tablet (12.5 mg total) by mouth 2 (two) times daily with a meal. 180 tablet 3   ferrous sulfate 325 (65 FE) MG tablet Take 325 mg by mouth 2 (two) times daily with a meal.     furosemide (LASIX) 40 MG tablet Take 1 tablet (40 mg total) by mouth 2 (two) times daily. 180 tablet 0   OXYGEN Inhale 2 L/min into the lungs as needed (for shortness of breath).  rosuvastatin (CRESTOR) 10 MG tablet Take 1 tablet by mouth once daily 90 tablet 3   No facility-administered medications prior to visit.      Review of Systems  Review of Systems   Physical Exam  There were no vitals taken for this visit. Physical Exam   Lab Results:  CBC    Component Value Date/Time   WBC 7.8 01/06/2021 0408   RBC 5.10 01/06/2021 0408   HGB 13.6 01/11/2021 0845   HGB 14.3 01/11/2021 0845   HCT 40.0 01/11/2021 0845   HCT 42.0 01/11/2021 0845   PLT 175 01/06/2021  0408   MCV 89.0 01/06/2021 0408   MCH 28.6 01/06/2021 0408   MCHC 32.2 01/06/2021 0408   RDW 14.1 01/06/2021 0408   LYMPHSABS 1.2 01/05/2021 1418   MONOABS 0.7 01/05/2021 1418   EOSABS 0.4 01/05/2021 1418   BASOSABS 0.1 01/05/2021 1418    BMET    Component Value Date/Time   NA 138 01/20/2021 1618   NA 143 11/08/2020 1419   K 4.1 01/20/2021 1618   CL 98 01/20/2021 1618   CO2 32 01/20/2021 1618   GLUCOSE 90 01/20/2021 1618   BUN 23 (H) 01/20/2021 1618   BUN 26 11/08/2020 1419   CREATININE 1.57 (H) 01/20/2021 1618   CALCIUM 9.3 01/20/2021 1618   GFRNONAA 50 (L) 01/20/2021 1618   GFRAA 42 (L) 12/16/2019 1554    BNP    Component Value Date/Time   BNP 11.9 01/20/2021 1618    ProBNP No results found for: PROBNP  Imaging: No results found.   Assessment & Plan:   No problem-specific Assessment & Plan notes found for this encounter.     Martyn Ehrich, NP 03/01/2021

## 2021-03-03 DIAGNOSIS — G4733 Obstructive sleep apnea (adult) (pediatric): Secondary | ICD-10-CM | POA: Diagnosis not present

## 2021-03-04 DIAGNOSIS — G4733 Obstructive sleep apnea (adult) (pediatric): Secondary | ICD-10-CM | POA: Diagnosis not present

## 2021-03-04 DIAGNOSIS — E785 Hyperlipidemia, unspecified: Secondary | ICD-10-CM | POA: Diagnosis not present

## 2021-03-04 DIAGNOSIS — Z9181 History of falling: Secondary | ICD-10-CM | POA: Diagnosis not present

## 2021-03-04 DIAGNOSIS — I872 Venous insufficiency (chronic) (peripheral): Secondary | ICD-10-CM | POA: Diagnosis not present

## 2021-03-04 DIAGNOSIS — I130 Hypertensive heart and chronic kidney disease with heart failure and stage 1 through stage 4 chronic kidney disease, or unspecified chronic kidney disease: Secondary | ICD-10-CM | POA: Diagnosis not present

## 2021-03-04 DIAGNOSIS — N184 Chronic kidney disease, stage 4 (severe): Secondary | ICD-10-CM | POA: Diagnosis not present

## 2021-03-04 DIAGNOSIS — I5033 Acute on chronic diastolic (congestive) heart failure: Secondary | ICD-10-CM | POA: Diagnosis not present

## 2021-03-04 DIAGNOSIS — Z7982 Long term (current) use of aspirin: Secondary | ICD-10-CM | POA: Diagnosis not present

## 2021-03-04 DIAGNOSIS — N179 Acute kidney failure, unspecified: Secondary | ICD-10-CM | POA: Diagnosis not present

## 2021-03-04 DIAGNOSIS — Z9981 Dependence on supplemental oxygen: Secondary | ICD-10-CM | POA: Diagnosis not present

## 2021-03-04 DIAGNOSIS — J9621 Acute and chronic respiratory failure with hypoxia: Secondary | ICD-10-CM | POA: Diagnosis not present

## 2021-03-11 DIAGNOSIS — N179 Acute kidney failure, unspecified: Secondary | ICD-10-CM | POA: Diagnosis not present

## 2021-03-11 DIAGNOSIS — I509 Heart failure, unspecified: Secondary | ICD-10-CM | POA: Diagnosis not present

## 2021-03-11 DIAGNOSIS — I872 Venous insufficiency (chronic) (peripheral): Secondary | ICD-10-CM | POA: Diagnosis not present

## 2021-03-11 DIAGNOSIS — J9611 Chronic respiratory failure with hypoxia: Secondary | ICD-10-CM | POA: Diagnosis not present

## 2021-03-11 DIAGNOSIS — Z9981 Dependence on supplemental oxygen: Secondary | ICD-10-CM | POA: Diagnosis not present

## 2021-03-11 DIAGNOSIS — Z7982 Long term (current) use of aspirin: Secondary | ICD-10-CM | POA: Diagnosis not present

## 2021-03-11 DIAGNOSIS — N184 Chronic kidney disease, stage 4 (severe): Secondary | ICD-10-CM | POA: Diagnosis not present

## 2021-03-11 DIAGNOSIS — E785 Hyperlipidemia, unspecified: Secondary | ICD-10-CM | POA: Diagnosis not present

## 2021-03-11 DIAGNOSIS — I5033 Acute on chronic diastolic (congestive) heart failure: Secondary | ICD-10-CM | POA: Diagnosis not present

## 2021-03-11 DIAGNOSIS — G4733 Obstructive sleep apnea (adult) (pediatric): Secondary | ICD-10-CM | POA: Diagnosis not present

## 2021-03-11 DIAGNOSIS — Z9181 History of falling: Secondary | ICD-10-CM | POA: Diagnosis not present

## 2021-03-11 DIAGNOSIS — J9621 Acute and chronic respiratory failure with hypoxia: Secondary | ICD-10-CM | POA: Diagnosis not present

## 2021-03-11 DIAGNOSIS — I130 Hypertensive heart and chronic kidney disease with heart failure and stage 1 through stage 4 chronic kidney disease, or unspecified chronic kidney disease: Secondary | ICD-10-CM | POA: Diagnosis not present

## 2021-03-16 DIAGNOSIS — I5033 Acute on chronic diastolic (congestive) heart failure: Secondary | ICD-10-CM | POA: Diagnosis not present

## 2021-03-16 DIAGNOSIS — Z9981 Dependence on supplemental oxygen: Secondary | ICD-10-CM | POA: Diagnosis not present

## 2021-03-16 DIAGNOSIS — J9621 Acute and chronic respiratory failure with hypoxia: Secondary | ICD-10-CM | POA: Diagnosis not present

## 2021-03-16 DIAGNOSIS — I130 Hypertensive heart and chronic kidney disease with heart failure and stage 1 through stage 4 chronic kidney disease, or unspecified chronic kidney disease: Secondary | ICD-10-CM | POA: Diagnosis not present

## 2021-03-16 DIAGNOSIS — N184 Chronic kidney disease, stage 4 (severe): Secondary | ICD-10-CM | POA: Diagnosis not present

## 2021-03-16 DIAGNOSIS — Z7982 Long term (current) use of aspirin: Secondary | ICD-10-CM | POA: Diagnosis not present

## 2021-03-16 DIAGNOSIS — Z9181 History of falling: Secondary | ICD-10-CM | POA: Diagnosis not present

## 2021-03-16 DIAGNOSIS — I872 Venous insufficiency (chronic) (peripheral): Secondary | ICD-10-CM | POA: Diagnosis not present

## 2021-03-16 DIAGNOSIS — G4733 Obstructive sleep apnea (adult) (pediatric): Secondary | ICD-10-CM | POA: Diagnosis not present

## 2021-03-16 DIAGNOSIS — N179 Acute kidney failure, unspecified: Secondary | ICD-10-CM | POA: Diagnosis not present

## 2021-03-16 DIAGNOSIS — E785 Hyperlipidemia, unspecified: Secondary | ICD-10-CM | POA: Diagnosis not present

## 2021-03-26 DIAGNOSIS — G4733 Obstructive sleep apnea (adult) (pediatric): Secondary | ICD-10-CM | POA: Diagnosis not present

## 2021-04-05 ENCOUNTER — Telehealth: Payer: Self-pay | Admitting: Cardiology

## 2021-04-05 MED ORDER — FUROSEMIDE 40 MG PO TABS: 40.0000 mg | ORAL_TABLET | Freq: Two times a day (BID) | ORAL | 1 refills | Status: DC

## 2021-04-05 NOTE — Telephone Encounter (Signed)
*  STAT* If patient is at the pharmacy, call can be transferred to refill team.   1. Which medications need to be refilled? (please list name of each medication and dose if known)  furosemide (LASIX) 40 MG tablet  2. Which pharmacy/location (including street and city if local pharmacy) is medication to be sent to? [facility] ([place] [place] - [address]  3. Do they need a 30 day or 90 day supply? 90 with refills   Patient is out of medication.  The patient said he got this medication after a recent hospital visit

## 2021-04-11 DIAGNOSIS — J9611 Chronic respiratory failure with hypoxia: Secondary | ICD-10-CM | POA: Diagnosis not present

## 2021-04-11 DIAGNOSIS — I509 Heart failure, unspecified: Secondary | ICD-10-CM | POA: Diagnosis not present

## 2021-04-26 DIAGNOSIS — G4733 Obstructive sleep apnea (adult) (pediatric): Secondary | ICD-10-CM | POA: Diagnosis not present

## 2021-05-04 NOTE — Progress Notes (Deleted)
Cardiology Clinic Note   Patient Name: Aaron Hale Date of Encounter: 05/04/2021  Primary Care Provider:  Aretta Nip, MD Primary Cardiologist:  Donato Heinz, MD  Patient Profile    Aaron Hale 60 year old male presents the clinic today for follow-up evaluation of his chronic diastolic CHF and hypertension.  Past Medical History    Past Medical History:  Diagnosis Date   CHF (congestive heart failure) ([place])    Chronic kidney disease    Hypertension    Morbid obesity ([place])    Shortness of breath 03/30/2020   Sleep apnea    Bipap   Past Surgical History:  Procedure Laterality Date   RIGHT HEART CATH N/A 01/11/2021   Procedure: RIGHT HEART CATH;  Surgeon: Jolaine Artist, MD;  Location: [facility];  Service: Cardiovascular;  Laterality: N/A;    Allergies  Allergies  Allergen Reactions   Spironolactone Other (See Comments)    Reaction not recalled    History of Present Illness    Aaron Hale has a PMH of chronic diastolic CHF, essential hypertension, OSA on BiPAP, chronic renal insufficiency stage IV, morbid obesity, and hyperlipidemia.   He was admitted to the hospital on [id number] acute diastolic CHF and acute respiratory failure.  He also received treatment for lower extremity cellulitis.  He received IV diuresis and was transitioned to furosemide 40 mg p.o. twice daily.  He is CPAP intolerant.  His discharge weight was 394 pounds on 01/13/2021.   He followed up with the transitional care clinic on 01/20/2021.  He presented with his sister.  He reported feeling well.  He continued with shortness of breath on exertion.  He denied orthopnea and PND.  He was noted to be on 2 L of oxygen.  He reported that his appetite was okay.  He denied fever and chills.  His home weight was 388 pounds.  He reported compliance with his BiPAP and medications.  His echocardiogram 5/22 showed an EF of 55-60% with normal RV function.  His echocardiogram 2020 showed an  EF of 60-65% with normal right ventricular function.   He presented to the clinic 02/22/2021 for follow-up evaluation stated that he was doing well.  He was working with physical therapy once per week and completing the physical therapy exercises the other days of the week when physical therapy has not there.  He continued to watch his diet and avoid salt as much as he can.  His weight remained stable.  He continued to use 2 L of oxygen.  He was sent the wrong components with his BiPAP device.  It did not allow for supplemental oxygen to be attached.  He plan to start using his BiPAP device in the near future.  I asked him to maintain his physical activity, maintain his low-salt heart healthy diet, continue daily weights, and planned follow-up with Dr. Nechama Guard in 3 months.  He presents to the clinic today for follow-up evaluation states***   Today he denies chest pain, shortness of breath, lower extremity edema, fatigue, palpitations, melena, hematuria, hemoptysis, diaphoresis, weakness, presyncope, syncope, orthopnea, and PND.  Home Medications    Prior to Admission medications   Medication Sig Start Date End Date Taking? Authorizing Provider  aspirin 81 MG chewable tablet Chew 81 mg by mouth daily.    [provider]  carvedilol (COREG) 12.5 MG tablet Take 1 tablet (12.5 mg total) by mouth 2 (two) times daily with a meal. 08/11/20   Kilroy, Doreene Burke, PA-C  ferrous sulfate 325 (65 FE) MG tablet Take 325 mg by mouth 2 (two) times daily with a meal.    [provider]  furosemide (LASIX) 40 MG tablet Take 1 tablet (40 mg total) by mouth 2 (two) times daily. 04/05/21 07/04/21  Donato Heinz, MD  OXYGEN Inhale 2 L/min into the lungs as needed (for shortness of breath).    [provider]  rosuvastatin (CRESTOR) 10 MG tablet Take 1 tablet by mouth once daily 02/21/21   Donato Heinz, MD    Family History    Family History  Problem Relation Age of Onset   CAD  Mother    He indicated that the status of his mother is unknown.  Social History    Social History   Socioeconomic History   Marital status: Single    Spouse name: Not on file   Number of children: Not on file   Years of education: Not on file   Highest education level: Not on file  Occupational History   Occupation: retired  Tobacco Use   Smoking status: Never   Smokeless tobacco: Never  Vaping Use   Vaping Use: Never used  Substance and Sexual Activity   Alcohol use: Not Currently   Drug use: No   Sexual activity: Not Currently  Other Topics Concern   Not on file  Social History Narrative   Not on file   Social Determinants of Health   Financial Resource Strain: Low Risk    Difficulty of Paying Living Expenses: Not very hard  Food Insecurity: No Food Insecurity   Worried About Charity fundraiser in the Last Year: Never true   Ran Out of Food in the Last Year: Never true  Transportation Needs: No Transportation Needs   Lack of Transportation (Medical): No   Lack of Transportation (Non-Medical): No  Physical Activity: Not on file  Stress: Not on file  Social Connections: Not on file  Intimate Partner Violence: Not on file     Review of Systems    General:  No chills, fever, night sweats or weight changes.  Cardiovascular:  No chest pain, dyspnea on exertion, edema, orthopnea, palpitations, paroxysmal nocturnal dyspnea. Dermatological: No rash, lesions/masses Respiratory: No cough, dyspnea Urologic: No hematuria, dysuria Abdominal:   No nausea, vomiting, diarrhea, bright red blood per rectum, melena, or hematemesis Neurologic:  No visual changes, wkns, changes in mental status. All other systems reviewed and are otherwise negative except as noted above.  Physical Exam    VS:  There were no vitals taken for this visit. , BMI There is no height or weight on file to calculate BMI. GEN: Well nourished, well developed, in no acute distress. HEENT:  normal. Neck: Supple, no JVD, carotid bruits, or masses. Cardiac: RRR, no murmurs, rubs, or gallops. No clubbing, cyanosis, edema.  Radials/DP/PT 2+ and equal bilaterally.  Respiratory:  Respirations regular and unlabored, clear to auscultation bilaterally. GI: Soft, nontender, nondistended, BS + x 4. MS: no deformity or atrophy. Skin: warm and dry, no rash. Neuro:  Strength and sensation are intact. Psych: Normal affect.  Accessory Clinical Findings    Recent Labs: 01/05/2021: ALT 16 01/06/2021: Platelets 175 01/10/2021: Magnesium 2.5 01/11/2021: Hemoglobin 13.6; Hemoglobin 14.3 01/20/2021: B Natriuretic Peptide 11.9; BUN 23; Creatinine, Ser 1.57; Potassium 4.1; Sodium 138   Recent Lipid Panel    Component Value Date/Time   CHOL 130 01/08/2021 0148   TRIG 111 01/08/2021 0148   HDL 38 (L) 01/08/2021 0148  CHOLHDL 3.4 01/08/2021 0148   VLDL 22 01/08/2021 0148   LDLCALC 70 01/08/2021 0148    ECG personally reviewed by me today- *** - No acute changes  Echocardiogram 01/06/2021 IMPRESSIONS     1. Left ventricular ejection fraction, by estimation, is 55 to 60%. The  left ventricle has normal function. Left ventricular endocardial border  not optimally defined to evaluate regional wall motion. There is not well  visualized left ventricular  hypertrophy. Left ventricular diastolic parameters are indeterminate. Very  technically difficult study with globally preserved LV function.   2. Right ventricular systolic function was not well visualized. The right  ventricular size is not well visualized.   3. The mitral valve was not well visualized. No evidence of mitral valve  regurgitation.   4. The aortic valve was not well visualized. Aortic valve regurgitation  is not visualized.   5. The inferior vena cava is dilated in size with <50% respiratory  variability, suggesting right atrial pressure of 15 mmHg.   Comparison(s): A prior study was performed on 05/03/21. Prior images   reviewed side by side. Prior study also very limited but LV function  appears slightly lower from prior.   Assessment & Plan   1.   Chronic diastolic CHF-***no increased DOE or activity intolerance.  Continues on 2 L nasal cannula.  Weight today 389.  Echocardiogram 5/22 showed LVEF of 55-60% and intermediate diastolic parameters.  Continue furosemide, carvedilol Heart healthy low-sodium diet-salty 6 given Increase physical activity as tolerated   Essential hypertension-BP today ***130/78.  Well-controlled.120-130/80's Continue carvedilol Heart healthy low-sodium diet-salty 6 given Increase physical activity as tolerated   Hyperlipidemia-01/08/2021: Cholesterol 130; HDL 38; LDL Cholesterol 70; Triglycerides 111; VLDL 22 Continue aspirin, rosuvastatin Heart healthy low-sodium high-fiber diet.   Increase physical activity as tolerated   OSA-***sent wrong components with bipap initially. Planning to try in near future.  Continue BiPAP use Follows with pulmonology   Obesity-weight today 389 ***lbs Heart healthy low-sodium diet-salty 6 given Increase physical activity as tolerated   CKD stage IIIb-creatinine*** 1.57.  Baseline around 1.8 Follows with nephrology     Disposition: Follow-up with Dr. Gardiner Rhyme in 6 months.   Jossie Ng. Jahkeem Kurka NP-C    05/04/2021, [facility] [place] [address] Office [phone] Fax [phone]  Notice: This dictation was prepared with Dragon dictation along with smaller phrase technology. Any transcriptional errors that result from this process are unintentional and may not be corrected upon review.  I spent***minutes examining this patient, reviewing medications, and using patient centered shared decision making involving her cardiac care.  Prior to her visit I spent greater than 20 minutes reviewing her past medical history,  medications, and prior cardiac tests.

## 2021-05-10 ENCOUNTER — Ambulatory Visit: Payer: Medicare Other | Admitting: General Practice

## 2021-05-12 DIAGNOSIS — I509 Heart failure, unspecified: Secondary | ICD-10-CM | POA: Diagnosis not present

## 2021-05-12 DIAGNOSIS — J9611 Chronic respiratory failure with hypoxia: Secondary | ICD-10-CM | POA: Diagnosis not present

## 2021-05-21 DIAGNOSIS — J9601 Acute respiratory failure with hypoxia: Secondary | ICD-10-CM | POA: Diagnosis not present

## 2021-05-21 DIAGNOSIS — R0600 Dyspnea, unspecified: Secondary | ICD-10-CM | POA: Diagnosis not present

## 2021-05-21 DIAGNOSIS — R9401 Abnormal electroencephalogram [EEG]: Secondary | ICD-10-CM | POA: Diagnosis not present

## 2021-05-21 DIAGNOSIS — G9340 Encephalopathy, unspecified: Secondary | ICD-10-CM | POA: Diagnosis not present

## 2021-05-21 DIAGNOSIS — D649 Anemia, unspecified: Secondary | ICD-10-CM | POA: Diagnosis not present

## 2021-05-21 DIAGNOSIS — Z9981 Dependence on supplemental oxygen: Secondary | ICD-10-CM | POA: Diagnosis not present

## 2021-05-21 DIAGNOSIS — N179 Acute kidney failure, unspecified: Secondary | ICD-10-CM | POA: Diagnosis not present

## 2021-05-21 DIAGNOSIS — I130 Hypertensive heart and chronic kidney disease with heart failure and stage 1 through stage 4 chronic kidney disease, or unspecified chronic kidney disease: Secondary | ICD-10-CM | POA: Diagnosis not present

## 2021-05-21 DIAGNOSIS — R6521 Severe sepsis with septic shock: Secondary | ICD-10-CM | POA: Diagnosis not present

## 2021-05-21 DIAGNOSIS — I447 Left bundle-branch block, unspecified: Secondary | ICD-10-CM | POA: Diagnosis not present

## 2021-05-21 DIAGNOSIS — J9621 Acute and chronic respiratory failure with hypoxia: Secondary | ICD-10-CM | POA: Diagnosis not present

## 2021-05-21 DIAGNOSIS — A411 Sepsis due to other specified staphylococcus: Secondary | ICD-10-CM | POA: Diagnosis not present

## 2021-05-21 DIAGNOSIS — R0689 Other abnormalities of breathing: Secondary | ICD-10-CM | POA: Diagnosis not present

## 2021-05-21 DIAGNOSIS — M79605 Pain in left leg: Secondary | ICD-10-CM | POA: Diagnosis not present

## 2021-05-21 DIAGNOSIS — G4733 Obstructive sleep apnea (adult) (pediatric): Secondary | ICD-10-CM | POA: Diagnosis not present

## 2021-05-21 DIAGNOSIS — R7989 Other specified abnormal findings of blood chemistry: Secondary | ICD-10-CM | POA: Diagnosis not present

## 2021-05-21 DIAGNOSIS — J811 Chronic pulmonary edema: Secondary | ICD-10-CM | POA: Diagnosis not present

## 2021-05-21 DIAGNOSIS — J9602 Acute respiratory failure with hypercapnia: Secondary | ICD-10-CM | POA: Diagnosis not present

## 2021-05-21 DIAGNOSIS — R7881 Bacteremia: Secondary | ICD-10-CM | POA: Diagnosis not present

## 2021-05-21 DIAGNOSIS — N2581 Secondary hyperparathyroidism of renal origin: Secondary | ICD-10-CM | POA: Diagnosis not present

## 2021-05-21 DIAGNOSIS — G9341 Metabolic encephalopathy: Secondary | ICD-10-CM | POA: Diagnosis not present

## 2021-05-21 DIAGNOSIS — J22 Unspecified acute lower respiratory infection: Secondary | ICD-10-CM | POA: Diagnosis not present

## 2021-05-21 DIAGNOSIS — I509 Heart failure, unspecified: Secondary | ICD-10-CM | POA: Diagnosis not present

## 2021-05-21 DIAGNOSIS — N170 Acute kidney failure with tubular necrosis: Secondary | ICD-10-CM | POA: Diagnosis not present

## 2021-05-21 DIAGNOSIS — E871 Hypo-osmolality and hyponatremia: Secondary | ICD-10-CM | POA: Diagnosis not present

## 2021-05-21 DIAGNOSIS — E873 Alkalosis: Secondary | ICD-10-CM | POA: Diagnosis not present

## 2021-05-21 DIAGNOSIS — L309 Dermatitis, unspecified: Secondary | ICD-10-CM | POA: Diagnosis not present

## 2021-05-21 DIAGNOSIS — Z66 Do not resuscitate: Secondary | ICD-10-CM | POA: Diagnosis not present

## 2021-05-21 DIAGNOSIS — E8779 Other fluid overload: Secondary | ICD-10-CM | POA: Diagnosis not present

## 2021-05-21 DIAGNOSIS — E7800 Pure hypercholesterolemia, unspecified: Secondary | ICD-10-CM | POA: Diagnosis not present

## 2021-05-21 DIAGNOSIS — I5033 Acute on chronic diastolic (congestive) heart failure: Secondary | ICD-10-CM | POA: Diagnosis not present

## 2021-05-21 DIAGNOSIS — R0602 Shortness of breath: Secondary | ICD-10-CM | POA: Diagnosis not present

## 2021-05-21 DIAGNOSIS — Z743 Need for continuous supervision: Secondary | ICD-10-CM | POA: Diagnosis not present

## 2021-05-21 DIAGNOSIS — A419 Sepsis, unspecified organism: Secondary | ICD-10-CM | POA: Diagnosis not present

## 2021-05-21 DIAGNOSIS — M6281 Muscle weakness (generalized): Secondary | ICD-10-CM | POA: Diagnosis not present

## 2021-05-21 DIAGNOSIS — E876 Hypokalemia: Secondary | ICD-10-CM | POA: Diagnosis not present

## 2021-05-21 DIAGNOSIS — N183 Chronic kidney disease, stage 3 unspecified: Secondary | ICD-10-CM | POA: Diagnosis not present

## 2021-05-21 DIAGNOSIS — J90 Pleural effusion, not elsewhere classified: Secondary | ICD-10-CM | POA: Diagnosis not present

## 2021-05-21 DIAGNOSIS — R5383 Other fatigue: Secondary | ICD-10-CM | POA: Diagnosis not present

## 2021-05-21 DIAGNOSIS — I5021 Acute systolic (congestive) heart failure: Secondary | ICD-10-CM | POA: Diagnosis not present

## 2021-05-21 DIAGNOSIS — R4020 Unspecified coma: Secondary | ICD-10-CM | POA: Diagnosis not present

## 2021-05-21 DIAGNOSIS — E875 Hyperkalemia: Secondary | ICD-10-CM | POA: Diagnosis not present

## 2021-05-21 DIAGNOSIS — R2689 Other abnormalities of gait and mobility: Secondary | ICD-10-CM | POA: Diagnosis not present

## 2021-05-21 DIAGNOSIS — M79604 Pain in right leg: Secondary | ICD-10-CM | POA: Diagnosis not present

## 2021-05-21 DIAGNOSIS — E785 Hyperlipidemia, unspecified: Secondary | ICD-10-CM | POA: Diagnosis not present

## 2021-05-21 DIAGNOSIS — J9600 Acute respiratory failure, unspecified whether with hypoxia or hypercapnia: Secondary | ICD-10-CM | POA: Diagnosis not present

## 2021-05-21 DIAGNOSIS — I5032 Chronic diastolic (congestive) heart failure: Secondary | ICD-10-CM | POA: Diagnosis not present

## 2021-05-21 DIAGNOSIS — I5030 Unspecified diastolic (congestive) heart failure: Secondary | ICD-10-CM | POA: Diagnosis not present

## 2021-05-21 DIAGNOSIS — T368X5A Adverse effect of other systemic antibiotics, initial encounter: Secondary | ICD-10-CM | POA: Diagnosis not present

## 2021-05-21 DIAGNOSIS — I2781 Cor pulmonale (chronic): Secondary | ICD-10-CM | POA: Diagnosis not present

## 2021-05-21 DIAGNOSIS — I5082 Biventricular heart failure: Secondary | ICD-10-CM | POA: Diagnosis not present

## 2021-05-21 DIAGNOSIS — J9611 Chronic respiratory failure with hypoxia: Secondary | ICD-10-CM | POA: Diagnosis not present

## 2021-05-21 DIAGNOSIS — J449 Chronic obstructive pulmonary disease, unspecified: Secondary | ICD-10-CM | POA: Diagnosis not present

## 2021-05-21 DIAGNOSIS — I5031 Acute diastolic (congestive) heart failure: Secondary | ICD-10-CM | POA: Diagnosis not present

## 2021-05-21 DIAGNOSIS — I10 Essential (primary) hypertension: Secondary | ICD-10-CM | POA: Diagnosis not present

## 2021-05-22 DIAGNOSIS — M79604 Pain in right leg: Secondary | ICD-10-CM | POA: Diagnosis not present

## 2021-05-22 DIAGNOSIS — I5033 Acute on chronic diastolic (congestive) heart failure: Secondary | ICD-10-CM | POA: Diagnosis not present

## 2021-05-22 DIAGNOSIS — J9600 Acute respiratory failure, unspecified whether with hypoxia or hypercapnia: Secondary | ICD-10-CM | POA: Diagnosis not present

## 2021-05-22 DIAGNOSIS — I5021 Acute systolic (congestive) heart failure: Secondary | ICD-10-CM | POA: Diagnosis not present

## 2021-05-22 DIAGNOSIS — J9602 Acute respiratory failure with hypercapnia: Secondary | ICD-10-CM | POA: Diagnosis not present

## 2021-05-22 DIAGNOSIS — I2781 Cor pulmonale (chronic): Secondary | ICD-10-CM | POA: Diagnosis not present

## 2021-05-22 DIAGNOSIS — R0602 Shortness of breath: Secondary | ICD-10-CM | POA: Diagnosis not present

## 2021-05-22 DIAGNOSIS — J449 Chronic obstructive pulmonary disease, unspecified: Secondary | ICD-10-CM | POA: Diagnosis not present

## 2021-05-22 DIAGNOSIS — M79605 Pain in left leg: Secondary | ICD-10-CM | POA: Diagnosis not present

## 2021-05-23 DIAGNOSIS — R7881 Bacteremia: Secondary | ICD-10-CM | POA: Diagnosis not present

## 2021-05-23 DIAGNOSIS — A419 Sepsis, unspecified organism: Secondary | ICD-10-CM | POA: Diagnosis not present

## 2021-05-23 DIAGNOSIS — J9600 Acute respiratory failure, unspecified whether with hypoxia or hypercapnia: Secondary | ICD-10-CM | POA: Diagnosis not present

## 2021-05-24 DIAGNOSIS — J9600 Acute respiratory failure, unspecified whether with hypoxia or hypercapnia: Secondary | ICD-10-CM | POA: Diagnosis not present

## 2021-05-24 DIAGNOSIS — R7881 Bacteremia: Secondary | ICD-10-CM | POA: Diagnosis not present

## 2021-05-24 DIAGNOSIS — R7989 Other specified abnormal findings of blood chemistry: Secondary | ICD-10-CM | POA: Diagnosis not present

## 2021-05-24 DIAGNOSIS — A419 Sepsis, unspecified organism: Secondary | ICD-10-CM | POA: Diagnosis not present

## 2021-05-25 DIAGNOSIS — R7881 Bacteremia: Secondary | ICD-10-CM | POA: Diagnosis not present

## 2021-05-25 DIAGNOSIS — J9600 Acute respiratory failure, unspecified whether with hypoxia or hypercapnia: Secondary | ICD-10-CM | POA: Diagnosis not present

## 2021-05-25 DIAGNOSIS — A419 Sepsis, unspecified organism: Secondary | ICD-10-CM | POA: Diagnosis not present

## 2021-05-26 DIAGNOSIS — A419 Sepsis, unspecified organism: Secondary | ICD-10-CM | POA: Diagnosis not present

## 2021-05-26 DIAGNOSIS — R7881 Bacteremia: Secondary | ICD-10-CM | POA: Diagnosis not present

## 2021-05-26 DIAGNOSIS — J9600 Acute respiratory failure, unspecified whether with hypoxia or hypercapnia: Secondary | ICD-10-CM | POA: Diagnosis not present

## 2021-05-27 DIAGNOSIS — R7881 Bacteremia: Secondary | ICD-10-CM | POA: Diagnosis not present

## 2021-05-27 DIAGNOSIS — R0600 Dyspnea, unspecified: Secondary | ICD-10-CM | POA: Diagnosis not present

## 2021-05-27 DIAGNOSIS — A419 Sepsis, unspecified organism: Secondary | ICD-10-CM | POA: Diagnosis not present

## 2021-05-27 DIAGNOSIS — J9600 Acute respiratory failure, unspecified whether with hypoxia or hypercapnia: Secondary | ICD-10-CM | POA: Diagnosis not present

## 2021-05-27 DIAGNOSIS — G4733 Obstructive sleep apnea (adult) (pediatric): Secondary | ICD-10-CM | POA: Diagnosis not present

## 2021-05-28 DIAGNOSIS — J9600 Acute respiratory failure, unspecified whether with hypoxia or hypercapnia: Secondary | ICD-10-CM | POA: Diagnosis not present

## 2021-05-28 DIAGNOSIS — A419 Sepsis, unspecified organism: Secondary | ICD-10-CM | POA: Diagnosis not present

## 2021-05-28 DIAGNOSIS — R7881 Bacteremia: Secondary | ICD-10-CM | POA: Diagnosis not present

## 2021-05-29 DIAGNOSIS — J9600 Acute respiratory failure, unspecified whether with hypoxia or hypercapnia: Secondary | ICD-10-CM | POA: Diagnosis not present

## 2021-05-29 DIAGNOSIS — A419 Sepsis, unspecified organism: Secondary | ICD-10-CM | POA: Diagnosis not present

## 2021-05-29 DIAGNOSIS — R7881 Bacteremia: Secondary | ICD-10-CM | POA: Diagnosis not present

## 2021-06-12 IMAGING — CT CT CHEST HIGH RESOLUTION W/O CM
2 of 7 series · 14 of 36 positions shown, 17 images · non-contrast
Comparison: 05/03/2019 chest radiograph.

CLINICAL DATA: Intermittent dyspnea.  Sleep apnea.

EXAM:
CT CHEST WITHOUT CONTRAST
TECHNIQUE: Multidetector CT imaging of the chest was performed following the
standard protocol without intravenous contrast. High resolution
imaging of the lungs, as well as inspiratory and expiratory imaging,
was performed.

[Series 4: high resolution · axial · 0.84mm/px · z∈[-318,-27]mm · 11 of 351 slices shown, 14 images]
[im 30/351  mediastinal]
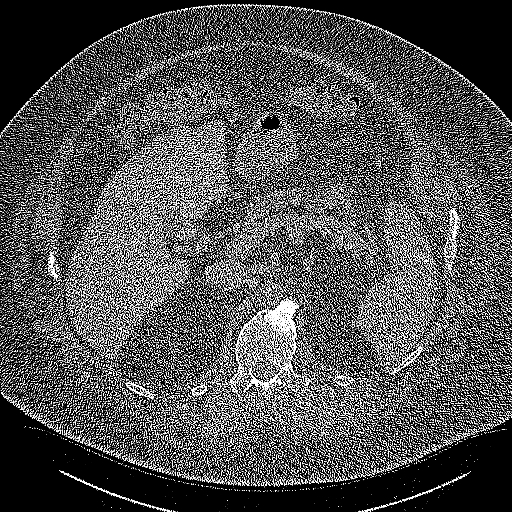
[im 30/351  lung]
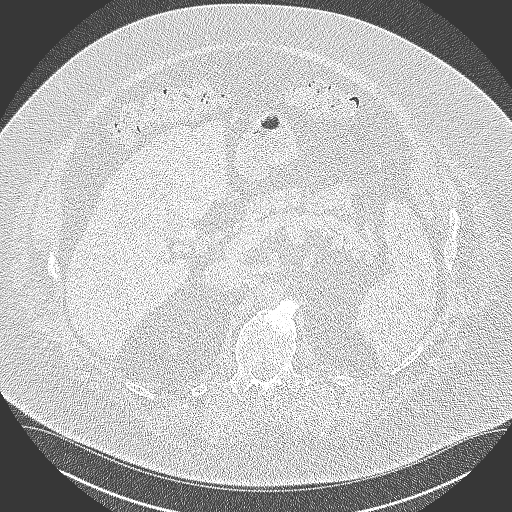
[im 59/351  lung]
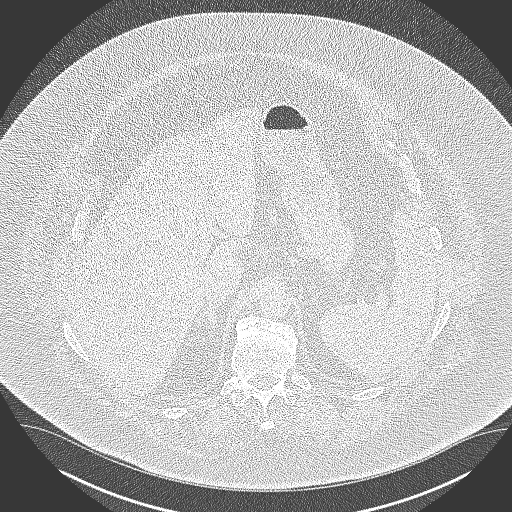
[im 88/351  lung]
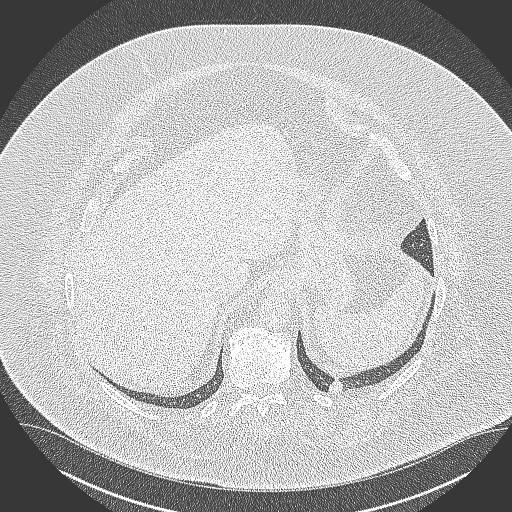
[im 117/351  lung]
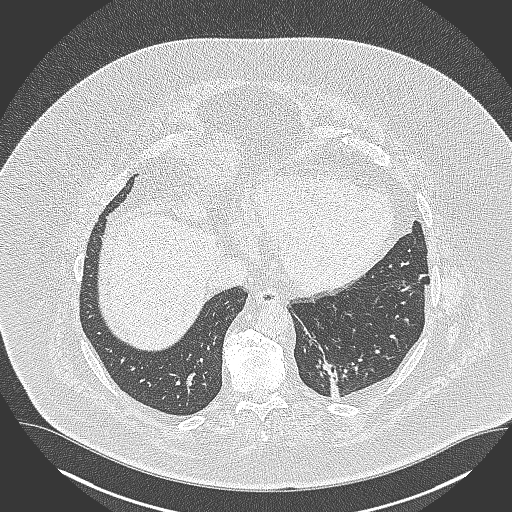
[im 146/351  mediastinal]
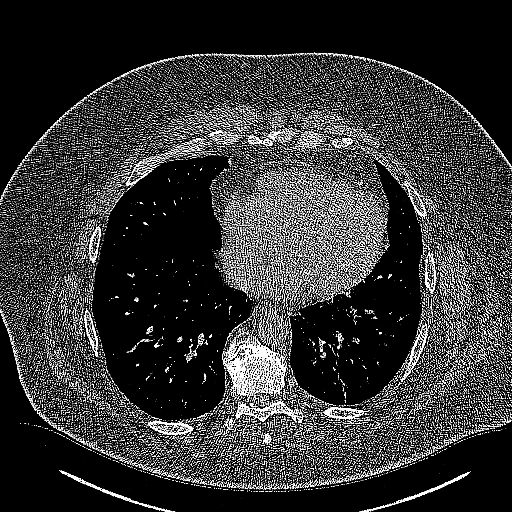
[im 146/351  lung]
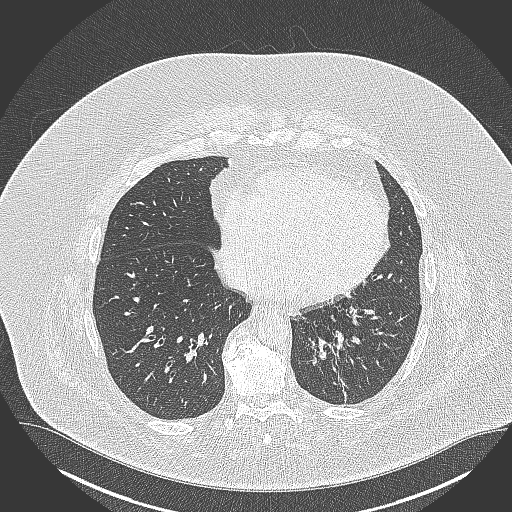
[im 176/351  lung]
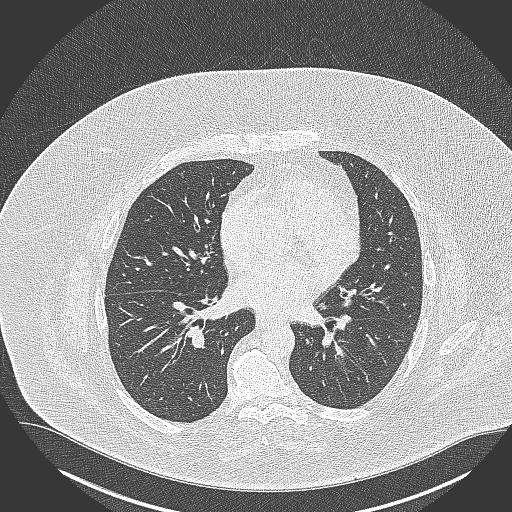
[im 205/351  lung]
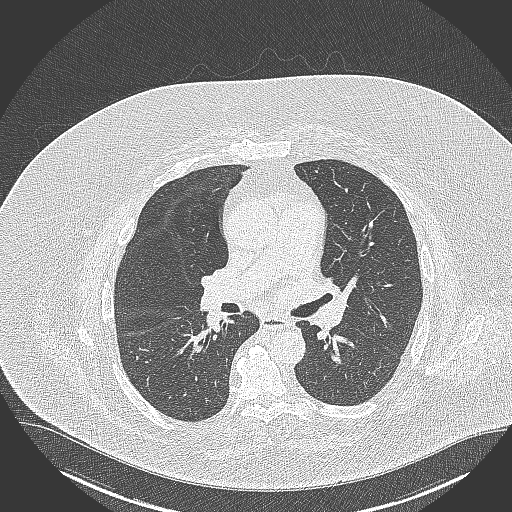
[im 234/351  lung]
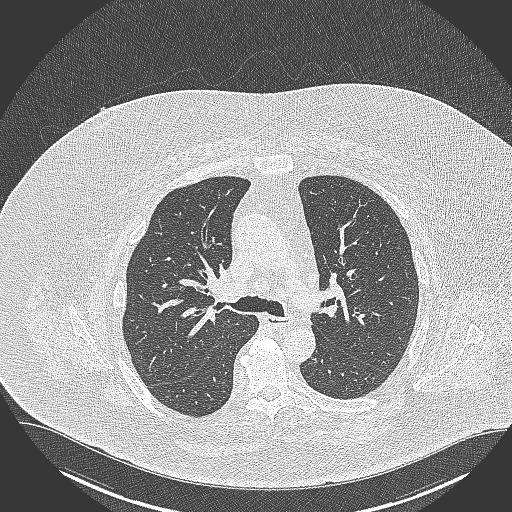
[im 263/351  mediastinal]
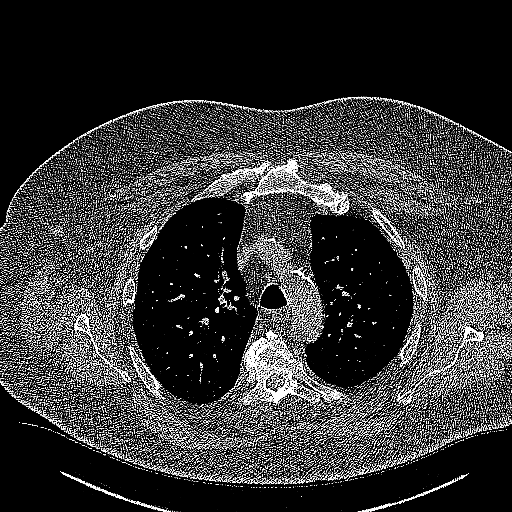
[im 263/351  lung]
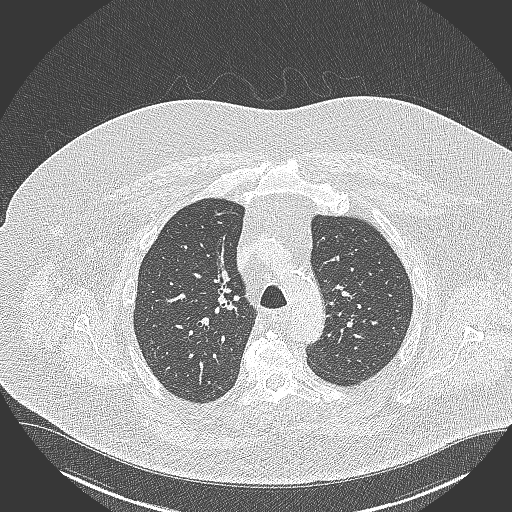
[im 292/351  lung]
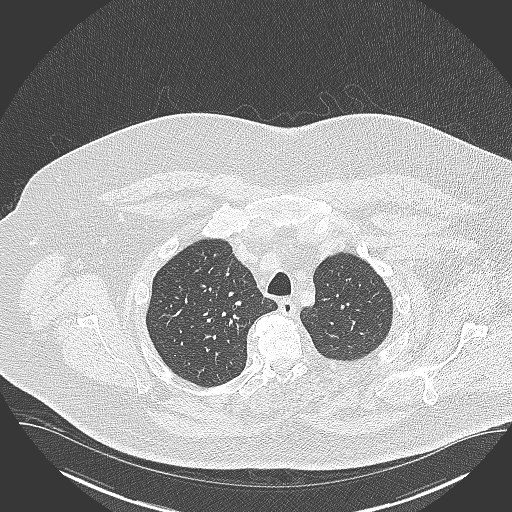
[im 321/351  lung]
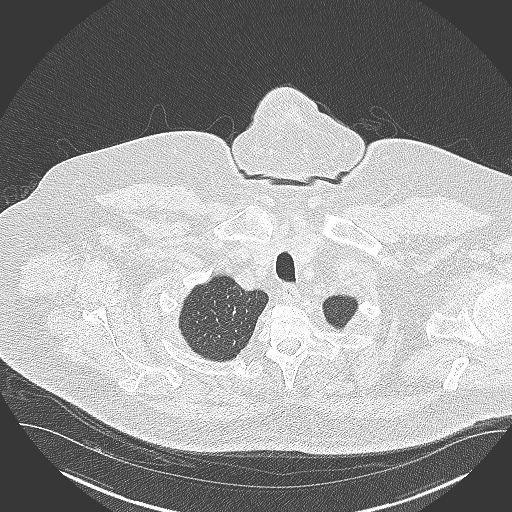

[Series 6: coronal · coronal · 0.70mm/px · 3 of 150 slices shown]
[im 30/150  lung]
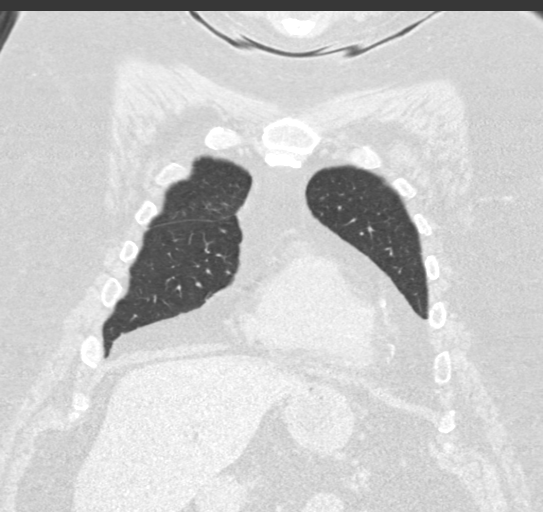
[im 60/150  lung]
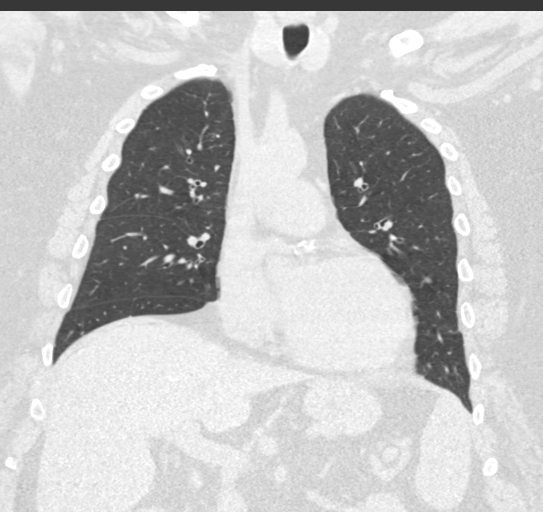
[im 90/150  lung]
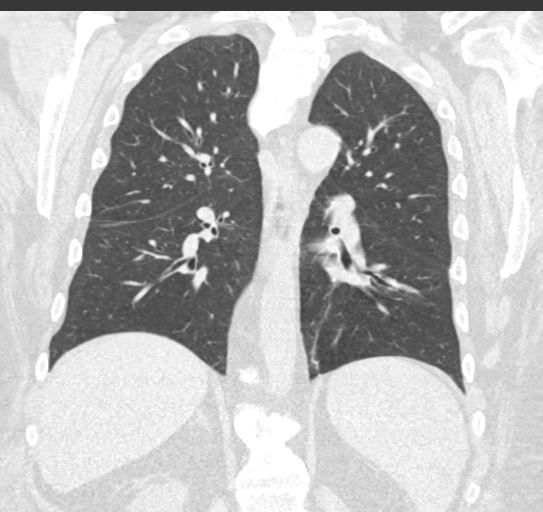

[14 of 36 positions shown; findings below may reference images not displayed]

FINDINGS: Cardiovascular: Normal heart size. No significant pericardial
effusion/thickening. Three-vessel coronary atherosclerosis.
Atherosclerotic nonaneurysmal thoracic aorta. Dilated main pulmonary
artery (3.6 cm diameter).

Mediastinum/Nodes: No discrete thyroid nodules. Unremarkable
esophagus. No pathologically enlarged axillary, mediastinal or hilar
lymph nodes, noting limited sensitivity for the detection of hilar
adenopathy on this noncontrast study.

Lungs/Pleura: No pneumothorax. No pleural effusion. No acute
consolidative airspace disease, lung masses or significant pulmonary
nodules. A few scattered mildly thickened parenchymal bands at the
left lung base. Diffuse bronchial wall thickening. No significant
regions of subpleural reticulation, ground-glass attenuation,
traction bronchiectasis, architectural distortion or frank
honeycombing. No significant air trapping or evidence of
tracheobronchomalacia on the expiration sequence.

Upper abdomen: Cholelithiasis.

Musculoskeletal: No aggressive appearing focal osseous lesions.
Marked thoracic spondylosis.
IMPRESSION: 1. No evidence of interstitial lung disease. Small parenchymal bands
at the left lung base, compatible with nonspecific mild
postinfectious/postinflammatory scarring.
2. Nonspecific diffuse bronchial wall thickening, as can be seen
with chronic bronchitis or reactive airways disease.
3. Dilated main pulmonary artery, suggesting pulmonary arterial
hypertension.
4. Three-vessel coronary atherosclerosis.
5. Cholelithiasis.
6. Aortic Atherosclerosis (88QPM-17P.P).

## 2021-06-16 DIAGNOSIS — G9340 Encephalopathy, unspecified: Secondary | ICD-10-CM | POA: Diagnosis not present

## 2021-06-16 DIAGNOSIS — A419 Sepsis, unspecified organism: Secondary | ICD-10-CM | POA: Diagnosis not present

## 2021-06-16 DIAGNOSIS — I5031 Acute diastolic (congestive) heart failure: Secondary | ICD-10-CM | POA: Diagnosis not present

## 2021-06-16 DIAGNOSIS — I5030 Unspecified diastolic (congestive) heart failure: Secondary | ICD-10-CM | POA: Diagnosis not present

## 2021-06-16 DIAGNOSIS — Z7409 Other reduced mobility: Secondary | ICD-10-CM | POA: Diagnosis not present

## 2021-06-16 DIAGNOSIS — I10 Essential (primary) hypertension: Secondary | ICD-10-CM | POA: Diagnosis not present

## 2021-06-16 DIAGNOSIS — G4733 Obstructive sleep apnea (adult) (pediatric): Secondary | ICD-10-CM | POA: Diagnosis not present

## 2021-06-16 DIAGNOSIS — R2689 Other abnormalities of gait and mobility: Secondary | ICD-10-CM | POA: Diagnosis not present

## 2021-06-16 DIAGNOSIS — R531 Weakness: Secondary | ICD-10-CM | POA: Diagnosis not present

## 2021-06-16 DIAGNOSIS — E785 Hyperlipidemia, unspecified: Secondary | ICD-10-CM | POA: Diagnosis not present

## 2021-06-16 DIAGNOSIS — R7881 Bacteremia: Secondary | ICD-10-CM | POA: Diagnosis not present

## 2021-06-16 DIAGNOSIS — M6281 Muscle weakness (generalized): Secondary | ICD-10-CM | POA: Diagnosis not present

## 2021-06-16 DIAGNOSIS — J22 Unspecified acute lower respiratory infection: Secondary | ICD-10-CM | POA: Diagnosis not present

## 2021-06-16 DIAGNOSIS — Z9981 Dependence on supplemental oxygen: Secondary | ICD-10-CM | POA: Diagnosis not present

## 2021-06-16 DIAGNOSIS — J9601 Acute respiratory failure with hypoxia: Secondary | ICD-10-CM | POA: Diagnosis not present

## 2021-06-16 DIAGNOSIS — N179 Acute kidney failure, unspecified: Secondary | ICD-10-CM | POA: Diagnosis not present

## 2021-06-16 DIAGNOSIS — L309 Dermatitis, unspecified: Secondary | ICD-10-CM | POA: Diagnosis not present

## 2021-06-16 DIAGNOSIS — Z789 Other specified health status: Secondary | ICD-10-CM | POA: Diagnosis not present

## 2021-06-16 DIAGNOSIS — R269 Unspecified abnormalities of gait and mobility: Secondary | ICD-10-CM | POA: Diagnosis not present

## 2021-06-16 DIAGNOSIS — I509 Heart failure, unspecified: Secondary | ICD-10-CM | POA: Diagnosis not present

## 2021-06-16 DIAGNOSIS — J9600 Acute respiratory failure, unspecified whether with hypoxia or hypercapnia: Secondary | ICD-10-CM | POA: Diagnosis not present

## 2021-06-21 ENCOUNTER — Ambulatory Visit (HOSPITAL_BASED_OUTPATIENT_CLINIC_OR_DEPARTMENT_OTHER): Payer: Medicare Other | Admitting: Family

## 2021-06-21 DIAGNOSIS — E785 Hyperlipidemia, unspecified: Secondary | ICD-10-CM | POA: Diagnosis not present

## 2021-06-21 DIAGNOSIS — R531 Weakness: Secondary | ICD-10-CM | POA: Diagnosis not present

## 2021-06-21 DIAGNOSIS — R269 Unspecified abnormalities of gait and mobility: Secondary | ICD-10-CM | POA: Diagnosis not present

## 2021-06-21 DIAGNOSIS — Z7409 Other reduced mobility: Secondary | ICD-10-CM | POA: Diagnosis not present

## 2021-06-21 DIAGNOSIS — I10 Essential (primary) hypertension: Secondary | ICD-10-CM | POA: Diagnosis not present

## 2021-06-21 DIAGNOSIS — I5030 Unspecified diastolic (congestive) heart failure: Secondary | ICD-10-CM | POA: Diagnosis not present

## 2021-06-21 DIAGNOSIS — N179 Acute kidney failure, unspecified: Secondary | ICD-10-CM | POA: Diagnosis not present

## 2021-06-21 DIAGNOSIS — Z789 Other specified health status: Secondary | ICD-10-CM | POA: Diagnosis not present

## 2021-06-24 DIAGNOSIS — R531 Weakness: Secondary | ICD-10-CM | POA: Diagnosis not present

## 2021-06-24 DIAGNOSIS — Z7409 Other reduced mobility: Secondary | ICD-10-CM | POA: Diagnosis not present

## 2021-06-24 DIAGNOSIS — Z789 Other specified health status: Secondary | ICD-10-CM | POA: Diagnosis not present

## 2021-06-24 DIAGNOSIS — R269 Unspecified abnormalities of gait and mobility: Secondary | ICD-10-CM | POA: Diagnosis not present

## 2021-06-24 DIAGNOSIS — I5030 Unspecified diastolic (congestive) heart failure: Secondary | ICD-10-CM | POA: Diagnosis not present

## 2021-06-26 DIAGNOSIS — G4733 Obstructive sleep apnea (adult) (pediatric): Secondary | ICD-10-CM | POA: Diagnosis not present

## 2021-06-28 DIAGNOSIS — Z7409 Other reduced mobility: Secondary | ICD-10-CM | POA: Diagnosis not present

## 2021-06-28 DIAGNOSIS — R269 Unspecified abnormalities of gait and mobility: Secondary | ICD-10-CM | POA: Diagnosis not present

## 2021-06-28 DIAGNOSIS — R531 Weakness: Secondary | ICD-10-CM | POA: Diagnosis not present

## 2021-06-28 DIAGNOSIS — I5030 Unspecified diastolic (congestive) heart failure: Secondary | ICD-10-CM | POA: Diagnosis not present

## 2021-06-28 DIAGNOSIS — Z789 Other specified health status: Secondary | ICD-10-CM | POA: Diagnosis not present

## 2021-06-30 ENCOUNTER — Ambulatory Visit: Payer: Medicare Other | Admitting: Physician Assistant

## 2021-07-12 DIAGNOSIS — I509 Heart failure, unspecified: Secondary | ICD-10-CM | POA: Diagnosis not present

## 2021-07-12 DIAGNOSIS — J9611 Chronic respiratory failure with hypoxia: Secondary | ICD-10-CM | POA: Diagnosis not present

## 2021-07-25 DIAGNOSIS — I5030 Unspecified diastolic (congestive) heart failure: Secondary | ICD-10-CM | POA: Diagnosis not present

## 2021-07-25 DIAGNOSIS — G4730 Sleep apnea, unspecified: Secondary | ICD-10-CM | POA: Diagnosis not present

## 2021-07-25 DIAGNOSIS — I129 Hypertensive chronic kidney disease with stage 1 through stage 4 chronic kidney disease, or unspecified chronic kidney disease: Secondary | ICD-10-CM | POA: Diagnosis not present

## 2021-07-25 DIAGNOSIS — N184 Chronic kidney disease, stage 4 (severe): Secondary | ICD-10-CM | POA: Diagnosis not present

## 2021-08-01 DIAGNOSIS — M6281 Muscle weakness (generalized): Secondary | ICD-10-CM | POA: Diagnosis not present

## 2021-08-01 DIAGNOSIS — I5030 Unspecified diastolic (congestive) heart failure: Secondary | ICD-10-CM | POA: Diagnosis not present

## 2021-08-01 DIAGNOSIS — N179 Acute kidney failure, unspecified: Secondary | ICD-10-CM | POA: Diagnosis not present

## 2021-08-01 DIAGNOSIS — R2689 Other abnormalities of gait and mobility: Secondary | ICD-10-CM | POA: Diagnosis not present

## 2021-08-11 DIAGNOSIS — J9611 Chronic respiratory failure with hypoxia: Secondary | ICD-10-CM | POA: Diagnosis not present

## 2021-08-11 DIAGNOSIS — I509 Heart failure, unspecified: Secondary | ICD-10-CM | POA: Diagnosis not present

## 2021-08-15 NOTE — Progress Notes (Signed)
Cardiology Clinic Note   Patient Name: Aaron Hale Date of Encounter: 08/16/2021  Primary Care Provider:  Aretta Nip, MD Primary Cardiologist:  Donato Heinz, MD  Patient Profile    Aaron Hale 60 year old male presents the clinic today for follow-up evaluation of his chronic diastolic CHF .  Past Medical History    Past Medical History:  Diagnosis Date   CHF (congestive heart failure) ([place])    Chronic kidney disease    Hypertension    Morbid obesity ([place])    Shortness of breath 03/30/2020   Sleep apnea    Bipap   Past Surgical History:  Procedure Laterality Date   RIGHT HEART CATH N/A 01/11/2021   Procedure: RIGHT HEART CATH;  Surgeon: Jolaine Artist, MD;  Location: [facility];  Service: Cardiovascular;  Laterality: N/A;    Allergies  Allergies  Allergen Reactions   Spironolactone Other (See Comments)    Reaction not recalled    History of Present Illness    Aaron Hale has a PMH of chronic diastolic CHF, essential hypertension, OSA on BiPAP, chronic renal insufficiency stage IV, morbid obesity, and hyperlipidemia.  He was admitted to the hospital on 09/09/1094 acute diastolic CHF and acute respiratory failure.  He also received treatment for lower extremity cellulitis.  He received IV diuresis and was transitioned to furosemide 40 mg p.o. twice daily.  He is CPAP intolerant.  His discharge weight was 394 pounds on 01/13/2021.  He followed up with the transitional care clinic on 01/20/2021.  He presented with his sister.  He reported feeling well.  He continued with shortness of breath on exertion.  He denied orthopnea and PND.  He was noted to be on 2 L of oxygen.  He reported that his appetite was okay.  He denied fever and chills.  His home weight was 388 pounds.  He reported compliance with his BiPAP and medications.  His echocardiogram 5/22 showed an EF of 55-60% with normal RV function.  His echocardiogram 2020 showed an EF of 60-65%  with normal right ventricular function.  He presented to the clinic 02/22/2021 today for follow-up evaluation stated that he was doing well.  He was working with physical therapy once per week and completing physical therapy exercises the other days of the week when physical therapy has not there.  He continued to watch his diet and avoid salt as much as he could.  His weight remained stable.  He continued to use 2 L of oxygen.  He had been sent the wrong components with his BiPAP device.  It did not allow for supplemental oxygen to be attached.  He to start using his BiPAP device in the near future.  I asked him to maintain his physical activity, maintain his low-salt heart healthy diet, continue daily weights, and follow-up with Dr. Alda Lea in 3 months.  He presents to the clinic today for follow-up evaluation and states he was admitted to [facility] from mid September through October.  He reports that he had a bloodstream infection, required intubation, and then went to inpatient rehab after his admission.  At this point his mother reports that he has been very sedentary.  We discussed the importance of good diet, mobility, medication compliance, and following up regularly.  I have encouraged him to begin doing 1 pound of his physical therapy exercises progressing slowly as tolerated.  He continues to use 2 L of oxygen intermittently through the day  at home.  He is compliant with his CPAP.  He has noted that his weight has increased since his hospital discharge because he is eating much better.  I will give him a salty 6 diet sheet, have him reduce the calories in his diet, give him like stretching exercises for his lower extremities, continue his current medication regimen, and plan follow-up in 2 to 3 months.  Today he denies chest pain, shortness of breath, lower extremity edema, fatigue, palpitations, melena, hematuria, hemoptysis, diaphoresis, weakness,  presyncope, syncope, orthopnea, and PND.     Home Medications    Prior to Admission medications   Medication Sig Start Date End Date Taking? Authorizing Provider  aspirin 81 MG chewable tablet Chew 81 mg by mouth daily.    [provider]  carvedilol (COREG) 12.5 MG tablet Take 1 tablet (12.5 mg total) by mouth 2 (two) times daily with a meal. 08/11/20   Kilroy, Doreene Burke, PA-C  ferrous sulfate 325 (65 FE) MG tablet Take 325 mg by mouth 2 (two) times daily with a meal.    [provider]  furosemide (LASIX) 40 MG tablet Take 1 tablet (40 mg total) by mouth 2 (two) times daily. 01/13/21 04/13/21  [place] Donnamarie Poag, DO  OXYGEN Inhale 2 L/min into the lungs as needed (for shortness of breath).    [provider]  rosuvastatin (CRESTOR) 10 MG tablet Take 1 tablet by mouth once daily 11/09/20   Donato Heinz, MD    Family History    Family History  Problem Relation Age of Onset   CAD Mother    He indicated that the status of his mother is unknown.  Social History    Social History   Socioeconomic History   Marital status: Single    Spouse name: Not on file   Number of children: Not on file   Years of education: Not on file   Highest education level: Not on file  Occupational History   Occupation: retired  Tobacco Use   Smoking status: Never   Smokeless tobacco: Never  Vaping Use   Vaping Use: Never used  Substance and Sexual Activity   Alcohol use: Not Currently   Drug use: No   Sexual activity: Not Currently  Other Topics Concern   Not on file  Social History Narrative   Not on file   Social Determinants of Health   Financial Resource Strain: Low Risk    Difficulty of Paying Living Expenses: Not very hard  Food Insecurity: No Food Insecurity   Worried About Charity fundraiser in the Last Year: Never true   Ran Out of Food in the Last Year: Never true  Transportation Needs: No Transportation Needs   Lack of Transportation (Medical): No    Lack of Transportation (Non-Medical): No  Physical Activity: Not on file  Stress: Not on file  Social Connections: Not on file  Intimate Partner Violence: Not on file     Review of Systems    General:  No chills, fever, night sweats or weight changes.  Cardiovascular:  No chest pain, dyspnea on exertion, edema, orthopnea, palpitations, paroxysmal nocturnal dyspnea. Dermatological: No rash, lesions/masses Respiratory: No cough, dyspnea Urologic: No hematuria, dysuria Abdominal:   No nausea, vomiting, diarrhea, bright red blood per rectum, melena, or hematemesis Neurologic:  No visual changes, wkns, changes in mental status. All other systems reviewed and are otherwise negative except as noted above.  Physical Exam    VS:  BP 136/68 (  BP Location: Left Arm, Patient Position: Sitting, Cuff Size: Large)   Pulse 67   Ht 5\' 11"  (1.803 m)   Wt (!) 393 lb (178.3 kg)   SpO2 (!) 84%   BMI 54.81 kg/m  , BMI Body mass index is 54.81 kg/m. GEN: Well nourished, well developed, in no acute distress. HEENT: normal. Neck: Supple, no JVD, carotid bruits, or masses. Cardiac: RRR, no murmurs, rubs, or gallops. No clubbing, cyanosis, edema.  Radials/DP/PT 2+ and equal bilaterally.  Respiratory:  Respirations regular and unlabored, clear to auscultation bilaterally. GI: Soft, nontender, nondistended, BS + x 4. MS: no deformity or atrophy. Skin: warm and dry, no rash. Neuro:  Strength and sensation are intact. Psych: Normal affect.  Accessory Clinical Findings    Recent Labs: 01/05/2021: ALT 16 01/06/2021: Platelets 175 01/10/2021: Magnesium 2.5 01/11/2021: Hemoglobin 13.6; Hemoglobin 14.3 01/20/2021: B Natriuretic Peptide 11.9; BUN 23; Creatinine, Ser 1.57; Potassium 4.1; Sodium 138   Recent Lipid Panel    Component Value Date/Time   CHOL 130 01/08/2021 0148   TRIG 111 01/08/2021 0148   HDL 38 (L) 01/08/2021 0148   CHOLHDL 3.4 01/08/2021 0148   VLDL 22 01/08/2021 0148   LDLCALC 70  01/08/2021 0148    ECG personally reviewed by me today- none today.   Echocardiogram 01/06/2021 IMPRESSIONS     1. Left ventricular ejection fraction, by estimation, is 55 to 60%. The  left ventricle has normal function. Left ventricular endocardial border  not optimally defined to evaluate regional wall motion. There is not well  visualized left ventricular  hypertrophy. Left ventricular diastolic parameters are indeterminate. Very  technically difficult study with globally preserved LV function.   2. Right ventricular systolic function was not well visualized. The right  ventricular size is not well visualized.   3. The mitral valve was not well visualized. No evidence of mitral valve  regurgitation.   4. The aortic valve was not well visualized. Aortic valve regurgitation  is not visualized.   5. The inferior vena cava is dilated in size with <50% respiratory  variability, suggesting right atrial pressure of 15 mmHg.   Comparison(s): A prior study was performed on 05/03/21. Prior images  reviewed side by side. Prior study also very limited but LV function  appears slightly lower from prior.    Assessment & Plan   1.  Chronic diastolic CHF-euvolemic today.  No increased dyspnea.  Continues on 2 L nasal cannula.  Weight today 393 pounds echocardiogram 5/22 showed LVEF of 55-60% and intermediate diastolic parameters.  Continue furosemide, carvedilol Heart healthy low-sodium diet-salty 6 reviewed Increase physical activity as tolerated  Essential hypertension-BP today 136/68 well-controlled at home. Continue carvedilol Heart healthy low-sodium diet Increase physical activity as tolerated  Hyperlipidemia-01/08/2021: Cholesterol 130; HDL 38; LDL Cholesterol 70; Triglycerides 111; VLDL 22 Continue aspirin, rosuvastatin Heart healthy low-sodium high-fiber diet.   Increase physical activity as tolerated  OSA-compliant.  Received the correct components for his machine. Continue  CPAP use.   Follows with pulmonology  Obesity-weight today 393.  Plans to reduce calories in diet. Heart healthy low-sodium diet Increase physical activity as tolerated   Disposition: Follow-up with Dr. Gardiner Rhyme in 2-3 months.   Jossie Ng. Vertis Scheib NP-C    08/16/2021, 1:58 PM [facility] [place] [address] Office [phone] Fax [phone]  Notice: This dictation was prepared with Dragon dictation along with smaller phrase technology. Any transcriptional errors that result from this process are unintentional and may not be  corrected upon review.  I spent 13 minutes examining this patient, reviewing medications, and using patient centered shared decision making involving her cardiac care.  Prior to her visit I spent greater than 20 minutes reviewing her past medical history,  medications, and prior cardiac tests.

## 2021-08-16 ENCOUNTER — Other Ambulatory Visit: Payer: Self-pay

## 2021-08-16 ENCOUNTER — Encounter: Payer: Self-pay | Admitting: General Practice

## 2021-08-16 ENCOUNTER — Ambulatory Visit: Payer: Medicare Other | Admitting: General Practice

## 2021-08-16 VITALS — BP 136/68 | HR 67 | Ht 71.0 in | Wt 393.0 lb

## 2021-08-16 DIAGNOSIS — I10 Essential (primary) hypertension: Secondary | ICD-10-CM | POA: Diagnosis not present

## 2021-08-16 DIAGNOSIS — G4733 Obstructive sleep apnea (adult) (pediatric): Secondary | ICD-10-CM

## 2021-08-16 DIAGNOSIS — I5032 Chronic diastolic (congestive) heart failure: Secondary | ICD-10-CM

## 2021-08-16 DIAGNOSIS — E785 Hyperlipidemia, unspecified: Secondary | ICD-10-CM | POA: Diagnosis not present

## 2021-08-16 NOTE — Patient Instructions (Signed)
Medication Instructions:  The current medical regimen is effective;  continue present plan and medications as directed. Please refer to the Current Medication list given to you today.   *If you need a refill on your cardiac medications before your next appointment, please call your pharmacy*  Lab Work:   Testing/Procedures:  NONE    NONE  Special Instructions PLEASE START PHYSICAL THERAPY EXERCISES. START DAILY FOR 1 WEEK, THEN EVERY OTHER DAY-TWICE DAILY THEN ON THE OTHER DAYS ONCE DAILY  PLEASE DO ATTACHED HAMSTRING STRETCHES THREE TIMES DAILY FOR 15 SECONDS.  CONTINUE LOW SODIUM DIET  CONTINUE WEIGHT LOSS.  Follow-Up: Your next appointment:  2-3 month(s) In Person with Donato Heinz, MD   At [facility], you and your health needs are our priority.  As part of our continuing mission to provide you with exceptional heart care, we have created designated Provider Care Teams.  These Care Teams include your primary Cardiologist (physician) and Advanced Practice Providers (APPs -  Physician Assistants and Nurse Practitioners) who all work together to provide you with the care you need, when you need it.          Lying hamstring stretch   Lie flat on either the ground or a mat with the legs fully stretched out.

## 2021-08-22 ENCOUNTER — Telehealth: Payer: Self-pay | Admitting: Pulmonary Disease

## 2021-08-23 NOTE — Telephone Encounter (Signed)
LMTCB   We will need records from hospital stay. Patient may have to repeat sleep study.

## 2021-08-24 NOTE — Telephone Encounter (Signed)
Called and spoke with Aaron Hale per DPR to let her know that we are going to need the patient to sign release of information forms so that we can get the records from the hospital and rehab center so that we can get them to [employer] and insurance company so that we can hopefully help with getting them to pay for pt Bipap. She requested that the forms be mailed to patient and they will get them back to the office. Forms have been placed in the mail. Will have to wait to get records and go from there.

## 2021-08-31 DIAGNOSIS — M6281 Muscle weakness (generalized): Secondary | ICD-10-CM | POA: Diagnosis not present

## 2021-08-31 DIAGNOSIS — R2689 Other abnormalities of gait and mobility: Secondary | ICD-10-CM | POA: Diagnosis not present

## 2021-08-31 DIAGNOSIS — I5030 Unspecified diastolic (congestive) heart failure: Secondary | ICD-10-CM | POA: Diagnosis not present

## 2021-08-31 DIAGNOSIS — N179 Acute kidney failure, unspecified: Secondary | ICD-10-CM | POA: Diagnosis not present

## 2021-09-11 DIAGNOSIS — J9611 Chronic respiratory failure with hypoxia: Secondary | ICD-10-CM | POA: Diagnosis not present

## 2021-09-11 DIAGNOSIS — I509 Heart failure, unspecified: Secondary | ICD-10-CM | POA: Diagnosis not present

## 2021-09-14 NOTE — Telephone Encounter (Signed)
Received completed forms in the mail, faxed one form to [place]: [phone] and one form to [facility]: [phone]. Forms placed in scan.

## 2021-09-22 DIAGNOSIS — G4733 Obstructive sleep apnea (adult) (pediatric): Secondary | ICD-10-CM | POA: Diagnosis not present

## 2021-09-27 ENCOUNTER — Telehealth: Payer: Self-pay | Admitting: Cardiology

## 2021-09-27 MED ORDER — CARVEDILOL 12.5 MG PO TABS: 12.5000 mg | ORAL_TABLET | Freq: Two times a day (BID) | ORAL | 1 refills | Status: DC

## 2021-09-27 NOTE — Telephone Encounter (Signed)
Refills has been sent to the pharmacy. 

## 2021-09-27 NOTE — Telephone Encounter (Signed)
° ° °*  STAT* If patient is at the pharmacy, call can be transferred to refill team.   1. Which medications need to be refilled? (please list name of each medication and dose if known) carvedilol (COREG) 12.5 MG tablet  2. Which pharmacy/location (including street and city if local pharmacy) is medication to be sent to? [place] (SE), [place] [address]  3. Do they need a 30 day or 90 day supply? 90 days

## 2021-10-01 DIAGNOSIS — R2689 Other abnormalities of gait and mobility: Secondary | ICD-10-CM | POA: Diagnosis not present

## 2021-10-01 DIAGNOSIS — N179 Acute kidney failure, unspecified: Secondary | ICD-10-CM | POA: Diagnosis not present

## 2021-10-01 DIAGNOSIS — M6281 Muscle weakness (generalized): Secondary | ICD-10-CM | POA: Diagnosis not present

## 2021-10-01 DIAGNOSIS — I5030 Unspecified diastolic (congestive) heart failure: Secondary | ICD-10-CM | POA: Diagnosis not present

## 2021-10-12 DIAGNOSIS — I509 Heart failure, unspecified: Secondary | ICD-10-CM | POA: Diagnosis not present

## 2021-10-12 DIAGNOSIS — J9611 Chronic respiratory failure with hypoxia: Secondary | ICD-10-CM | POA: Diagnosis not present

## 2021-11-01 DIAGNOSIS — N179 Acute kidney failure, unspecified: Secondary | ICD-10-CM | POA: Diagnosis not present

## 2021-11-01 DIAGNOSIS — M6281 Muscle weakness (generalized): Secondary | ICD-10-CM | POA: Diagnosis not present

## 2021-11-01 DIAGNOSIS — I5030 Unspecified diastolic (congestive) heart failure: Secondary | ICD-10-CM | POA: Diagnosis not present

## 2021-11-01 DIAGNOSIS — R2689 Other abnormalities of gait and mobility: Secondary | ICD-10-CM | POA: Diagnosis not present

## 2021-11-09 DIAGNOSIS — J9611 Chronic respiratory failure with hypoxia: Secondary | ICD-10-CM | POA: Diagnosis not present

## 2021-11-09 DIAGNOSIS — I509 Heart failure, unspecified: Secondary | ICD-10-CM | POA: Diagnosis not present

## 2021-11-24 DIAGNOSIS — G4733 Obstructive sleep apnea (adult) (pediatric): Secondary | ICD-10-CM | POA: Diagnosis not present

## 2021-11-29 DIAGNOSIS — R2689 Other abnormalities of gait and mobility: Secondary | ICD-10-CM | POA: Diagnosis not present

## 2021-11-29 DIAGNOSIS — M6281 Muscle weakness (generalized): Secondary | ICD-10-CM | POA: Diagnosis not present

## 2021-11-29 DIAGNOSIS — I5030 Unspecified diastolic (congestive) heart failure: Secondary | ICD-10-CM | POA: Diagnosis not present

## 2021-11-29 DIAGNOSIS — N179 Acute kidney failure, unspecified: Secondary | ICD-10-CM | POA: Diagnosis not present

## 2021-12-05 ENCOUNTER — Ambulatory Visit (INDEPENDENT_AMBULATORY_CARE_PROVIDER_SITE_OTHER): Payer: Medicare Other | Admitting: Cardiology

## 2021-12-05 VITALS — BP 126/80 | HR 89 | Ht 71.0 in | Wt >= 6400 oz

## 2021-12-05 DIAGNOSIS — I10 Essential (primary) hypertension: Secondary | ICD-10-CM | POA: Diagnosis not present

## 2021-12-05 DIAGNOSIS — N183 Chronic kidney disease, stage 3 unspecified: Secondary | ICD-10-CM | POA: Diagnosis not present

## 2021-12-05 DIAGNOSIS — Z79899 Other long term (current) drug therapy: Secondary | ICD-10-CM | POA: Diagnosis not present

## 2021-12-05 DIAGNOSIS — I5032 Chronic diastolic (congestive) heart failure: Secondary | ICD-10-CM | POA: Diagnosis not present

## 2021-12-05 DIAGNOSIS — E785 Hyperlipidemia, unspecified: Secondary | ICD-10-CM | POA: Diagnosis not present

## 2021-12-05 MED ORDER — OMEPRAZOLE 20 MG PO CPDR: 20.0000 mg | DELAYED_RELEASE_CAPSULE | Freq: Every day | ORAL | 11 refills | Status: AC

## 2021-12-05 NOTE — Progress Notes (Signed)
?Cardiology Office Note:   ? ?Date:  12/06/2021  ? ?ID:  Aaron Hale, DOB 04/29/1961, MRN [id number] ? ?PCP:  Aretta Nip, MD  ?Cardiologist:  Donato Heinz, MD  ?Electrophysiologist:  None  ? ?Referring MD: Aretta Nip, MD  ? ?Chief Complaint  ?Patient presents with  ? Follow-up  ? Congestive Heart Failure  ? ? ?History of Present Illness:   ? ?Aaron Hale is a 61 y.o. male with a hx of HFpEF, hypertension, obesity, CKD stage IV who presents for follow-up.  TTE 05/04/2019 showed normal LV systolic function, moderate LVH, grade 1 diastolic dysfunction, normal RV function, no significant valvular disease.  He had been started on torsemide and lost about 60 pounds over the prior 6 months.  ? ?At initial clinic visit on 11/18/2019, spironolactone was discontinued given his CKD stage IV.  BMP checked that day showed potassium 6.0.  His losartan was also discontinued.  Repeat BMP on 11/21/2019 showed improvement to potassium 5.5.  Subsequently had follow-up with nephrology, with labs on 3/25 showing improvement in creatinine to 2.1 and potassium 3.9. ? ?He was admitted in May 2022 after being found hypoxic at his nephrology clinic visit.  He was diuresed with IV Lasix.  Echocardiogram 01/06/2021 showed EF 55 to 60%, RV poorly visualized, no valvular disease seen but poor study.  [place] 01/11/2021 showed RA 9, RV 42/13, PA 38/10/28, PW 13, PA sat 64%.  He was discharged on p.o. Lasix 40 mg twice daily.  Creatinine 1.8 at time of discharge, discharge weight was 179 kg. ? ?Since last clinic visit, he reports has been doing okay.  Reports he was vacationing in September in [place] and found to have staph infection.  Was admitted with bacteremia at [facility] for about 1 month.  Has been in wheelchair since that time.  Reports intermittent shortness of breath.  Denies any chest pain, headedness, syncope, palpitations.  Reports chronic lower extremity edema. ? ? ?Wt Readings from Last 3  Encounters:  ?12/05/21 (!) 404 lb (183.3 kg)  ?08/16/21 (!) 393 lb (178.3 kg)  ?02/22/21 (!) 389 lb (176.4 kg)  ? ? ? ?Past Medical History:  ?Diagnosis Date  ? CHF (congestive heart failure) ([place])   ? Chronic kidney disease   ? Hypertension   ? Morbid obesity ([place])   ? Shortness of breath 03/30/2020  ? Sleep apnea   ? Bipap  ? ? ?Past Surgical History:  ?Procedure Laterality Date  ? RIGHT HEART CATH N/A 01/11/2021  ? Procedure: RIGHT HEART CATH;  Surgeon: Jolaine Artist, MD;  Location: [place] [facility];  Service: Cardiovascular;  Laterality: N/A;  ? ? ?Current Medications: ?Current Meds  ?Medication Sig  ? carvedilol (COREG) 12.5 MG tablet Take 1 tablet (12.5 mg total) by mouth 2 (two) times daily with a meal.  ? omeprazole (PRILOSEC) 20 MG capsule Take 1 capsule (20 mg total) by mouth daily.  ? OXYGEN Inhale 2 L/min into the lungs as needed (for shortness of breath).  ? rosuvastatin (CRESTOR) 10 MG tablet Take 1 tablet by mouth once daily  ? [DISCONTINUED] aspirin 81 MG chewable tablet Chew 81 mg by mouth daily.  ? [DISCONTINUED] ferrous sulfate 325 (65 FE) MG tablet Take 325 mg by mouth 2 (two) times daily with a meal.  ?  ? ?Allergies:   Spironolactone  ? ?Social History  ? ?Socioeconomic History  ? Marital status: Single  ?  Spouse name: Not on file  ?  Number of children: Not on file  ? Years of education: Not on file  ? Highest education level: Not on file  ?Occupational History  ? Occupation: retired  ?Tobacco Use  ? Smoking status: Never  ? Smokeless tobacco: Never  ?Vaping Use  ? Vaping Use: Never used  ?Substance and Sexual Activity  ? Alcohol use: Not Currently  ? Drug use: No  ? Sexual activity: Not Currently  ?Other Topics Concern  ? Not on file  ?Social History Narrative  ? Not on file  ? ?Social Determinants of Health  ? ?Financial Resource Strain: Low Risk   ? Difficulty of Paying Living Expenses: Not very hard  ?Food Insecurity: No Food Insecurity  ? Worried About Charity fundraiser in the  Last Year: Never true  ? Ran Out of Food in the Last Year: Never true  ?Transportation Needs: No Transportation Needs  ? Lack of Transportation (Medical): No  ? Lack of Transportation (Non-Medical): No  ?Physical Activity: Not on file  ?Stress: Not on file  ?Social Connections: Not on file  ?  ? ?Family History: ?The patient's family history includes CAD in his mother. ? ?ROS:   ?Please see the history of present illness.    ? All other systems reviewed and are negative. ? ?EKGs/Labs/Other Studies Reviewed:   ? ?The following studies were reviewed today: ? ? ?EKG:   ?12/05/2021: Sinus rhythm with PACs, first-degree AV block, left bundle branch block, rate 79 ? ?TTE 05/04/19: ? 1. The left ventricle has normal systolic function with an ejection  ?fraction of 60-65%. The cavity size was normal. There is moderately  ?increased left ventricular wall thickness. Left ventricular diastolic  ?Doppler parameters are consistent with impaired  ? relaxation. No evidence of left ventricular regional wall motion  ?abnormalities.  ? 2. The right ventricle has normal systolic function. The cavity was  ?normal. There is no increase in right ventricular wall thickness.  ? 3. The mitral valve is grossly normal.  ? 4. The tricuspid valve is grossly normal.  ? 5. The aortic valve is tricuspid. Aortic valve regurgitation was not  ?assessed by color flow Doppler.  ? 6. The aorta is normal unless otherwise noted.  ? ?Recent Labs: ?01/05/2021: ALT 16 ?01/06/2021: Platelets 175 ?01/11/2021: Hemoglobin 13.6; Hemoglobin 14.3 ?01/20/2021: B Natriuretic Peptide 11.9 ?12/05/2021: BUN 34; Creatinine, Ser 1.78; Magnesium 2.3; Potassium 3.9; Sodium 141  ?Recent Lipid Panel ?   ?Component Value Date/Time  ? CHOL 130 01/08/2021 0148  ? TRIG 111 01/08/2021 0148  ? HDL 38 (L) 01/08/2021 0148  ? CHOLHDL 3.4 01/08/2021 0148  ? VLDL 22 01/08/2021 0148  ? [place] 70 01/08/2021 0148  ? ? ?Physical Exam:   ? ?VS:  BP 126/80 (BP Location: Left Arm, Patient Position:  Sitting, Cuff Size: Large)   Pulse 89   Ht 5\' 11"  (1.803 m)   Wt (!) 404 lb (183.3 kg)   BMI 56.35 kg/m?    ? ?Wt Readings from Last 3 Encounters:  ?12/05/21 (!) 404 lb (183.3 kg)  ?08/16/21 (!) 393 lb (178.3 kg)  ?02/22/21 (!) 389 lb (176.4 kg)  ?  ? ?GEN: in no acute distress ?HEENT: Normal ?NECK: No JVD appreciated, but difficult to assess given body habitus ?LYMPHATICS: No lymphadenopathy ?CARDIAC: RRR, no murmurs, rubs, gallops ?RESPIRATORY: CTAB ?ABDOMEN: Soft, non-tender, non-distended ?MUSCULOSKELETAL:  trace edema; No deformity  ?SKIN: Warm and dry ?NEUROLOGIC:  Alert and oriented x 3 ?PSYCHIATRIC:  Normal affect  ? ?ASSESSMENT:   ? ?  1. Chronic diastolic (congestive) heart failure ([place])   ?2. Essential hypertension   ?3. Medication management   ?4. Stage 3 chronic kidney disease, unspecified whether stage 3a or 3b CKD ([place])   ?5. Hyperlipidemia, unspecified hyperlipidemia type   ? ?  ?PLAN:   ? ?Chronic respiratory insufficiency: had been on 2L O2 chronically.  Has been attributed to HFpEF, which may be contributing but had only mild diastolic dysfunction on TTE.  Concern for underlying pulmonary disease.  Seen by pulmonology: CT chest unremarkable, PFTs unremarkable.  Suspect OHS/OSA.  Underwent sleep study, showed severe OSA ?-Continue Lasix 40 mg twice daily.  Appears euvolemic.  Check BMET, magnesium ? ?Bacteremia: Reports had admission in September 2022 with bacteremia in [place].  Will obtain records ? ?Hypertension: On carvedilol 12.5 mg twice daily, Lasix 40 mg twice daily.  Appears controlled ? ?CKD stage III: Follows with Dr Hollie Salk in nephrology.  Most recent labs showed improvement in creatinine to 1.6 in 01/2021, down from peak of 3.2 on 11/03/2019.  Check BMET ? ?Hyperlipidemia: On rosuvastatin 40 mg daily, LDL 31.  Rosuvastatin dose decreased to 10 mg daily ? ?OSA: Has severe OSA, started on BiPAP ? ? ?RTC in 6 months ? ? ?Medication Adjustments/Labs and Tests Ordered: ?Current medicines  are reviewed at length with the patient today.  Concerns regarding medicines are outlined above.  ?Orders Placed This Encounter  ?Procedures  ? Basic metabolic panel  ? Magnesium  ? EKG 12-Lead  ? ?Meds ordered this

## 2021-12-05 NOTE — Patient Instructions (Signed)
Medication Instructions:  ?START: OMEPRAZOLE 20mg  ONCE DAILY  ?*If you need a refill on your cardiac medications before your next appointment, please call your pharmacy* ? ?Lab Work: ?BMET/MAG- TODAY  ?If you have labs (blood work) drawn today and your tests are completely normal, you will receive your results only by: ?MyChart Message (if you have MyChart) OR ?A paper copy in the mail ?If you have any lab test that is abnormal or we need to change your treatment, we will call you to review the results. ? ?Follow-Up: ?At [facility], you and your health needs are our priority.  As part of our continuing mission to provide you with exceptional heart care, we have created designated Provider Care Teams.  These Care Teams include your primary Cardiologist (physician) and Advanced Practice Providers (APPs -  Physician Assistants and Nurse Practitioners) who all work together to provide you with the care you need, when you need it. ? ?Your next appointment:   ?6 month(s) ? ?The format for your next appointment:   ?In Person ? ?Provider:   ?Donato Heinz, MD   ?

## 2021-12-06 LAB — BASIC METABOLIC PANEL
BUN/Creatinine Ratio: 19 (ref 10–24)
BUN: 34 mg/dL — ABNORMAL HIGH (ref 8–27)
CO2: 31 mmol/L — ABNORMAL HIGH (ref 20–29)
Calcium: 9.5 mg/dL (ref 8.6–10.2)
Chloride: 92 mmol/L — ABNORMAL LOW (ref 96–106)
Creatinine, Ser: 1.78 mg/dL — ABNORMAL HIGH (ref 0.76–1.27)
Glucose: 99 mg/dL (ref 70–99)
Potassium: 3.9 mmol/L (ref 3.5–5.2)
Sodium: 141 mmol/L (ref 134–144)
eGFR: 43 mL/min/{1.73_m2} — ABNORMAL LOW (ref >59)

## 2021-12-06 LAB — MAGNESIUM: Magnesium: 2.3 mg/dL (ref 1.6–2.3)

## 2021-12-10 DIAGNOSIS — I509 Heart failure, unspecified: Secondary | ICD-10-CM | POA: Diagnosis not present

## 2021-12-10 DIAGNOSIS — J9611 Chronic respiratory failure with hypoxia: Secondary | ICD-10-CM | POA: Diagnosis not present

## 2021-12-25 DIAGNOSIS — G4733 Obstructive sleep apnea (adult) (pediatric): Secondary | ICD-10-CM | POA: Diagnosis not present

## 2021-12-30 DIAGNOSIS — M6281 Muscle weakness (generalized): Secondary | ICD-10-CM | POA: Diagnosis not present

## 2021-12-30 DIAGNOSIS — N179 Acute kidney failure, unspecified: Secondary | ICD-10-CM | POA: Diagnosis not present

## 2021-12-30 DIAGNOSIS — I5030 Unspecified diastolic (congestive) heart failure: Secondary | ICD-10-CM | POA: Diagnosis not present

## 2021-12-30 DIAGNOSIS — R2689 Other abnormalities of gait and mobility: Secondary | ICD-10-CM | POA: Diagnosis not present

## 2022-01-09 DIAGNOSIS — J9611 Chronic respiratory failure with hypoxia: Secondary | ICD-10-CM | POA: Diagnosis not present

## 2022-01-09 DIAGNOSIS — I509 Heart failure, unspecified: Secondary | ICD-10-CM | POA: Diagnosis not present

## 2022-01-11 DIAGNOSIS — G4733 Obstructive sleep apnea (adult) (pediatric): Secondary | ICD-10-CM | POA: Diagnosis not present

## 2022-01-24 DIAGNOSIS — G4733 Obstructive sleep apnea (adult) (pediatric): Secondary | ICD-10-CM | POA: Diagnosis not present

## 2022-01-27 DIAGNOSIS — I5030 Unspecified diastolic (congestive) heart failure: Secondary | ICD-10-CM | POA: Diagnosis not present

## 2022-01-27 DIAGNOSIS — Z9981 Dependence on supplemental oxygen: Secondary | ICD-10-CM | POA: Diagnosis not present

## 2022-01-27 DIAGNOSIS — G4730 Sleep apnea, unspecified: Secondary | ICD-10-CM | POA: Diagnosis not present

## 2022-01-27 DIAGNOSIS — I129 Hypertensive chronic kidney disease with stage 1 through stage 4 chronic kidney disease, or unspecified chronic kidney disease: Secondary | ICD-10-CM | POA: Diagnosis not present

## 2022-01-27 DIAGNOSIS — N184 Chronic kidney disease, stage 4 (severe): Secondary | ICD-10-CM | POA: Diagnosis not present

## 2022-01-29 DIAGNOSIS — N179 Acute kidney failure, unspecified: Secondary | ICD-10-CM | POA: Diagnosis not present

## 2022-01-29 DIAGNOSIS — R2689 Other abnormalities of gait and mobility: Secondary | ICD-10-CM | POA: Diagnosis not present

## 2022-01-29 DIAGNOSIS — M6281 Muscle weakness (generalized): Secondary | ICD-10-CM | POA: Diagnosis not present

## 2022-01-29 DIAGNOSIS — I5030 Unspecified diastolic (congestive) heart failure: Secondary | ICD-10-CM | POA: Diagnosis not present

## 2022-02-09 DIAGNOSIS — I509 Heart failure, unspecified: Secondary | ICD-10-CM | POA: Diagnosis not present

## 2022-02-09 DIAGNOSIS — J9611 Chronic respiratory failure with hypoxia: Secondary | ICD-10-CM | POA: Diagnosis not present

## 2022-02-24 DIAGNOSIS — G4733 Obstructive sleep apnea (adult) (pediatric): Secondary | ICD-10-CM | POA: Diagnosis not present

## 2022-03-01 DIAGNOSIS — N179 Acute kidney failure, unspecified: Secondary | ICD-10-CM | POA: Diagnosis not present

## 2022-03-01 DIAGNOSIS — R2689 Other abnormalities of gait and mobility: Secondary | ICD-10-CM | POA: Diagnosis not present

## 2022-03-01 DIAGNOSIS — M6281 Muscle weakness (generalized): Secondary | ICD-10-CM | POA: Diagnosis not present

## 2022-03-01 DIAGNOSIS — I5030 Unspecified diastolic (congestive) heart failure: Secondary | ICD-10-CM | POA: Diagnosis not present

## 2022-03-11 DIAGNOSIS — J9611 Chronic respiratory failure with hypoxia: Secondary | ICD-10-CM | POA: Diagnosis not present

## 2022-03-11 DIAGNOSIS — I509 Heart failure, unspecified: Secondary | ICD-10-CM | POA: Diagnosis not present

## 2022-03-24 ENCOUNTER — Other Ambulatory Visit: Payer: Self-pay | Admitting: Cardiology

## 2022-03-26 DIAGNOSIS — G4733 Obstructive sleep apnea (adult) (pediatric): Secondary | ICD-10-CM | POA: Diagnosis not present

## 2022-03-31 DIAGNOSIS — M6281 Muscle weakness (generalized): Secondary | ICD-10-CM | POA: Diagnosis not present

## 2022-03-31 DIAGNOSIS — N179 Acute kidney failure, unspecified: Secondary | ICD-10-CM | POA: Diagnosis not present

## 2022-03-31 DIAGNOSIS — R2689 Other abnormalities of gait and mobility: Secondary | ICD-10-CM | POA: Diagnosis not present

## 2022-03-31 DIAGNOSIS — I5030 Unspecified diastolic (congestive) heart failure: Secondary | ICD-10-CM | POA: Diagnosis not present

## 2022-04-11 DIAGNOSIS — I509 Heart failure, unspecified: Secondary | ICD-10-CM | POA: Diagnosis not present

## 2022-04-11 DIAGNOSIS — J9611 Chronic respiratory failure with hypoxia: Secondary | ICD-10-CM | POA: Diagnosis not present

## 2022-04-17 ENCOUNTER — Telehealth: Payer: Self-pay | Admitting: Cardiology

## 2022-04-17 MED ORDER — CARVEDILOL 12.5 MG PO TABS: 12.5000 mg | ORAL_TABLET | Freq: Two times a day (BID) | ORAL | 2 refills | Status: DC

## 2022-04-17 NOTE — Telephone Encounter (Signed)
*  STAT* If patient is at the pharmacy, call can be transferred to refill team.   1. Which medications need to be refilled? (please list name of each medication and dose if known)  carvedilol (COREG) 12.5 MG tablet  2. Which pharmacy/location (including street and city if local pharmacy) is medication to be sent to? [place] (SE), [place] [address]  3. Do they need a 30 day or 90 day supply?  90 day supply Patient is completely out of medication

## 2022-05-12 DIAGNOSIS — J9611 Chronic respiratory failure with hypoxia: Secondary | ICD-10-CM | POA: Diagnosis not present

## 2022-05-12 DIAGNOSIS — I509 Heart failure, unspecified: Secondary | ICD-10-CM | POA: Diagnosis not present

## 2022-05-15 IMAGING — CR DG CHEST 2V
2 series · 2 of 2 positions shown · non-contrast
Comparison: 05/03/2019

CLINICAL DATA: Shortness of breath

EXAM:
CHEST - 2 VIEW

[chest lat]
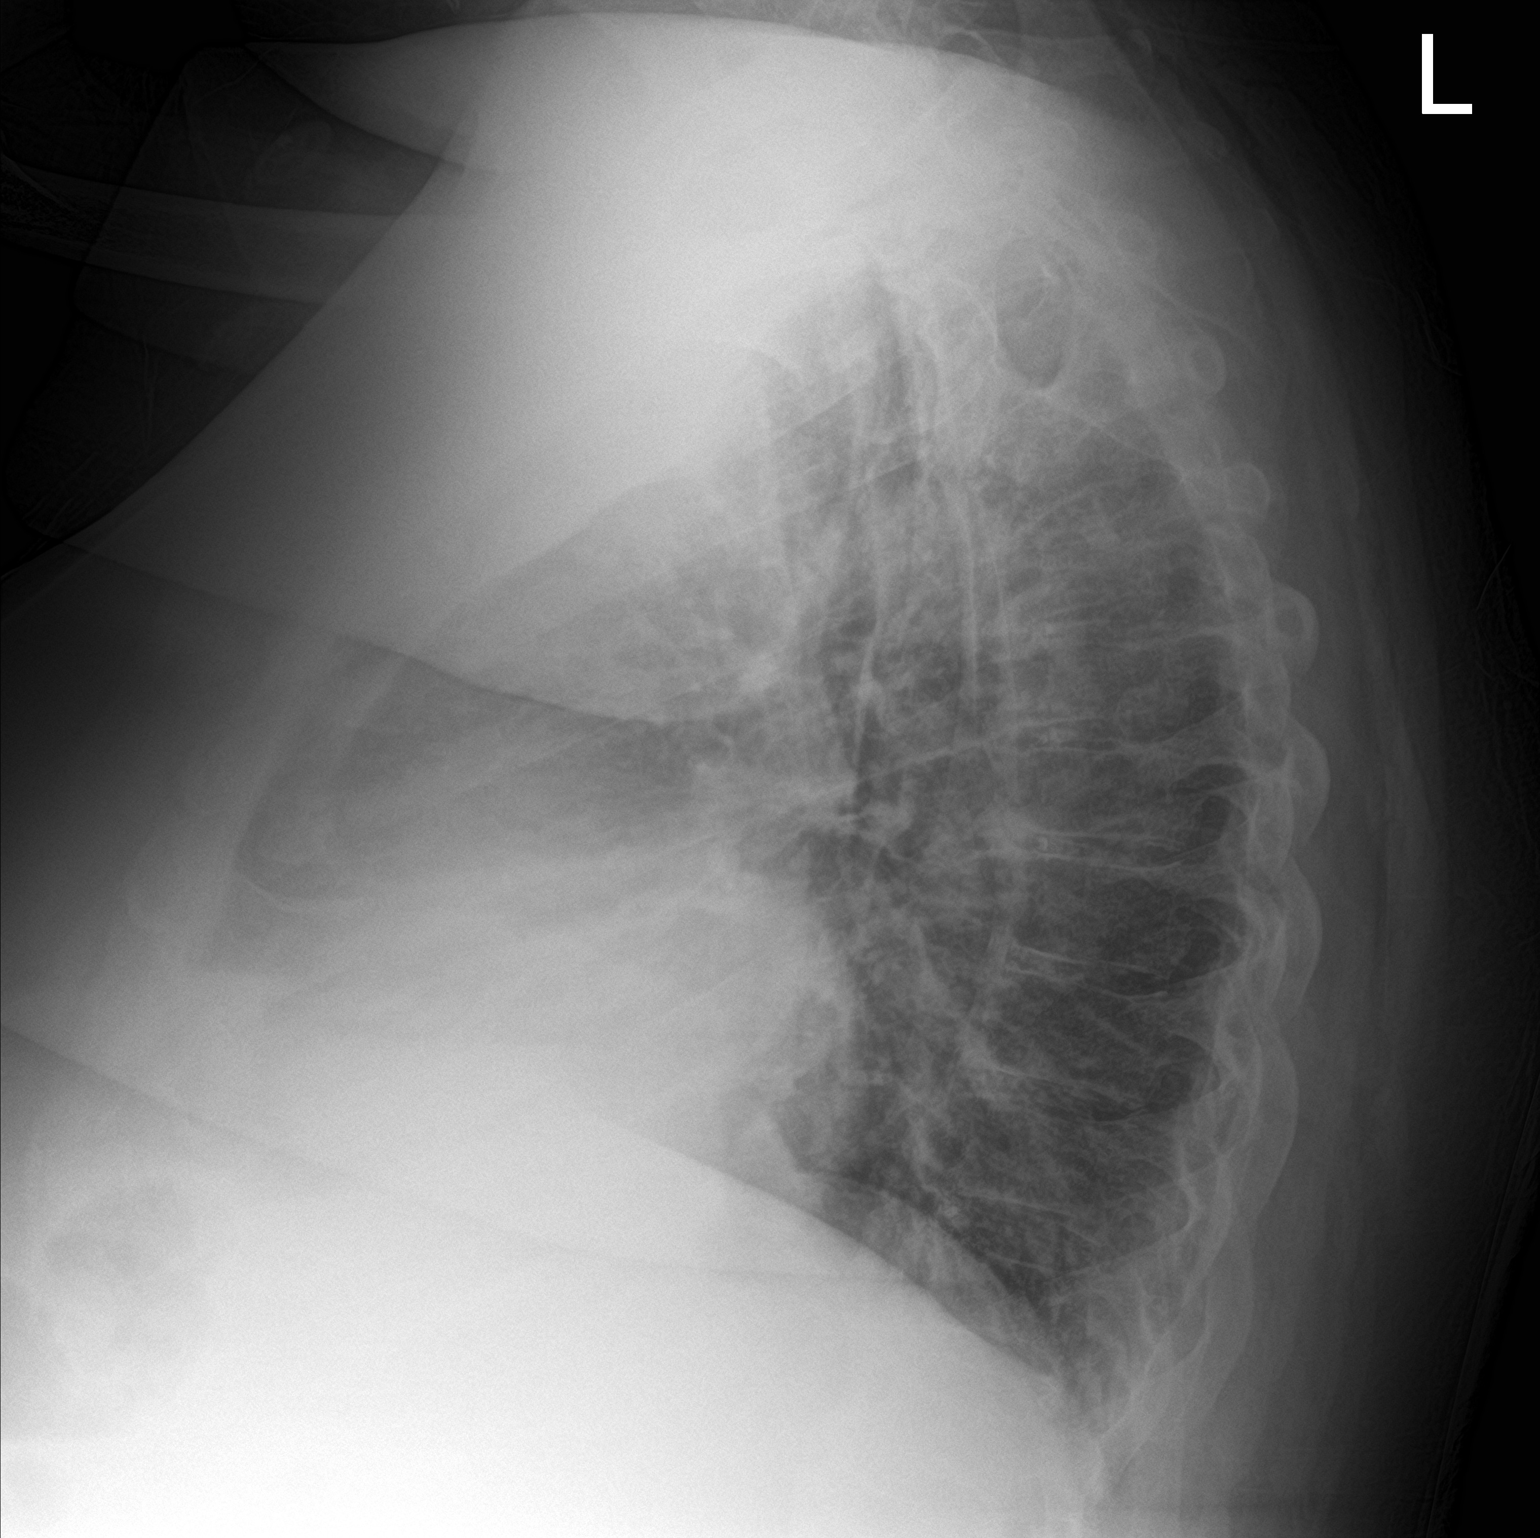

[chest ap]
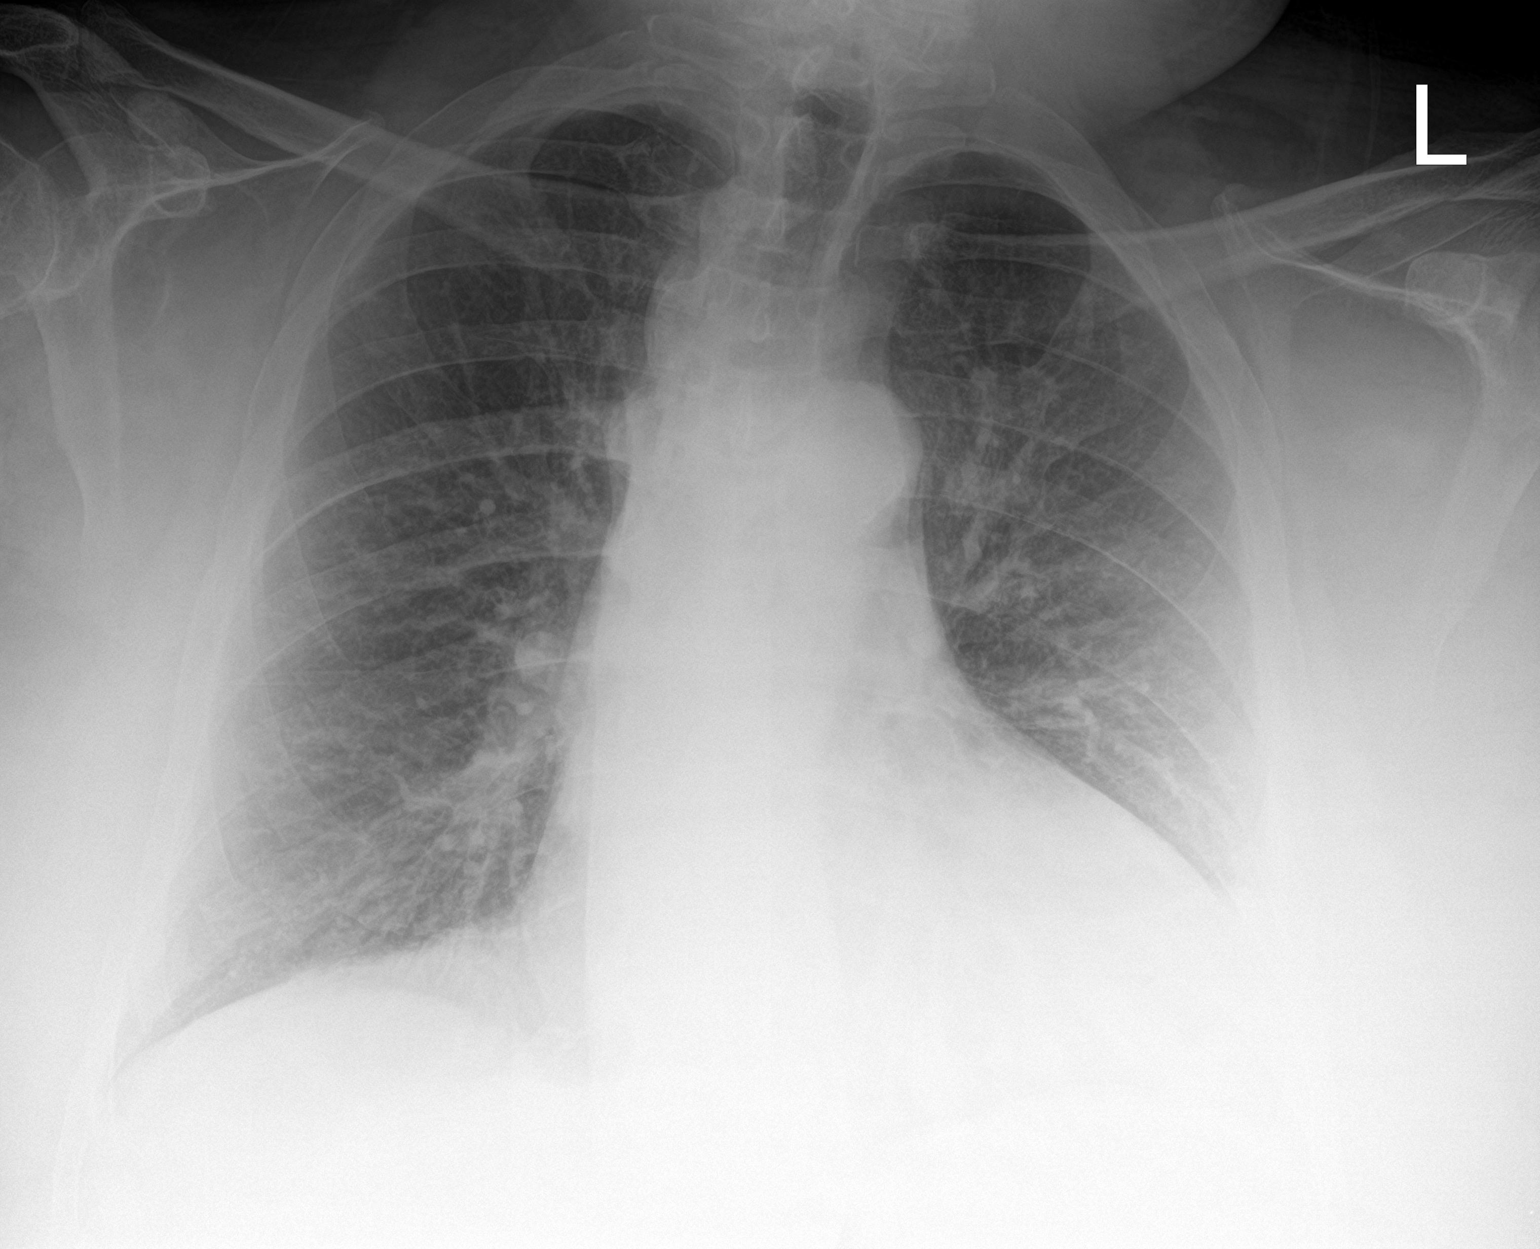

[2 of 2 positions shown; findings below may reference images not displayed]

FINDINGS: Stable cardiomegaly. Mild pulmonary vascular congestion. Increased
interstitial markings within the perihilar and bibasilar regions. No
pleural effusion or pneumothorax.
IMPRESSION: Findings suggest CHF with mild interstitial edema.

## 2022-05-17 IMAGING — DX DG CHEST 1V PORT
1 series · 1 of 1 positions shown · non-contrast
Comparison: 01/05/2021

CLINICAL DATA: Hypoxia

EXAM:
PORTABLE CHEST 1 VIEW

[chest ap]
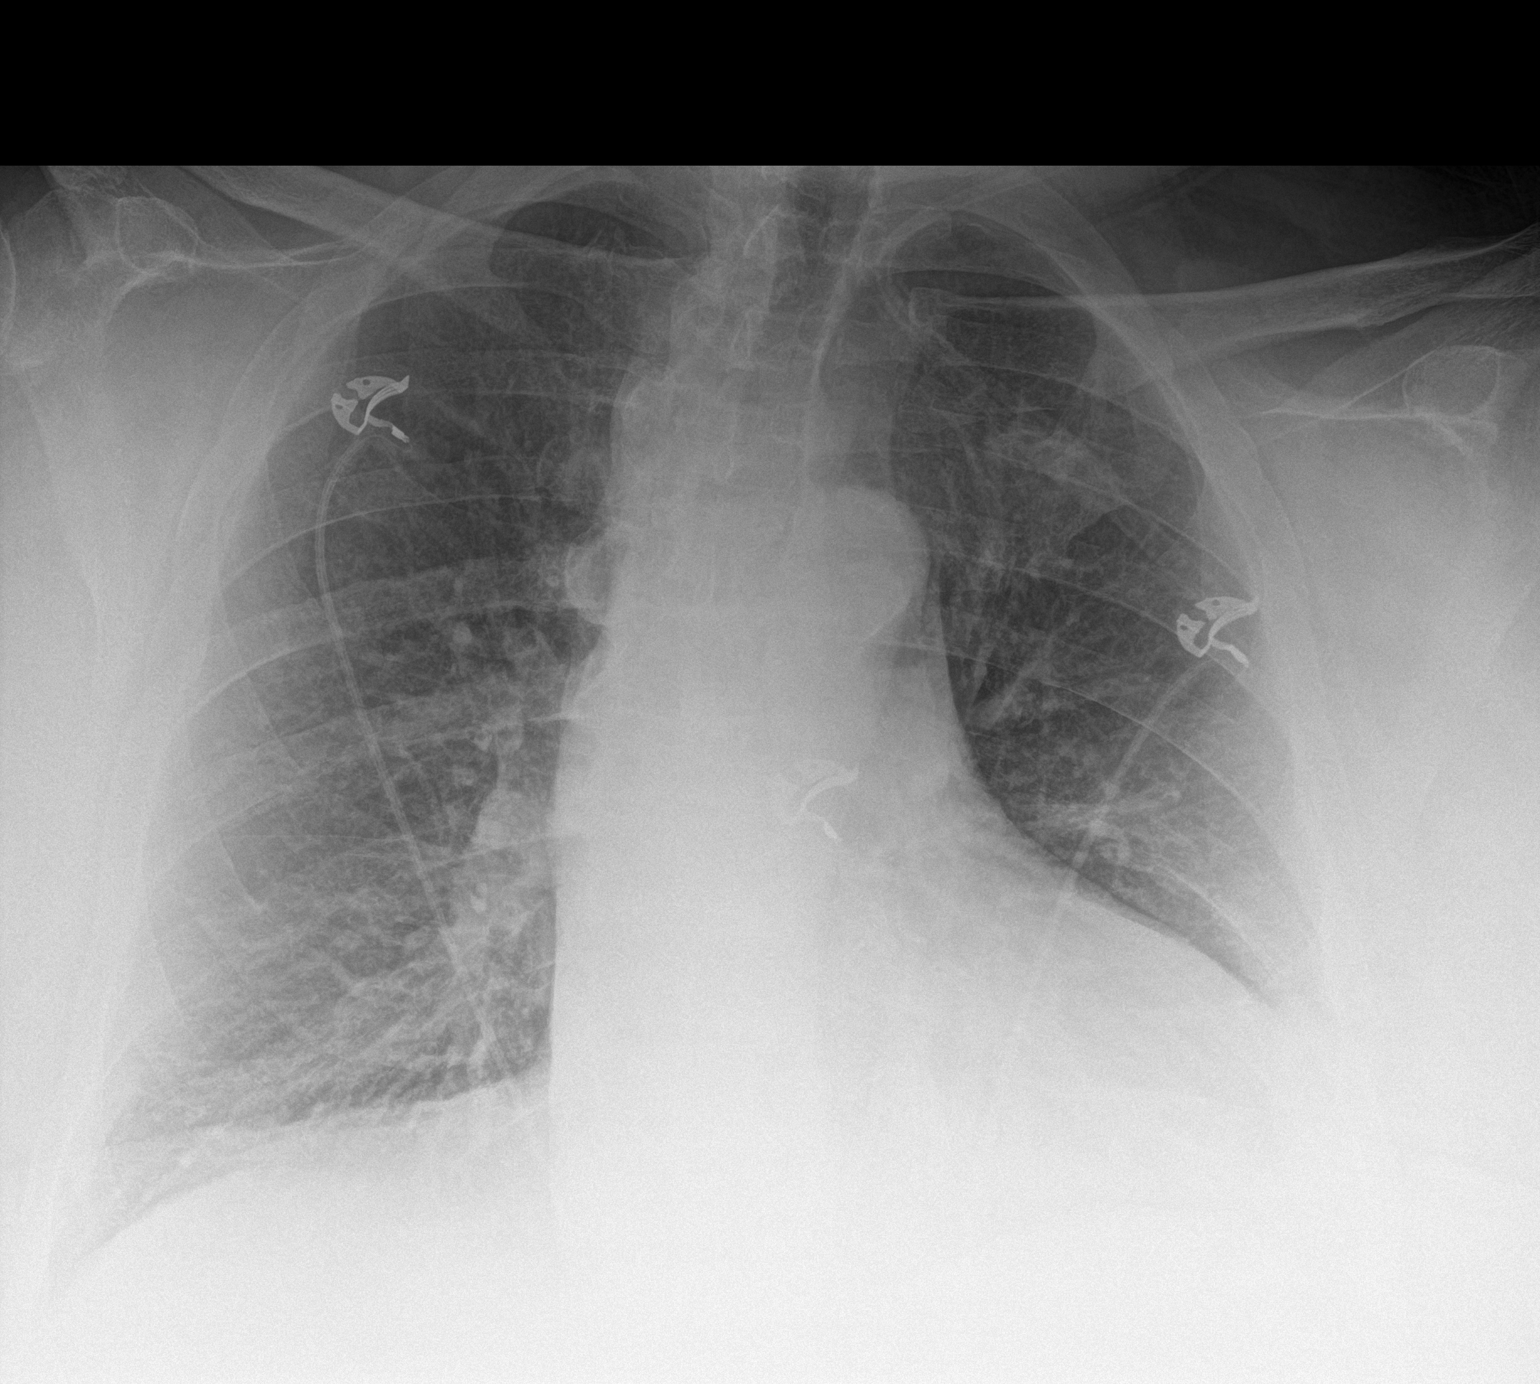

[1 of 1 positions shown; findings below may reference images not displayed]

FINDINGS: Cardiac shadow is enlarged. Aortic calcifications are again noted.
The lungs are well aerated bilaterally. Previously seen vascular
congestion and interstitial edema has improved in the interval. No
new focal infiltrate is seen. No bony abnormality is noted.
IMPRESSION: Improved CHF when compare with the prior study.

## 2022-06-11 DIAGNOSIS — J9611 Chronic respiratory failure with hypoxia: Secondary | ICD-10-CM | POA: Diagnosis not present

## 2022-06-11 DIAGNOSIS — I509 Heart failure, unspecified: Secondary | ICD-10-CM | POA: Diagnosis not present

## 2022-06-14 ENCOUNTER — Other Ambulatory Visit: Payer: Self-pay | Admitting: Cardiology

## 2022-07-01 DIAGNOSIS — R2689 Other abnormalities of gait and mobility: Secondary | ICD-10-CM | POA: Diagnosis not present

## 2022-07-01 DIAGNOSIS — I5030 Unspecified diastolic (congestive) heart failure: Secondary | ICD-10-CM | POA: Diagnosis not present

## 2022-07-01 DIAGNOSIS — N179 Acute kidney failure, unspecified: Secondary | ICD-10-CM | POA: Diagnosis not present

## 2022-07-01 DIAGNOSIS — M6281 Muscle weakness (generalized): Secondary | ICD-10-CM | POA: Diagnosis not present

## 2022-07-12 DIAGNOSIS — I509 Heart failure, unspecified: Secondary | ICD-10-CM | POA: Diagnosis not present

## 2022-07-12 DIAGNOSIS — J9611 Chronic respiratory failure with hypoxia: Secondary | ICD-10-CM | POA: Diagnosis not present

## 2022-07-12 DIAGNOSIS — G4733 Obstructive sleep apnea (adult) (pediatric): Secondary | ICD-10-CM | POA: Diagnosis not present

## 2022-07-17 ENCOUNTER — Other Ambulatory Visit: Payer: Self-pay | Admitting: Cardiology

## 2022-08-08 DIAGNOSIS — I129 Hypertensive chronic kidney disease with stage 1 through stage 4 chronic kidney disease, or unspecified chronic kidney disease: Secondary | ICD-10-CM | POA: Diagnosis not present

## 2022-08-08 DIAGNOSIS — D649 Anemia, unspecified: Secondary | ICD-10-CM | POA: Diagnosis not present

## 2022-08-08 DIAGNOSIS — G4730 Sleep apnea, unspecified: Secondary | ICD-10-CM | POA: Diagnosis not present

## 2022-08-08 DIAGNOSIS — I5030 Unspecified diastolic (congestive) heart failure: Secondary | ICD-10-CM | POA: Diagnosis not present

## 2022-08-08 DIAGNOSIS — N2581 Secondary hyperparathyroidism of renal origin: Secondary | ICD-10-CM | POA: Diagnosis not present

## 2022-08-08 DIAGNOSIS — N184 Chronic kidney disease, stage 4 (severe): Secondary | ICD-10-CM | POA: Diagnosis not present

## 2022-08-08 DIAGNOSIS — M79643 Pain in unspecified hand: Secondary | ICD-10-CM | POA: Diagnosis not present

## 2022-08-14 ENCOUNTER — Other Ambulatory Visit: Payer: Self-pay | Admitting: Cardiology

## 2022-08-16 ENCOUNTER — Telehealth: Payer: Self-pay | Admitting: Cardiology

## 2022-08-16 ENCOUNTER — Other Ambulatory Visit: Payer: Self-pay | Admitting: Cardiology

## 2022-08-16 MED ORDER — ROSUVASTATIN CALCIUM 10 MG PO TABS: 10.0000 mg | ORAL_TABLET | Freq: Every day | ORAL | 1 refills | Status: DC

## 2022-08-16 NOTE — Telephone Encounter (Signed)
Refill sent to pharmacy until [place] in February.

## 2022-08-16 NOTE — Telephone Encounter (Signed)
Pt c/o medication issue:  1. Name of Medication: rosuvastatin (CRESTOR) 10 MG tablet   2. How are you currently taking this medication (dosage and times per day)? Take 1 tablet by mouth once daily   3. Are you having a reaction (difficulty breathing--STAT)? No   4. What is your medication issue? Patient needs a new 3 refill, 90 prescription rosuvastatin (CRESTOR) 10 MG tablet sent to [place] [facility] ([facility]), [place] [address]

## 2022-10-19 DIAGNOSIS — G4733 Obstructive sleep apnea (adult) (pediatric): Secondary | ICD-10-CM | POA: Diagnosis not present

## 2022-10-26 NOTE — Progress Notes (Signed)
Cardiology Office Note:    Date:  10/27/2022   ID:  Aaron Hale, DOB Dec 13, 1960, MRN [id number]  PCP:  Aretta Nip, MD  Cardiologist:  Donato Heinz, MD  Electrophysiologist:  None   Referring MD: Aretta Nip, MD   Chief Complaint  Patient presents with   Congestive Heart Failure    History of Present Illness:    Aaron Hale is a 62 y.o. male with a hx of HFpEF, hypertension, obesity, CKD stage IV who presents for follow-up.  TTE 05/04/2019 showed normal LV systolic function, moderate LVH, grade 1 diastolic dysfunction, normal RV function, no significant valvular disease.  He had been started on torsemide and lost about 60 pounds over the prior 6 months.   At initial clinic visit on 11/18/2019, spironolactone was discontinued given his CKD stage IV.  BMP checked that day showed potassium 6.0.  His losartan was also discontinued.  Repeat BMP on 11/21/2019 showed improvement to potassium 5.5.  Subsequently had follow-up with nephrology, with labs on 3/25 showing improvement in creatinine to 2.1 and potassium 3.9.  He was admitted in May 2022 after being found hypoxic at his nephrology clinic visit.  He was diuresed with IV Lasix.  Echocardiogram 01/06/2021 showed EF 55 to 60%, RV poorly visualized, no valvular disease seen but poor study.  [place] 01/11/2021 showed RA 9, RV 42/13, PA 38/10/28, PW 13, PA sat 64%.  He was discharged on p.o. Lasix 40 mg twice daily.  Creatinine 1.8 at time of discharge, discharge weight was 179 kg.  Since last clinic visit, he reports he is doing okay.  Continues to have shortness of breath.  Denies any chest pain.  Denies any lightheadedness, or palpitations.  He is taking torsemide, reports that it is keeping fluid off.  Does report occasional lower extremity edema.  He reports compliance with CPAP.  Did not take his carvedilol today.  He has not been exercising.  Having significant pain in his hands.  He does minimal walking at this  point.    Wt Readings from Last 3 Encounters:  10/27/22 (!) 410 lb (186 kg)  12/05/21 (!) 404 lb (183.3 kg)  08/16/21 (!) 393 lb (178.3 kg)     Past Medical History:  Diagnosis Date   CHF (congestive heart failure) (HCC)    Chronic kidney disease    Hypertension    Morbid obesity ([place])    Shortness of breath 03/30/2020   Sleep apnea    Bipap    Past Surgical History:  Procedure Laterality Date   RIGHT HEART CATH N/A 01/11/2021   Procedure: RIGHT HEART CATH;  Surgeon: Jolaine Artist, MD;  Location: [facility];  Service: Cardiovascular;  Laterality: N/A;    Current Medications: Current Meds  Medication Sig   carvedilol (COREG) 12.5 MG tablet Take 1 tablet (12.5 mg total) by mouth 2 (two) times daily with a meal.   omeprazole (PRILOSEC) 20 MG capsule Take 1 capsule (20 mg total) by mouth daily.   OXYGEN Inhale 2 L/min into the lungs as needed (for shortness of breath).   rosuvastatin (CRESTOR) 10 MG tablet Take 1 tablet (10 mg total) by mouth daily.   torsemide (DEMADEX) 20 MG tablet Take 60 mg by mouth daily.     Allergies:   Spironolactone   Social History   Socioeconomic History   Marital status: Single    Spouse name: Not on file   Number of children: Not on file   Years of  education: Not on file   Highest education level: Not on file  Occupational History   Occupation: retired  Tobacco Use   Smoking status: Never   Smokeless tobacco: Never  Vaping Use   Vaping Use: Never used  Substance and Sexual Activity   Alcohol use: Not Currently   Drug use: No   Sexual activity: Not Currently  Other Topics Concern   Not on file  Social History Narrative   Not on file   Social Determinants of Health   Financial Resource Strain: Low Risk  (01/11/2021)   Overall Financial Resource Strain (CARDIA)    Difficulty of Paying Living Expenses: Not very hard  Food Insecurity: No Food Insecurity (01/11/2021)   Hunger Vital Sign    Worried About Running Out  of Food in the Last Year: Never true    [place] in the Last Year: Never true  Transportation Needs: No Transportation Needs (01/11/2021)   PRAPARE - Hydrologist (Medical): No    Lack of Transportation (Non-Medical): No  Physical Activity: Inactive (06/18/2019)   Exercise Vital Sign    Days of Exercise per Week: 0 days    Minutes of Exercise per Session: 0 min  Stress: Stress Concern Present (06/18/2019)   [place]    Feeling of Stress : To some extent  Social Connections: Unknown (06/18/2019)   Social Connection and Isolation Panel [NHANES]    Frequency of Communication with Friends and Family: Never    Frequency of Social Gatherings with Friends and Family: Never    Attends Religious Services: Never    Marine scientist or Organizations: No    Attends Music therapist: Never    Marital Status: Not on file     Family History: The patient's family history includes CAD in his mother.  ROS:   Please see the history of present illness.     All other systems reviewed and are negative.  EKGs/Labs/Other Studies Reviewed:    The following studies were reviewed today:   EKG:   12/05/2021: Sinus rhythm with PACs, first-degree AV block, left bundle branch block, rate 79 10/27/22: Sinus rhythm, first-degree AV block, left bundle branch block, rate 80  TTE 05/04/19:  1. The left ventricle has normal systolic function with an ejection  fraction of 60-65%. The cavity size was normal. There is moderately  increased left ventricular wall thickness. Left ventricular diastolic  Doppler parameters are consistent with impaired   relaxation. No evidence of left ventricular regional wall motion  abnormalities.   2. The right ventricle has normal systolic function. The cavity was  normal. There is no increase in right ventricular wall thickness.   3. The mitral valve is  grossly normal.   4. The tricuspid valve is grossly normal.   5. The aortic valve is tricuspid. Aortic valve regurgitation was not  assessed by color flow Doppler.   6. The aorta is normal unless otherwise noted.   Recent Labs: 12/05/2021: BUN 34; Creatinine, Ser 1.78; Magnesium 2.3; Potassium 3.9; Sodium 141  Recent Lipid Panel    Component Value Date/Time   CHOL 130 01/08/2021 0148   TRIG 111 01/08/2021 0148   HDL 38 (L) 01/08/2021 0148   CHOLHDL 3.4 01/08/2021 0148   VLDL 22 01/08/2021 0148   LDLCALC 70 01/08/2021 0148    Physical Exam:    VS:  BP (!) 146/65   Ht 6' (1.829  m)   Wt (!) 410 lb (186 kg)   SpO2 92%   BMI 55.61 kg/m     Wt Readings from Last 3 Encounters:  10/27/22 (!) 410 lb (186 kg)  12/05/21 (!) 404 lb (183.3 kg)  08/16/21 (!) 393 lb (178.3 kg)     GEN: in no acute distress HEENT: Normal NECK: No JVD appreciated, but difficult to assess given body habitus CARDIAC: RRR, no murmurs, rubs, gallops RESPIRATORY: CTAB ABDOMEN: Soft, non-tender, non-distended MUSCULOSKELETAL:  trace edema; No deformity  SKIN: Warm and dry NEUROLOGIC:  Alert and oriented x 3 PSYCHIATRIC:  Normal affect   ASSESSMENT:    1. Chronic diastolic (congestive) heart failure ([place])   2. Essential hypertension   3. Stage 3 chronic kidney disease, unspecified whether stage 3a or 3b CKD ([place])   4. Hyperlipidemia, unspecified hyperlipidemia type   5. Morbid obesity ([place])   6. OSA (obstructive sleep apnea)   7. Arthralgia of hand, unspecified laterality       PLAN:    Chronic respiratory insufficiency: had been on 2L O2 chronically.  Has been attributed to HFpEF, which may be contributing but had only mild diastolic dysfunction on TTE.  Concern for underlying pulmonary disease.  Seen by pulmonology: CT chest unremarkable, PFTs unremarkable.  Suspect OHS/OSA.  Underwent sleep study, showed severe OSA -Volume status difficult to assess on exam but appears relatively euvolemic.   Continue torsemide 60 mg daily.  Check CMET, BNP.  Bacteremia: Reports had admission in September 2022 with bacteremia in [place].  Will obtain records.  Check echo  Hypertension: On carvedilol 12.5 mg twice daily, torsemide 60 mg daily.  BP elevated in clinic but reports did not take his carvedilol  CKD stage III: Follows with Dr Hollie Salk in nephrology.  Most recent labs showed improvement in creatinine to 1.6 in 08/2022, down from peak of 3.2 on 11/03/2019.  Check CMET  Hyperlipidemia: On rosuvastatin 40 mg daily, LDL 31.  Rosuvastatin dose decreased to 10 mg daily.  Check lipid panel  OSA: Has severe OSA, started on BiPAP.  Reports he is having issues with his device, recommend discussing with sleep medicine  Joint pain: Reports has been having pain in multiple joints and hands.  Check uric acid.  Refer to rheumatology  Morbid obesity: Body mass index is 55.61 kg/m.  Referred to healthy weight and wellness   RTC in 6 months   Medication Adjustments/Labs and Tests Ordered: Current medicines are reviewed at length with the patient today.  Concerns regarding medicines are outlined above.  Orders Placed This Encounter  Procedures   Comprehensive metabolic panel   Brain natriuretic peptide   CBC   Uric acid   Lipid panel   TSH   Hemoglobin A1c   Ambulatory referral to [facility]   Ambulatory referral to Rheumatology   ECHOCARDIOGRAM COMPLETE   No orders of the defined types were placed in this encounter.   Patient Instructions  Medication Instructions:  Your physician recommends that you continue on your current medications as directed. Please refer to the Current Medication list given to you today.  *If you need a refill on your cardiac medications before your next appointment, please call your pharmacy*   Lab Work: CMET, CBC, BNP, uric acid, Lipid, TSH, A1C today  If you have labs (blood work) drawn today and your tests are completely normal, you will receive  your results only by: [place] (if you have MyChart) OR A paper copy in the mail  If you have any lab test that is abnormal or we need to change your treatment, we will call you to review the results.   Testing/Procedures: Your physician has requested that you have an echocardiogram. Echocardiography is a painless test that uses sound waves to create images of your heart. It provides your doctor with information about the size and shape of your heart and how well your heart's chambers and valves are working. This procedure takes approximately one hour. There are no restrictions for this procedure. Please do NOT wear cologne, perfume, aftershave, or lotions (deodorant is allowed). Please arrive 15 minutes prior to your appointment time.   Follow-Up: At [facility], you and your health needs are our priority.  As part of our continuing mission to provide you with exceptional heart care, we have created designated Provider Care Teams.  These Care Teams include your primary Cardiologist (physician) and Advanced Practice Providers (APPs -  Physician Assistants and Nurse Practitioners) who all work together to provide you with the care you need, when you need it.  We recommend signing up for the patient portal called "MyChart".  Sign up information is provided on this After Visit Summary.  MyChart is used to connect with patients for Virtual Visits (Telemedicine).  Patients are able to view lab/test results, encounter notes, upcoming appointments, etc.  Non-urgent messages can be sent to your provider as well.   To learn more about what you can do with MyChart, go to [email or web address].    Your next appointment:   6 month(s)  Provider:   Donato Heinz, MD     Other Instructions You have been referred to:  [facility] You have been referred to: Rheumatology   Please call Dr. Halford Chessman to discuss your issues with [phone]       Signed, Donato Heinz, MD  10/27/2022 11:04 AM    [place]

## 2022-10-27 ENCOUNTER — Ambulatory Visit: Payer: Medicare Other | Attending: Cardiology | Admitting: Cardiology

## 2022-10-27 ENCOUNTER — Encounter: Payer: Self-pay | Admitting: Cardiology

## 2022-10-27 VITALS — BP 146/65 | Ht 72.0 in | Wt >= 6400 oz

## 2022-10-27 DIAGNOSIS — I10 Essential (primary) hypertension: Secondary | ICD-10-CM | POA: Diagnosis not present

## 2022-10-27 DIAGNOSIS — R7303 Prediabetes: Secondary | ICD-10-CM | POA: Diagnosis not present

## 2022-10-27 DIAGNOSIS — G4733 Obstructive sleep apnea (adult) (pediatric): Secondary | ICD-10-CM

## 2022-10-27 DIAGNOSIS — I5032 Chronic diastolic (congestive) heart failure: Secondary | ICD-10-CM | POA: Diagnosis not present

## 2022-10-27 DIAGNOSIS — N183 Chronic kidney disease, stage 3 unspecified: Secondary | ICD-10-CM | POA: Diagnosis not present

## 2022-10-27 DIAGNOSIS — M25549 Pain in joints of unspecified hand: Secondary | ICD-10-CM

## 2022-10-27 DIAGNOSIS — E785 Hyperlipidemia, unspecified: Secondary | ICD-10-CM

## 2022-10-27 LAB — CBC

## 2022-10-27 MED ORDER — CARVEDILOL 12.5 MG PO TABS: 12.5000 mg | ORAL_TABLET | Freq: Two times a day (BID) | ORAL | 2 refills | Status: DC

## 2022-10-27 NOTE — Addendum Note (Signed)
Addended by: Patria Mane A on: 10/27/2022 11:36 AM   Modules accepted: Orders

## 2022-10-27 NOTE — Patient Instructions (Addendum)
Medication Instructions:  Your physician recommends that you continue on your current medications as directed. Please refer to the Current Medication list given to you today.  *If you need a refill on your cardiac medications before your next appointment, please call your pharmacy*   Lab Work: CMET, CBC, BNP, uric acid, Lipid, TSH, A1C today  If you have labs (blood work) drawn today and your tests are completely normal, you will receive your results only by: [place] (if you have MyChart) OR A paper copy in the mail If you have any lab test that is abnormal or we need to change your treatment, we will call you to review the results.   Testing/Procedures: Your physician has requested that you have an echocardiogram. Echocardiography is a painless test that uses sound waves to create images of your heart. It provides your doctor with information about the size and shape of your heart and how well your heart's chambers and valves are working. This procedure takes approximately one hour. There are no restrictions for this procedure. Please do NOT wear cologne, perfume, aftershave, or lotions (deodorant is allowed). Please arrive 15 minutes prior to your appointment time.   Follow-Up: At [facility], you and your health needs are our priority.  As part of our continuing mission to provide you with exceptional heart care, we have created designated Provider Care Teams.  These Care Teams include your primary Cardiologist (physician) and Advanced Practice Providers (APPs -  Physician Assistants and Nurse Practitioners) who all work together to provide you with the care you need, when you need it.  We recommend signing up for the patient portal called "MyChart".  Sign up information is provided on this After Visit Summary.  MyChart is used to connect with patients for Virtual Visits (Telemedicine).  Patients are able to view lab/test results, encounter notes, upcoming appointments,  etc.  Non-urgent messages can be sent to your provider as well.   To learn more about what you can do with MyChart, go to [email or web address].    Your next appointment:   6 month(s)  Provider:   Donato Heinz, MD     Other Instructions You have been referred to:  [facility] You have been referred to: Rheumatology   Please call Dr. Halford Chessman to discuss your issues with [phone]

## 2022-10-30 ENCOUNTER — Telehealth: Payer: Self-pay | Admitting: Cardiology

## 2022-10-30 LAB — CBC
Hematocrit: 41.7 % (ref 37.5–51.0)
Hemoglobin: 13.6 g/dL (ref 13.0–17.7)
MCH: 28.6 pg (ref 26.6–33.0)
MCHC: 32.6 g/dL (ref 31.5–35.7)
MCV: 88 fL (ref 79–97)
Platelets: 186 10*3/uL (ref 150–450)
RBC: 4.76 x10E6/uL (ref 4.14–5.80)
RDW: 15.3 % (ref 11.6–15.4)
WBC: 10.3 10*3/uL (ref 3.4–10.8)

## 2022-10-30 LAB — COMPREHENSIVE METABOLIC PANEL
ALT: 8 IU/L (ref 0–44)
AST: 14 IU/L (ref 0–40)
Albumin/Globulin Ratio: 1.3 (ref 1.2–2.2)
Albumin: 4.3 g/dL (ref 3.9–4.9)
Alkaline Phosphatase: 53 IU/L (ref 44–121)
BUN/Creatinine Ratio: 20 (ref 10–24)
BUN: 31 mg/dL — ABNORMAL HIGH (ref 8–27)
Bilirubin Total: 0.5 mg/dL (ref 0.0–1.2)
CO2: 28 mmol/L (ref 20–29)
Calcium: 10 mg/dL (ref 8.6–10.2)
Chloride: 99 mmol/L (ref 96–106)
Creatinine, Ser: 1.53 mg/dL — ABNORMAL HIGH (ref 0.76–1.27)
Globulin, Total: 3.4 g/dL (ref 1.5–4.5)
Glucose: 98 mg/dL (ref 70–99)
Potassium: 4.1 mmol/L (ref 3.5–5.2)
Sodium: 143 mmol/L (ref 134–144)
Total Protein: 7.7 g/dL (ref 6.0–8.5)
eGFR: 51 mL/min/{1.73_m2} — ABNORMAL LOW (ref >59)

## 2022-10-30 LAB — HEMOGLOBIN A1C
Est. average glucose Bld gHb Est-mCnc: 120 mg/dL
Hgb A1c MFr Bld: 5.8 % — ABNORMAL HIGH (ref 4.8–5.6)

## 2022-10-30 LAB — URIC ACID: Uric Acid: 11.7 mg/dL — ABNORMAL HIGH (ref 3.8–8.4)

## 2022-10-30 LAB — BRAIN NATRIURETIC PEPTIDE: BNP: 21.5 pg/mL (ref 0.0–100.0)

## 2022-10-30 LAB — LIPID PANEL
Chol/HDL Ratio: 3.2 ratio (ref 0.0–5.0)
Cholesterol, Total: 130 mg/dL (ref 100–199)
HDL: 41 mg/dL (ref >39)
LDL Chol Calc (NIH): 65 mg/dL (ref 0–99)
Triglycerides: 134 mg/dL (ref 0–149)
VLDL Cholesterol Cal: 24 mg/dL (ref 5–40)

## 2022-10-30 LAB — TSH: TSH: 1.06 u[IU]/mL (ref 0.450–4.500)

## 2022-10-30 NOTE — Telephone Encounter (Signed)
Called patient to let him know that labs had not been reviewed by Dr. Gardiner Rhyme but we will call him back once they have been reviewed with his recommendations.

## 2022-10-30 NOTE — Telephone Encounter (Signed)
Patient is calling requesting his labs results. Please advise.

## 2022-11-01 ENCOUNTER — Other Ambulatory Visit: Payer: Self-pay | Admitting: *Deleted

## 2022-11-01 DIAGNOSIS — E790 Hyperuricemia without signs of inflammatory arthritis and tophaceous disease: Secondary | ICD-10-CM

## 2022-11-01 DIAGNOSIS — Z79899 Other long term (current) drug therapy: Secondary | ICD-10-CM

## 2022-11-01 DIAGNOSIS — N183 Chronic kidney disease, stage 3 unspecified: Secondary | ICD-10-CM

## 2022-11-01 MED ORDER — ALLOPURINOL 100 MG PO TABS: 50.0000 mg | ORAL_TABLET | Freq: Every day | ORAL | 1 refills | Status: AC

## 2022-11-01 NOTE — Addendum Note (Signed)
Addended by: Fidel Levy on: 11/01/2022 10:45 AM   Modules accepted: Orders

## 2022-11-06 ENCOUNTER — Telehealth: Payer: Self-pay | Admitting: Cardiology

## 2022-11-06 NOTE — Telephone Encounter (Signed)
Returned call to patient who states that he has started having ankle pain after starting the allopurinol. Patient reports that he did take his dose of allopurinol this morning. Patient states the pain started just a few days ago. Patient would like to know if there are any other recommendations. Advised patient I would forward message to Dr. Gardiner Rhyme for him to review and advise.

## 2022-11-06 NOTE — Telephone Encounter (Signed)
Pt c/o medication issue:  1. Name of Medication: allopurinol (ZYLOPRIM) 100 MG tablet   2. How are you currently taking this medication (dosage and times per day)? As prescribed   3. Are you having a reaction (difficulty breathing--STAT)? Yes   4. What is your medication issue?  Patient states that this medication has caused his gout to flair up and would like a call back to discuss his options.

## 2022-11-07 NOTE — Telephone Encounter (Signed)
Could be having gout flare, this sometimes happens after starting allopurinol.  Recommend he be seen by his PCP or urgent care to evaluate his ankle pain and for treatment of his gout

## 2022-11-07 NOTE — Telephone Encounter (Signed)
Spoke with patient. Informed him of advice from Dr. Gardiner Rhyme. He verbalized understanding.

## 2022-11-07 NOTE — Telephone Encounter (Signed)
Left message to call back  

## 2022-11-07 NOTE — Telephone Encounter (Signed)
Pt is returning a call  

## 2022-11-27 ENCOUNTER — Telehealth (HOSPITAL_COMMUNITY): Payer: Self-pay | Admitting: Cardiology

## 2022-11-27 NOTE — Telephone Encounter (Signed)
Patient called and cancelled scheduled echocardiogram. He will call back at a later date to reschedule when he is ready. Order will be removed from the active echo wq. Thank you.

## 2022-11-28 ENCOUNTER — Ambulatory Visit (HOSPITAL_COMMUNITY): Payer: Medicare Other

## 2022-12-07 DIAGNOSIS — E785 Hyperlipidemia, unspecified: Secondary | ICD-10-CM | POA: Diagnosis not present

## 2022-12-07 DIAGNOSIS — I129 Hypertensive chronic kidney disease with stage 1 through stage 4 chronic kidney disease, or unspecified chronic kidney disease: Secondary | ICD-10-CM | POA: Diagnosis not present

## 2022-12-07 DIAGNOSIS — N2581 Secondary hyperparathyroidism of renal origin: Secondary | ICD-10-CM | POA: Diagnosis not present

## 2022-12-07 DIAGNOSIS — I5030 Unspecified diastolic (congestive) heart failure: Secondary | ICD-10-CM | POA: Diagnosis not present

## 2022-12-07 DIAGNOSIS — N184 Chronic kidney disease, stage 4 (severe): Secondary | ICD-10-CM | POA: Diagnosis not present

## 2022-12-07 DIAGNOSIS — Z9981 Dependence on supplemental oxygen: Secondary | ICD-10-CM | POA: Diagnosis not present

## 2022-12-07 DIAGNOSIS — M109 Gout, unspecified: Secondary | ICD-10-CM | POA: Diagnosis not present

## 2022-12-26 DIAGNOSIS — N184 Chronic kidney disease, stage 4 (severe): Secondary | ICD-10-CM | POA: Diagnosis not present

## 2022-12-27 DIAGNOSIS — N184 Chronic kidney disease, stage 4 (severe): Secondary | ICD-10-CM | POA: Diagnosis not present

## 2023-02-13 ENCOUNTER — Encounter: Payer: Medicare Other | Admitting: Internal Medicine

## 2023-02-14 ENCOUNTER — Other Ambulatory Visit: Payer: Self-pay | Admitting: Cardiology

## 2023-02-23 DIAGNOSIS — G4733 Obstructive sleep apnea (adult) (pediatric): Secondary | ICD-10-CM | POA: Diagnosis not present

## 2023-04-26 DIAGNOSIS — Z9981 Dependence on supplemental oxygen: Secondary | ICD-10-CM | POA: Diagnosis not present

## 2023-04-26 DIAGNOSIS — E785 Hyperlipidemia, unspecified: Secondary | ICD-10-CM | POA: Diagnosis not present

## 2023-04-26 DIAGNOSIS — G4730 Sleep apnea, unspecified: Secondary | ICD-10-CM | POA: Diagnosis not present

## 2023-04-26 DIAGNOSIS — I129 Hypertensive chronic kidney disease with stage 1 through stage 4 chronic kidney disease, or unspecified chronic kidney disease: Secondary | ICD-10-CM | POA: Diagnosis not present

## 2023-04-26 DIAGNOSIS — N2581 Secondary hyperparathyroidism of renal origin: Secondary | ICD-10-CM | POA: Diagnosis not present

## 2023-04-26 DIAGNOSIS — M109 Gout, unspecified: Secondary | ICD-10-CM | POA: Diagnosis not present

## 2023-04-26 DIAGNOSIS — I5030 Unspecified diastolic (congestive) heart failure: Secondary | ICD-10-CM | POA: Diagnosis not present

## 2023-04-26 DIAGNOSIS — N184 Chronic kidney disease, stage 4 (severe): Secondary | ICD-10-CM | POA: Diagnosis not present

## 2023-04-29 NOTE — Progress Notes (Deleted)
Cardiology Office Note:    Date:  04/29/2023   ID:  [name], DOB 1961-02-06, MRN [id number]  PCP:  Clayborn Heron, MD  Cardiologist:  Little Ishikawa, MD  Electrophysiologist:  None   Referring MD: Clayborn Heron, MD   No chief complaint on file.   History of Present Illness:    Aaron Hale is a 62 y.o. male with a hx of HFpEF, hypertension, obesity, CKD stage IV who presents for follow-up.  TTE 05/04/2019 showed normal LV systolic function, moderate LVH, grade 1 diastolic dysfunction, normal RV function, no significant valvular disease.  He had been started on torsemide and lost about 60 pounds over the prior 6 months.   At initial clinic visit on 11/18/2019, spironolactone was discontinued given his CKD stage IV.  BMP checked that day showed potassium 6.0.  His losartan was also discontinued.  Repeat BMP on 11/21/2019 showed improvement to potassium 5.5.  Subsequently had follow-up with nephrology, with labs on 3/25 showing improvement in creatinine to 2.1 and potassium 3.9.  He was admitted in May 2022 after being found hypoxic at his nephrology clinic visit.  He was diuresed with IV Lasix.  Echocardiogram 01/06/2021 showed EF 55 to 60%, RV poorly visualized, no valvular disease seen but poor study.  RHC 01/11/2021 showed RA 9, RV 42/13, PA 38/10/28, PW 13, PA sat 64%.  He was discharged on p.o. Lasix 40 mg twice daily.  Creatinine 1.8 at time of discharge, discharge weight was 179 kg.  Since last clinic visit, he reports he is doing okay.  Continues to have shortness of breath.  Denies any chest pain.  Denies any lightheadedness, or palpitations.  He is taking torsemide, reports that it is keeping fluid off.  Does report occasional lower extremity edema.  He reports compliance with CPAP.  Did not take his carvedilol today.  He has not been exercising.  Having significant pain in his hands.  He does minimal walking at this point.    Wt Readings from Last 3 Encounters:   10/27/22 (!) 410 lb (186 kg)  12/05/21 (!) 404 lb (183.3 kg)  08/16/21 (!) 393 lb (178.3 kg)     Past Medical History:  Diagnosis Date   CHF (congestive heart failure) (HCC)    Chronic kidney disease    Hypertension    Morbid obesity (HCC)    Shortness of breath 03/30/2020   Sleep apnea    Bipap    Past Surgical History:  Procedure Laterality Date   RIGHT HEART CATH N/A 01/11/2021   Procedure: RIGHT HEART CATH;  Surgeon: Dolores Patty, MD;  Location: [facility];  Service: Cardiovascular;  Laterality: N/A;    Current Medications: No outpatient medications have been marked as taking for the 05/01/23 encounter (Appointment) with Little Ishikawa, MD.     Allergies:   Spironolactone   Social History   Socioeconomic History   Marital status: Single    Spouse name: Not on file   Number of children: Not on file   Years of education: Not on file   Highest education level: Not on file  Occupational History   Occupation: retired  Tobacco Use   Smoking status: Never   Smokeless tobacco: Never  Vaping Use   Vaping status: Never Used  Substance and Sexual Activity   Alcohol use: Not Currently   Drug use: No   Sexual activity: Not Currently  Other Topics Concern   Not on file  Social History Narrative  Not on file   Social Determinants of Health   Financial Resource Strain: Low Risk  (01/11/2021)   Overall Financial Resource Strain (CARDIA)    Difficulty of Paying Living Expenses: Not very hard  Food Insecurity: No Food Insecurity (01/11/2021)   Hunger Vital Sign    Worried About Running Out of Food in the Last Year: Never true    Ran Out of Food in the Last Year: Never true  Transportation Needs: No Transportation Needs (01/11/2021)   PRAPARE - Administrator, Civil Service (Medical): No    Lack of Transportation (Non-Medical): No  Physical Activity: Inactive (06/18/2019)   Exercise Vital Sign    Days of Exercise per Week: 0 days     Minutes of Exercise per Session: 0 min  Stress: Stress Concern Present (06/18/2019)   [employer] of Occupational Health - Occupational Stress Questionnaire    Feeling of Stress : To some extent  Social Connections: Unknown (06/18/2019)   Social Connection and Isolation Panel [NHANES]    Frequency of Communication with Friends and Family: Never    Frequency of Social Gatherings with Friends and Family: Never    Attends Religious Services: Never    Database administrator or Organizations: No    Attends Engineer, structural: Never    Marital Status: Not on file     Family History: The patient's family history includes CAD in his mother.  ROS:   Please see the history of present illness.     All other systems reviewed and are negative.  EKGs/Labs/Other Studies Reviewed:    The following studies were reviewed today:   EKG:   12/05/2021: Sinus rhythm with PACs, first-degree AV block, left bundle branch block, rate 79 10/27/22: Sinus rhythm, first-degree AV block, left bundle branch block, rate 80  TTE 05/04/19:  1. The left ventricle has normal systolic function with an ejection  fraction of 60-65%. The cavity size was normal. There is moderately  increased left ventricular wall thickness. Left ventricular diastolic  Doppler parameters are consistent with impaired   relaxation. No evidence of left ventricular regional wall motion  abnormalities.   2. The right ventricle has normal systolic function. The cavity was  normal. There is no increase in right ventricular wall thickness.   3. The mitral valve is grossly normal.   4. The tricuspid valve is grossly normal.   5. The aortic valve is tricuspid. Aortic valve regurgitation was not  assessed by color flow Doppler.   6. The aorta is normal unless otherwise noted.   Recent Labs: 10/27/2022: ALT 8; BNP 21.5; BUN 31; Creatinine, Ser 1.53; Hemoglobin 13.6; Platelets 186; Potassium 4.1; Sodium 143; TSH 1.060  Recent  Lipid Panel    Component Value Date/Time   CHOL 130 10/27/2022 1023   TRIG 134 10/27/2022 1023   HDL 41 10/27/2022 1023   CHOLHDL 3.2 10/27/2022 1023   CHOLHDL 3.4 01/08/2021 0148   VLDL 22 01/08/2021 0148   LDLCALC 65 10/27/2022 1023    Physical Exam:    VS:  There were no vitals taken for this visit.    Wt Readings from Last 3 Encounters:  10/27/22 (!) 410 lb (186 kg)  12/05/21 (!) 404 lb (183.3 kg)  08/16/21 (!) 393 lb (178.3 kg)     GEN: in no acute distress HEENT: Normal NECK: No JVD appreciated, but difficult to assess given body habitus CARDIAC: RRR, no murmurs, rubs, gallops RESPIRATORY: CTAB ABDOMEN: Soft, non-tender, non-distended MUSCULOSKELETAL:  trace edema; No deformity  SKIN: Warm and dry NEUROLOGIC:  Alert and oriented x 3 PSYCHIATRIC:  Normal affect   ASSESSMENT:    No diagnosis found.     PLAN:    Chronic respiratory insufficiency: had been on 2L O2 chronically.  Has been attributed to HFpEF, which may be contributing but had only mild diastolic dysfunction on TTE.  Concern for underlying pulmonary disease.  Seen by pulmonology: CT chest unremarkable, PFTs unremarkable.  Suspect OHS/OSA.  Underwent sleep study, showed severe OSA -Volume status difficult to assess on exam but appears relatively euvolemic.  Continue torsemide 60 mg daily.  Check CMET, BNP.  Bacteremia: Reports had admission in September 2022 with bacteremia in [place].  Will obtain records.  Check echo***  Hypertension: On carvedilol 12.5 mg twice daily, torsemide 60 mg daily.  BP elevated in clinic but reports did not take his carvedilol  CKD stage III: Follows with Dr Signe Colt in nephrology.  Most recent labs showed improvement in creatinine to 1.6 in 08/2022, down from peak of 3.2 on 11/03/2019.  Check CMET  Hyperlipidemia: On rosuvastatin 40 mg daily, LDL 31.  Rosuvastatin dose decreased to 10 mg daily.  Check lipid panel  OSA: Has severe OSA, started on BiPAP.  Reports he is  having issues with his device, recommend discussing with sleep medicine***  Joint pain: Reports has been having pain in multiple joints and hands.  Check uric acid.  Refer to rheumatology***  Morbid obesity: There is no height or weight on file to calculate BMI.  Referred to healthy weight and wellness   RTC in 6 months   Medication Adjustments/Labs and Tests Ordered: Current medicines are reviewed at length with the patient today.  Concerns regarding medicines are outlined above.  No orders of the defined types were placed in this encounter.  No orders of the defined types were placed in this encounter.   There are no Patient Instructions on file for this visit.   Signed, Little Ishikawa, MD  04/29/2023 10:24 PM    [facility]

## 2023-05-01 ENCOUNTER — Ambulatory Visit: Payer: Medicare Other | Admitting: Cardiology

## 2023-06-08 ENCOUNTER — Ambulatory Visit: Payer: Medicare Other | Admitting: Cardiology

## 2023-06-13 NOTE — Progress Notes (Signed)
Cardiology Clinic Note   Patient Name: Aaron Hale Date of Encounter: 06/15/2023  Primary Care Provider:  Clayborn Heron, MD Primary Cardiologist:  Little Ishikawa, MD  Patient Profile    62 y.o. male with a hx of HFpEF, hypertension, obesity, CKD stage IV who presents for follow-up.  TTE 05/04/2019 showed normal LV systolic function, moderate LVH, grade 1 diastolic dysfunction, normal RV function, no significant valvular disease   Past Medical History    Past Medical History:  Diagnosis Date   CHF (congestive heart failure) (HCC)    Chronic kidney disease    Hypertension    Morbid obesity (HCC)    Shortness of breath 03/30/2020   Sleep apnea    Bipap   Past Surgical History:  Procedure Laterality Date   RIGHT HEART CATH N/A 01/11/2021   Procedure: RIGHT HEART CATH;  Surgeon: Dolores Patty, MD;  Location: [facility];  Service: Cardiovascular;  Laterality: N/A;    Allergies  Allergies  Allergen Reactions   Spironolactone Other (See Comments)    Reaction not recalled    History of Present Illness    Aaron Hale comes today for ongoing assessment and management of chronic diastolic heart failure.  He is oxygen dependent, and is wearing oxygen today via nasal cannula.  He also has history of OSA and has had some issues with this when he first wakes up.  He reports that he has not been is compliant with low-sodium diet as he should be, sometimes eating salted foods.  He denies any PND or orthopnea but does not lay flat in the bed uses a recliner for sleeping purposes.  He has recently been placed on Jardiance 10 mg daily by his nephrologist.  He denies any chest pain, palpitations, or dizziness.  Home Medications    Current Outpatient Medications  Medication Sig Dispense Refill   allopurinol (ZYLOPRIM) 100 MG tablet Take 0.5 tablets (50 mg total) by mouth daily. (Patient taking differently: Take 1 mg by mouth daily.) 15 tablet 1   empagliflozin  (JARDIANCE) 10 MG TABS tablet Take 10 mg by mouth.     omeprazole (PRILOSEC) 20 MG capsule Take 1 capsule (20 mg total) by mouth daily. 30 capsule 11   OXYGEN Inhale 2 L/min into the lungs as needed (for shortness of breath).     carvedilol (COREG) 12.5 MG tablet Take 1 tablet (12.5 mg total) by mouth 2 (two) times daily with a meal. 180 tablet 2   rosuvastatin (CRESTOR) 10 MG tablet Take 1 tablet (10 mg total) by mouth daily. 90 tablet 3   torsemide (DEMADEX) 20 MG tablet Take 3 tablets (60 mg total) by mouth daily. 270 tablet 2   No current facility-administered medications for this visit.     Family History    Family History  Problem Relation Age of Onset   CAD Mother    He indicated that the status of his mother is unknown.  Social History    Social History   Socioeconomic History   Marital status: Single    Spouse name: Not on file   Number of children: Not on file   Years of education: Not on file   Highest education level: Not on file  Occupational History   Occupation: retired  Tobacco Use   Smoking status: Never   Smokeless tobacco: Never  Vaping Use   Vaping status: Never Used  Substance and Sexual Activity   Alcohol use: Not Currently   Drug use:  No   Sexual activity: Not Currently  Other Topics Concern   Not on file  Social History Narrative   Not on file   Social Determinants of Health   Financial Resource Strain: Low Risk  (01/11/2021)   Overall Financial Resource Strain (CARDIA)    Difficulty of Paying Living Expenses: Not very hard  Food Insecurity: No Food Insecurity (01/11/2021)   Hunger Vital Sign    Worried About Running Out of Food in the Last Year: Never true    Ran Out of Food in the Last Year: Never true  Transportation Needs: No Transportation Needs (01/11/2021)   PRAPARE - Administrator, Civil Service (Medical): No    Lack of Transportation (Non-Medical): No  Physical Activity: Inactive (06/18/2019)   Exercise Vital Sign     Days of Exercise per Week: 0 days    Minutes of Exercise per Session: 0 min  Stress: Stress Concern Present (06/18/2019)   [employer] of Occupational Health - Occupational Stress Questionnaire    Feeling of Stress : To some extent  Social Connections: Unknown (06/18/2019)   Social Connection and Isolation Panel [NHANES]    Frequency of Communication with Friends and Family: Never    Frequency of Social Gatherings with Friends and Family: Never    Attends Religious Services: Never    Database administrator or Organizations: No    Attends Banker Meetings: Never    Marital Status: Not on file  Intimate Partner Violence: Not At Risk (06/18/2019)   Humiliation, Afraid, Rape, and Kick questionnaire    Fear of Current or Ex-Partner: No    Emotionally Abused: No    Physically Abused: No    Sexually Abused: No     Review of Systems    General:  No chills, fever, night sweats or weight changes.  Cardiovascular:  No chest pain, dyspnea on exertion, edema, orthopnea, palpitations, paroxysmal nocturnal dyspnea. Dermatological: No rash, lesions/masses Respiratory: No cough, dyspnea Urologic: No hematuria, dysuria Abdominal:   No nausea, vomiting, diarrhea, bright red blood per rectum, melena, or hematemesis Neurologic:  No visual changes, wkns, changes in mental status. All other systems reviewed and are otherwise negative except as noted above.       Physical Exam    VS:  BP 120/80 (BP Location: Left Arm, Patient Position: Sitting, Cuff Size: Large)   Pulse 95   Ht 5\' 11"  (1.803 m)   Wt (!) 402 lb (182.3 kg)   BMI 56.07 kg/m  , BMI Body mass index is 56.07 kg/m.     GEN: Well nourished, well developed, in no acute distress.  Morbidly obese HEENT: normal. Neck: Supple, no JVD, carotid bruits, or masses. Cardiac: RRR, distant heart sounds, difficult to auscultate for murmurs, rubs, or gallops. No clubbing, cyanosis, edema.  Radials/DP/PT 2+ and equal bilaterally.   Respiratory:  Respirations regular and unlabored, clear to auscultation bilaterally.  Wearing oxygen via nasal cannula some diminished in the bases, GI: Soft, nontender, nondistended, BS + x 4. MS: no deformity or atrophy.  Bilateral lower extremity keratotic lesions below the knee. Skin: warm and dry, no rash. Neuro:  Strength and sensation are intact. Psych: Normal affect.      Lab Results  Component Value Date   WBC 10.3 10/27/2022   HGB 13.6 10/27/2022   HCT 41.7 10/27/2022   MCV 88 10/27/2022   PLT 186 10/27/2022   Lab Results  Component Value Date   CREATININE 1.53 (H) 10/27/2022  BUN 31 (H) 10/27/2022   NA 143 10/27/2022   K 4.1 10/27/2022   CL 99 10/27/2022   CO2 28 10/27/2022   Lab Results  Component Value Date   ALT 8 10/27/2022   AST 14 10/27/2022   ALKPHOS 53 10/27/2022   BILITOT 0.5 10/27/2022   Lab Results  Component Value Date   CHOL 130 10/27/2022   HDL 41 10/27/2022   LDLCALC 65 10/27/2022   TRIG 134 10/27/2022   CHOLHDL 3.2 10/27/2022    Lab Results  Component Value Date   HGBA1C 5.8 (H) 10/27/2022     Review of Prior Studies    RHC Findings:   RA = 9 RV = 42/13 PA = 38/10 (28) PCW = 13 Fick cardiac output/index = 6.1/2.2 Thermo CO/CI = 8.4/3.0 PVR = 2.5 (Fick), 1.8 (Thermo) Ao sat = 96% PA sat = 64%, 68%   Assessment: 1. Mild PAH (likely OSA/OHS) with normal left-sided filling pressures and outputs   Plan/Discussion:   I will stop IV lasix today. Switch to po per primary team. Consider SGLT2i.    Arvilla Meres, MD   Assessment & Plan   1.  Chronic diastolic CHF: No evidence of volume overload on exam today.  There is no lower extremity edema, there is no abdominal distention.  Although he is occasionally noncompliant with salt he has done well under current medication regimen.  In fact with addition of Jardiance he has lost approximately 10 pounds.  Continue him on carvedilol 12.5 mg twice daily, torsemide 60 mg daily.   Nephrology manages kidney function labs however he is due for annual labs and therefore a CMET will be ordered for cardiology purposes.  2.  Hypercholesterolemia: Remains on rosuvastatin 10 mg daily.  Lipid profile will be ordered along with CMET as discussed.  Last lipid profile completed in February 2024 had good control of cholesterol and LDL.  3.  Chronic kidney disease stage IV: Managed by nephrology.  Patient reports that he has not been notified that he is a candidate for dialysis at this time.         Signed, Bettey Mare. Liborio Nixon, ANP, AACC   06/15/2023 12:58 PM      Office [phone] Fax [phone]  Notice: This dictation was prepared with Dragon dictation along with smaller phrase technology. Any transcriptional errors that result from this process are unintentional and may not be corrected upon review.

## 2023-06-15 ENCOUNTER — Encounter: Payer: Self-pay | Admitting: Adult Health

## 2023-06-15 ENCOUNTER — Ambulatory Visit: Payer: Medicare Other | Attending: Cardiology | Admitting: Adult Health

## 2023-06-15 VITALS — BP 120/80 | HR 95 | Ht 71.0 in | Wt >= 6400 oz

## 2023-06-15 DIAGNOSIS — I5032 Chronic diastolic (congestive) heart failure: Secondary | ICD-10-CM

## 2023-06-15 DIAGNOSIS — N184 Chronic kidney disease, stage 4 (severe): Secondary | ICD-10-CM

## 2023-06-15 DIAGNOSIS — E785 Hyperlipidemia, unspecified: Secondary | ICD-10-CM | POA: Diagnosis not present

## 2023-06-15 DIAGNOSIS — I10 Essential (primary) hypertension: Secondary | ICD-10-CM

## 2023-06-15 DIAGNOSIS — G4733 Obstructive sleep apnea (adult) (pediatric): Secondary | ICD-10-CM

## 2023-06-15 MED ORDER — CARVEDILOL 12.5 MG PO TABS: 12.5000 mg | ORAL_TABLET | Freq: Two times a day (BID) | ORAL | 2 refills | Status: DC

## 2023-06-15 MED ORDER — ROSUVASTATIN CALCIUM 10 MG PO TABS
10.0000 mg | ORAL_TABLET | Freq: Every day | ORAL | 3 refills | Status: AC
Start: 2023-06-15 — End: ?

## 2023-06-15 MED ORDER — TORSEMIDE 20 MG PO TABS: 60.0000 mg | ORAL_TABLET | Freq: Every day | ORAL | 2 refills | Status: AC

## 2023-06-15 NOTE — Patient Instructions (Signed)
Medication Instructions:  No Changes *If you need a refill on your cardiac medications before your next appointment, please call your pharmacy*   Lab Work: CMET, Lipid Panel If you have labs (blood work) drawn today and your tests are completely normal, you will receive your results only by: MyChart Message (if you have MyChart) OR A paper copy in the mail If you have any lab test that is abnormal or we need to change your treatment, we will call you to review the results.   Testing/Procedures: No Testing   Follow-Up: At [facility], you and your health needs are our priority.  As part of our continuing mission to provide you with exceptional heart care, we have created designated Provider Care Teams.  These Care Teams include your primary Cardiologist (physician) and Advanced Practice Providers (APPs -  Physician Assistants and Nurse Practitioners) who all work together to provide you with the care you need, when you need it.  We recommend signing up for the patient portal called "MyChart".  Sign up information is provided on this After Visit Summary.  MyChart is used to connect with patients for Virtual Visits (Telemedicine).  Patients are able to view lab/test results, encounter notes, upcoming appointments, etc.  Non-urgent messages can be sent to your provider as well.   To learn more about what you can do with MyChart, go to [email or web address].    Your next appointment:   6 month(s)  Provider:   Little Ishikawa, MD

## 2023-08-24 DIAGNOSIS — L0390 Cellulitis, unspecified: Secondary | ICD-10-CM | POA: Diagnosis not present

## 2023-08-24 DIAGNOSIS — I5030 Unspecified diastolic (congestive) heart failure: Secondary | ICD-10-CM | POA: Diagnosis not present

## 2023-08-24 DIAGNOSIS — N184 Chronic kidney disease, stage 4 (severe): Secondary | ICD-10-CM | POA: Diagnosis not present

## 2023-08-24 DIAGNOSIS — N189 Chronic kidney disease, unspecified: Secondary | ICD-10-CM | POA: Diagnosis not present

## 2023-08-24 DIAGNOSIS — I129 Hypertensive chronic kidney disease with stage 1 through stage 4 chronic kidney disease, or unspecified chronic kidney disease: Secondary | ICD-10-CM | POA: Diagnosis not present

## 2023-08-24 DIAGNOSIS — N2581 Secondary hyperparathyroidism of renal origin: Secondary | ICD-10-CM | POA: Diagnosis not present

## 2023-12-27 DIAGNOSIS — N2581 Secondary hyperparathyroidism of renal origin: Secondary | ICD-10-CM | POA: Diagnosis not present

## 2023-12-27 DIAGNOSIS — I5030 Unspecified diastolic (congestive) heart failure: Secondary | ICD-10-CM | POA: Diagnosis not present

## 2023-12-27 DIAGNOSIS — G4730 Sleep apnea, unspecified: Secondary | ICD-10-CM | POA: Diagnosis not present

## 2023-12-27 DIAGNOSIS — Z9981 Dependence on supplemental oxygen: Secondary | ICD-10-CM | POA: Diagnosis not present

## 2023-12-27 DIAGNOSIS — N184 Chronic kidney disease, stage 4 (severe): Secondary | ICD-10-CM | POA: Diagnosis not present

## 2023-12-27 LAB — LAB REPORT - SCANNED: EGFR: 41

## 2024-01-25 ENCOUNTER — Encounter: Payer: Self-pay | Admitting: Pulmonary Disease

## 2024-01-25 ENCOUNTER — Ambulatory Visit (INDEPENDENT_AMBULATORY_CARE_PROVIDER_SITE_OTHER): Admitting: Pulmonary Disease

## 2024-01-25 VITALS — BP 92/71 | HR 84 | Ht 71.0 in | Wt 396.2 lb

## 2024-01-25 DIAGNOSIS — G4733 Obstructive sleep apnea (adult) (pediatric): Secondary | ICD-10-CM

## 2024-01-25 DIAGNOSIS — J9611 Chronic respiratory failure with hypoxia: Secondary | ICD-10-CM | POA: Diagnosis not present

## 2024-01-25 DIAGNOSIS — I5032 Chronic diastolic (congestive) heart failure: Secondary | ICD-10-CM | POA: Diagnosis not present

## 2024-01-25 DIAGNOSIS — G4730 Sleep apnea, unspecified: Secondary | ICD-10-CM

## 2024-01-25 NOTE — Progress Notes (Unsigned)
 Aaron Hale    [id number]    1960/11/09  Primary Care Physician:Rankins, Laurette Pool, MD  Referring Physician: Annamarie Kid, MD [address] [place],  [place] [address]  Chief complaint: Follow up for dyspnea, hypoxia  HPI: 63 year old with history of obesity, CHF, hypertension, CKD Evaluation for dyspnea, hypoxia. He had a hospitalization in 2020 for diastolic heart failure treated with diuresis.  He will start on supplemental oxygen  at that time and continues on 2 L oxygen  prescribed adequate diuresis and management of heart failure.  Hence he was sent to [facility] for further evaluation  Complains of mild dyspnea on exertion.  No cough, sputum production, fevers, chills  Pets: Has birds at home.  He had a cockatiel for many years.  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  No mold, hot tub, Jacuzzi.  No down pillows or comforter Smoking history: Never smoker Travel history: Originally from [place] .  Moved to [place] in 2013 Relevant family history: No significant family history of lung disease.  Interim history: Discussed the use of AI scribe software for clinical note transcription with the patient, who gave verbal consent to proceed.  History of Present Illness Aaron Hale is a 63 year old male with severe sleep apnea and chronic hypoxic respiratory failure who presents for a renewal of his BiPAP prescription and evaluation of his oxygen  needs.  He has been using a BiPAP machine since 2021 following a sleep study that diagnosed him with severe sleep apnea. He uses the BiPAP machine regularly, approximately 98% of the days for more than four hours, although he cannot wear it for eight hours straight. He changes the mask parts regularly to prevent leaks and ensure proper fitting. He experiences occasional episodes of waking up choking, which he  attributes to his sleep apnea, but these events are infrequent. No central apnea events are reported.  He has a history of chronic hypoxic respiratory failure and has been on oxygen  therapy since 2021. He is currently on two liters of oxygen  and reports stable kidney function and heart failure, although he has been gaining weight and is on double his previous dose of diuretics. He experiences frequent urination due to the increased diuretic use. No lung issues or COPD. He does not smoke.  He inquires about obtaining a portable oxygen  concentrator for an upcoming trip, as his current portable concentrator displays a 'low purification' message. He mentions difficulty walking without a walker, which may affect his qualification for a new portable concentrator.    Outpatient Encounter Medications as of 01/25/2024  Medication Sig   allopurinol  (ZYLOPRIM ) 100 MG tablet Take 0.5 tablets (50 mg total) by mouth daily. (Patient taking differently: Take 1 mg by mouth daily.)   carvedilol  (COREG ) 12.5 MG tablet Take 1 tablet (12.5 mg total) by mouth 2 (two) times daily with a meal.   empagliflozin (JARDIANCE) 10 MG TABS tablet Take 10 mg by mouth.   omeprazole  (PRILOSEC) 20 MG capsule Take 1 capsule (20 mg total) by mouth daily.   OXYGEN  Inhale 2 L/min into the lungs as needed (for shortness of breath).   rosuvastatin  (CRESTOR ) 10 MG tablet Take 1 tablet (10 mg total) by mouth daily.   torsemide  (DEMADEX ) 20 MG tablet Take 3 tablets (60 mg total) by mouth daily.   No facility-administered encounter medications on file as of 01/25/2024.  Physical Exam: Blood pressure 92/71, pulse 84, height 5\' 11"  (1.803 m), weight (!) 396 lb 3.2 oz (179.7 kg), SpO2 93%. Gen:      No acute distress HEENT:  EOMI, sclera anicteric Neck:     No masses; no thyromegaly Lungs:    Clear to auscultation bilaterally; normal respiratory effort CV:         Regular rate and rhythm; no murmurs Abd:      + bowel sounds; soft,  non-tender; no palpable masses, no distension Ext:    No edema; adequate peripheral perfusion Neuro: alert and oriented x 3 Psych: normal mood and affect   Data Reviewed: Imaging: Chest x-ray 05/03/2019-Cardiomegaly, mild pulmonary vascular congestion.  High-res CT 02/03/2020-no evidence of interstitial lung disease, nonspecific scarring.  Bronchial wall thickening, dilated pulmonary artery, coronary atherosclerosis.   PFTs: 03/31/2020 FVC 2.99 [62%], FEV1 2.34 [54%], F/F 78, TLC 6.97 [99%], DLCO 22.63 [81%] Normal values.  Test stopped early due to dizziness  Labs: CBC 81/20/21-WBC 19.5, eos 2%, absolute eosinophil count 390  Sleep: PSG 05/25/2020 He was tried on CPAP up to 20 cm H2O.  He continued to have obstructive respiratory events, and had difficulty tolerating CPAP at this high a setting. - He was transitioned to Bipap and optimal setting was Bipap 22/18 cm H2O. - He did not require use of supplemental oxygen  during this study.   Assessment:  Hypoxic respiratory failure, dyspnea He has history of diastolic heart failure.  Still has mild lower extremity edema but diuresis limited by CKD. I am not sure if he has significant underlying lung disease but given history of bird exposure we had evaluated him High-res CT with no significant interstitial lung disease. PFTs reviewed with reduction in ERV secondary to obesity.  However there is no overt obstruction, restriction or diffusion impairment.  Suspected OSA Suspected given body habitus, snoring. Finished split-night sleep study yesterday.  Await results  Health maintenance We discussed COVID-19 vaccine.  He asked about risks of getting it.  I encouraged him to get it as he has significant underlying health issues and would not do well if he contracted Covid.  Plan/Recommendations: Follow split-night sleep study  Phyllis Breeze MD [facility] 01/25/2024, 8:34 AM  CC: Rankins, Laurette Pool, MD

## 2024-01-25 NOTE — Patient Instructions (Addendum)
 VISIT SUMMARY:  Today, you came in for a renewal of your BiPAP prescription and to evaluate your oxygen  needs. We discussed your ongoing management of severe sleep apnea and chronic hypoxic respiratory failure, as well as your chronic diastolic heart failure and stage 4 chronic kidney disease.  YOUR PLAN:  -SEVERE SLEEP APNEA: Severe sleep apnea is a condition where your breathing repeatedly stops and starts during sleep. Your BiPAP therapy is effectively managing this condition with inspiratory pressure set at 22 and expiratory pressure at 18. We will renew your BiPAP prescription with the current settings and refer you to a sleep specialist for ongoing management.  -CHRONIC HYPOXIC RESPIRATORY FAILURE: Chronic hypoxic respiratory failure is a long-term condition where your body doesn't get enough oxygen . You are currently using supplemental oxygen  at 2 liters per minute. We will document your oxygen  levels at rest without supplemental oxygen  and submit an order to the DME company for an assessment and potential replacement of your portable concentrator.   -CHRONIC DIASTOLIC HEART FAILURE: Chronic diastolic heart failure is a condition where the heart has difficulty relaxing and filling with blood. Your condition is well-managed, but you have reported weight gain and increased use of diuretics. Please continue regular follow-ups with your cardiologist, Dr. Alda Amas.  -CHRONIC KIDNEY DISEASE, STAGE 4: Chronic kidney disease, stage 4, means your kidneys are significantly damaged and not working as well as they should. Please continue regular follow-ups with your nephrologist, Dr. Christianne Cowper.  INSTRUCTIONS:  Please schedule a follow-up appointment at least once a year and coordinate with the sleep specialist for ongoing management.

## 2024-03-03 ENCOUNTER — Telehealth: Payer: Self-pay

## 2024-03-03 NOTE — Telephone Encounter (Signed)
 Received CMN from [facility].  Spoke to patient to confirm DME. He stated that he is currently with [facility].  CMN will be faxed back to [facility] to reach out  to [employer].

## 2024-03-13 ENCOUNTER — Telehealth: Payer: Self-pay

## 2024-03-13 NOTE — Telephone Encounter (Signed)
 Encounter was routed to [facility] in error.

## 2024-03-13 NOTE — Telephone Encounter (Signed)
 Order has been signed.

## 2024-03-13 NOTE — Telephone Encounter (Signed)
 Copied from CRM [id number]. Topic: Clinical - Order For Equipment >> Mar 11, 2024 12:59 PM Whitney O wrote:  Reason for CRM: patient sister is calling because he is needi g a refil on his oxygen  supplies his concentrator bipass supplies and also the portable oxygen  unit orders . They got the order from the doctor but its not signed so they want do anything until this is signed and they said they have sent order to the office a couple of times but dont know what's going on . Wondering if we can get this completed today

## 2024-03-18 NOTE — Telephone Encounter (Signed)
 Spoke to [place] with [employer]. He stated that patient is now with [facility].  Lm for Sarah with [facility] to verify that there received CMN.

## 2024-03-19 NOTE — Telephone Encounter (Signed)
 Reason for CRM: Lauraine with [facility] is calling back Anish Vana to let her know the CMN has not been received yet. Her call back phone number is [phone].   Lm for Sarah with [facility]. I have requested that she re fax CMN to my direct fax.

## 2024-03-20 NOTE — Telephone Encounter (Signed)
 CMN has been received and placed in Dr. Theophilus folder for signature.

## 2024-03-25 ENCOUNTER — Ambulatory Visit: Attending: Cardiology | Admitting: Emergency Medicine

## 2024-03-25 ENCOUNTER — Encounter: Payer: Self-pay | Admitting: Emergency Medicine

## 2024-03-25 VITALS — BP 112/70 | HR 77 | Ht 71.0 in | Wt 396.0 lb

## 2024-03-25 DIAGNOSIS — E785 Hyperlipidemia, unspecified: Secondary | ICD-10-CM

## 2024-03-25 DIAGNOSIS — I5032 Chronic diastolic (congestive) heart failure: Secondary | ICD-10-CM

## 2024-03-25 DIAGNOSIS — I10 Essential (primary) hypertension: Secondary | ICD-10-CM

## 2024-03-25 DIAGNOSIS — I447 Left bundle-branch block, unspecified: Secondary | ICD-10-CM

## 2024-03-25 DIAGNOSIS — N183 Chronic kidney disease, stage 3 unspecified: Secondary | ICD-10-CM | POA: Diagnosis not present

## 2024-03-25 DIAGNOSIS — I2510 Atherosclerotic heart disease of native coronary artery without angina pectoris: Secondary | ICD-10-CM

## 2024-03-25 DIAGNOSIS — Z79899 Other long term (current) drug therapy: Secondary | ICD-10-CM | POA: Diagnosis not present

## 2024-03-25 DIAGNOSIS — I440 Atrioventricular block, first degree: Secondary | ICD-10-CM | POA: Diagnosis not present

## 2024-03-25 DIAGNOSIS — E782 Mixed hyperlipidemia: Secondary | ICD-10-CM | POA: Diagnosis not present

## 2024-03-25 DIAGNOSIS — R0602 Shortness of breath: Secondary | ICD-10-CM | POA: Diagnosis not present

## 2024-03-25 DIAGNOSIS — G4733 Obstructive sleep apnea (adult) (pediatric): Secondary | ICD-10-CM | POA: Diagnosis not present

## 2024-03-25 MED ORDER — DAPAGLIFLOZIN PROPANEDIOL 10 MG PO TABS: 10.0000 mg | ORAL_TABLET | Freq: Every day | ORAL | 1 refills | Status: AC

## 2024-03-25 NOTE — Patient Instructions (Signed)
 Medication Instructions:  NO CHANGES   Lab Work: FASTING LIPID PANEL AND LTFs TO BE DONE TODAY.   Testing/Procedures: NONE  Follow-Up: At [facility], you and your health needs are our priority.  As part of our continuing mission to provide you with exceptional heart care, our providers are all part of one team.  This team includes your primary Cardiologist (physician) and Advanced Practice Providers or APPs (Physician Assistants and Nurse Practitioners) who all work together to provide you with the care you need, when you need it.  Your next appointment:   6 month(s)  Provider:   Lonni LITTIE Nanas, MD OR Lum Louis, [place]

## 2024-03-25 NOTE — Progress Notes (Signed)
 Cardiology Office Note:    Date:  03/25/2024  ID:  Aaron Hale, DOB 11/11/1960, MRN [id number] PCP: No primary care provider on file.  [facility] Providers Cardiologist:  Lonni LITTIE Nanas, MD       Patient Profile:       Chief Complaint: 48-month follow-up History of Present Illness:  Aaron Hale is a 63 y.o. male with visit-pertinent history of HFpEF, hypertension, CKD stage IV, OSA on BiPAP, hyperlipidemia, morbid obesity  TTE on 04/2019 showed normal LV systolic function, moderate LVH, grade 1 DD, normal RV function, no significant valvular disease.  He had been started on torsemide  at that time and lost 60 pounds over 6 months.  He established with cardiology service on 11/18/2019, at that time spironolactone was discontinued given his CKD stage IV.  BMP checked today showed potassium of 6.  His losartan was also discontinued.  Repeat BMP on 11/21/2019 showed improvement to potassium 5.5.  Subsequently had follow-up with nephrology.  He was admitted in May 2022 for diastolic heart failure and respiratory failure after being found hypoxic at his nephrology clinic visit.  Also treated for lower extremity cellulitis.  He was diuresed with IV Lasix .  Echocardiogram on 01/06/2021 showed EF 55 to 60%, RV poorly visualized, no valvular disease seen.  RHC 01/11/2021 showed RA 9, RV 42/13, PA 38/10/28, PW 13, PA sat 64%.  He was discharged on p.o. Lasix  40 mg twice daily.  Creatinine 1.8 at the time of discharge, discharge weight was 179 kg.  He was last seen in office on 06/15/2023 by Lamarr, NP.  He was noted to be recently placed on Jardiance 10 mg daily by his nephrologist.  He had recently not been very compliant with low-sodium dieting.  On exam he was euvolemic and his GDMT was continued.   Discussed the use of AI scribe software for clinical note transcription with the patient, who gave verbal consent to proceed.  History of Present Illness Aaron Hale is a 63 year old male  with diastolic heart failure who presents for routine follow-up.  Today patient is without acute cardiovascular concerns or complaints.  He denies any chest pains or exertional symptoms.  He experiences intermittent shortness of breath, particularly with exertion, and is on chronic oxygen  therapy at two liters per minute. His weight has decreased from 404 pounds in 2023 to 396 pounds currently, the lowest in the past two years. No issues with BiPAP usage. No cough, fevers, chills, or worsening leg swelling. He dislikes wearing compression stockings for chronic leg swelling.  Denies any orthopnea, PND.  He is currently taking torsemide  60 mg twice a day, increased in May 2025 by nephrology due to fluid retention concerns.   He is not very active, primarily watching TV and going to the bathroom, and only leaves the house for doctor's appointments. He finds it challenging to adhere to his diet, describing it as a 'project' and admitting to occasional lapses.  Review of systems:  Please see the history of present illness. All other systems are reviewed and otherwise negative.      Studies Reviewed:    EKG Interpretation Date/Time:  Tuesday March 25 2024 14:06:04 EDT Ventricular Rate:  77 PR Interval:  262 QRS Duration:  130 QT Interval:  398 QTC Calculation: 450 R Axis:   -72  Text Interpretation: Sinus rhythm with 1st degree A-V block Left axis deviation Artifact Left bundle branch block When compared with ECG of 25-Mar-2024 14:04, No significant change since last  tracing Confirmed by Jeffrie Anes ([id number]) on 03/25/2024 3:37:53 PM    Right heart catheterization 01/11/2021 Findings:   RA = 9 RV = 42/13 PA = 38/10 (28) PCW = 13 Fick cardiac output/index = 6.1/2.2 Thermo CO/CI = 8.4/3.0 PVR = 2.5 (Fick), 1.8 (Thermo) Ao sat = 96% PA sat = 64%, 68%   Assessment: 1. Mild PAH (likely OSA/OHS) with normal left-sided filling pressures and outputs  Echocardiogram 01/06/2021  1. Left  ventricular ejection fraction, by estimation, is 55 to 60%. The  left ventricle has normal function. Left ventricular endocardial border  not optimally defined to evaluate regional wall motion. There is not well  visualized left ventricular  hypertrophy. Left ventricular diastolic parameters are indeterminate. Very  technically difficult study with globally preserved LV function.   2. Right ventricular systolic function was not well visualized. The right  ventricular size is not well visualized.   3. The mitral valve was not well visualized. No evidence of mitral valve  regurgitation.   4. The aortic valve was not well visualized. Aortic valve regurgitation  is not visualized.   5. The inferior vena cava is dilated in size with <50% respiratory  variability, suggesting right atrial pressure of 15 mmHg.   Echocardiogram 05/04/2019 1. The left ventricle has normal systolic function with an ejection  fraction of 60-65%. The cavity size was normal. There is moderately  increased left ventricular wall thickness. Left ventricular diastolic  Doppler parameters are consistent with impaired   relaxation. No evidence of left ventricular regional wall motion  abnormalities.   2. The right ventricle has normal systolic function. The cavity was  normal. There is no increase in right ventricular wall thickness.   3. The mitral valve is grossly normal.   4. The tricuspid valve is grossly normal.   5. The aortic valve is tricuspid. Aortic valve regurgitation was not  assessed by color flow Doppler.   6. The aorta is normal unless otherwise noted.   Risk Assessment/Calculations:              Physical Exam:   VS:  BP 112/70 (BP Location: Left Arm, Patient Position: Sitting, Cuff Size: Large)   Pulse 77   Ht 5' 11 (1.803 m)   Wt (!) 396 lb (179.6 kg)   BMI 55.23 kg/m    Wt Readings from Last 3 Encounters:  03/25/24 (!) 396 lb (179.6 kg)  01/25/24 (!) 396 lb 3.2 oz (179.7 kg)  06/15/23 (!)  402 lb (182.3 kg)    GEN: Well nourished, well developed in no acute distress NECK: No JVD; No carotid bruits CARDIAC: RRR, no murmurs, rubs, gallops RESPIRATORY:  Clear to auscultation without rales, wheezing or rhonchi  ABDOMEN: Soft, non-tender, non-distended EXTREMITIES: 1+ bilateral lower extremity; No acute deformity      Assessment and Plan:  HFpEF Echocardiogram 01/2021 with LVEF 55 to 60% and indeterminate diastolic parameters RHC 01/2021 with mild PAH (likely OSA/OHS) with normal left sided filling pressures and outputs Has been on 2L O2 chronically, has been attributed to HFpEF which may be contributing though he only had mild diastolic dysfunction on TTE.  There was concern for underlying pulmonology disease, seen by pulmonary with unremarkable CT chest and PFTs.  Underwent sleep study that showed severe OSA and started on BiPAP Nephrology increased torsemide  to 60 mg twice daily on 12/2023 due to fluid retention  - Weight today is 396 pounds down from 402 pounds - Today there is no evidence of volume overload  on exam and appears euvolemic -  Continue carvedilol  12.5 mg twice daily, Farxiga  10 mg daily, torsemide  60 mg twice daily - Focus on weight loss and heart healthy dieting  Hypertension Blood pressure today well-controlled at 112/70 - Continue carvedilol  12.5 mg twice daily  CKD stage III Creatinine 1.84 and GFR 41 on 12/2023 - Managed by Dr. Gearline with nephrology  Obstructive sleep apnea History of severe OSA and on BiPAP and remains adherent  Hyperlipidemia, LDL goal <70 LDL 65 on 10/2022 and well-controlled - Continue rosuvastatin  10 mg daily - Repeat lipid panel/LFTs today  Coronary artery calcification Aortic atherosclerosis CT chest 02/2020 showed three-vessel coronary atherosclerosis and aortic atherosclerosis EKG today without acute ischemic changes - Today patient is without anginal symptoms, no indication for further ischemic evaluation at this time -  Continue rosuvastatin  10 mg daily      Dispo:  Return in about 6 months (around 09/25/2024).  Signed, Lum LITTIE Louis, NP

## 2024-03-26 ENCOUNTER — Ambulatory Visit: Payer: Self-pay | Admitting: *Deleted

## 2024-03-26 ENCOUNTER — Telehealth: Payer: Self-pay

## 2024-03-26 DIAGNOSIS — I129 Hypertensive chronic kidney disease with stage 1 through stage 4 chronic kidney disease, or unspecified chronic kidney disease: Secondary | ICD-10-CM | POA: Diagnosis not present

## 2024-03-26 DIAGNOSIS — N2581 Secondary hyperparathyroidism of renal origin: Secondary | ICD-10-CM | POA: Diagnosis not present

## 2024-03-26 DIAGNOSIS — N184 Chronic kidney disease, stage 4 (severe): Secondary | ICD-10-CM | POA: Diagnosis not present

## 2024-03-26 DIAGNOSIS — I5030 Unspecified diastolic (congestive) heart failure: Secondary | ICD-10-CM | POA: Diagnosis not present

## 2024-03-26 DIAGNOSIS — Z9981 Dependence on supplemental oxygen: Secondary | ICD-10-CM | POA: Diagnosis not present

## 2024-03-26 LAB — LIPID PANEL
Chol/HDL Ratio: 3.1 ratio (ref 0.0–5.0)
Cholesterol, Total: 120 mg/dL (ref 100–199)
HDL: 39 mg/dL — ABNORMAL LOW (ref >39)
LDL Chol Calc (NIH): 58 mg/dL (ref 0–99)
Triglycerides: 128 mg/dL (ref 0–149)
VLDL Cholesterol Cal: 23 mg/dL (ref 5–40)

## 2024-03-26 LAB — HEPATIC FUNCTION PANEL
ALT: 10 IU/L (ref 0–44)
AST: 14 IU/L (ref 0–40)
Albumin: 4.4 g/dL (ref 3.9–4.9)
Alkaline Phosphatase: 54 IU/L (ref 44–121)
Bilirubin Total: 0.5 mg/dL (ref 0.0–1.2)
Bilirubin, Direct: 0.22 mg/dL (ref 0.00–0.40)
Total Protein: 7.6 g/dL (ref 6.0–8.5)

## 2024-03-26 NOTE — Telephone Encounter (Addendum)
 Copied from CRM [id number]. Topic: Clinical - Order For Equipment >> Mar 24, 2024 11:53 AM Corean SAUNDERS wrote: Reason for CRM: Jolynn from [facility] is faxing requests for new prescriptions for a portable oxygen  concentrator and other CPAP supplies for Dr. Theophilus to review and sign with office notes attached. >> Mar 26, 2024 12:38 PM Corean SAUNDERS wrote: Jolynn from [facility] states they received a fax back that just said ([facility]) but Jolynn states she confirmed that [employer] does not have the valid orders for CPAP or oxygen . Patient is requesting the provider to just sign the request and fax it back please.   CMN paperwork is in Dr. Kai folder in A POD.  He will be back in clinic on 04/01/2024 and will address this then.

## 2024-03-27 NOTE — Progress Notes (Signed)
 Order(s) created erroneously. Erroneous order ID: [id number]  Order moved by: CHART CORRECTION ANALYST NINE, IDENTITY  Order move date/time: 03/27/2024 7:49 AM  Source Patient: [id number]  Source Contact: 03/25/2024  Destination Patient: [id number]  Destination Contact: 02/14/2023  Erroneous order ID: [id number]  Order moved by: CHART CORRECTION ANALYST NINE, IDENTITY  Order move date/time: 03/27/2024 7:49 AM  Source Patient: [id number]  Source Contact: 03/25/2024  Destination Patient: [id number]  Destination Contact: 02/14/2023  Erroneous order ID: [id number]  Order moved by: CHART CORRECTION ANALYST NINE, IDENTITY  Order move date/time: 03/27/2024 7:49 AM  Source Patient: [id number]  Source Contact: 03/25/2024  Destination Patient: [id number]  Destination Contact: 02/14/2023

## 2024-03-31 ENCOUNTER — Telehealth: Payer: Self-pay | Admitting: *Deleted

## 2024-03-31 NOTE — Telephone Encounter (Signed)
 [facility]plicate encounter

## 2024-04-01 NOTE — Telephone Encounter (Signed)
 Copied from CRM [id number]. Topic: Clinical - Order For Equipment >> Mar 24, 2024 11:53 AM Corean SAUNDERS wrote: Reason for CRM: Jolynn from [facility] is faxing requests for new prescriptions for a portable oxygen  concentrator and other CPAP supplies for Dr. Theophilus to review and sign with office notes attached. >> Mar 28, 2024  1:40 PM Joesph PARAS wrote: [facility] and patient conference calling in to inquire about this. Informed paperwork and request are currently with Dr. Theophilus, and provider will return to office on 07/29. >> Mar 28, 2024 12:52 PM Russell PARAS wrote: Harlene, with [facility], is contacting clinic regarding faxed request that was sent on 07/24. Reviewed chart and did not see request scanned in. Advised they may have not scanned in the request as of yet. She will contact clinic Monday to follow up. Verified fax # >> Mar 26, 2024 12:38 PM Corean SAUNDERS wrote: Jolynn from [facility] states they received a fax back that just said ([facility]) but Jolynn states she confirmed that [employer] does not have the valid orders for CPAP or oxygen . Patient is requesting the provider to just sign the request and fax it back please.  [facility] CMA is working with Dr. Theophilus Did this get signed? Routing to [facility] to follow up on this

## 2024-04-03 NOTE — Telephone Encounter (Signed)
CMN completed and faxed.

## 2024-04-08 NOTE — Telephone Encounter (Signed)
 CPAP order signed and faxed to [facility]. Received fax confirmation.

## 2024-07-21 ENCOUNTER — Other Ambulatory Visit: Payer: Self-pay | Admitting: Adult Health

## 2024-08-19 ENCOUNTER — Other Ambulatory Visit: Payer: Self-pay | Admitting: Adult Health
